# Patient Record
Sex: Female | Born: 1975
Health system: Southern US, Community
[De-identification: ages and names within clinical notes are randomized; demographics above are authoritative.]

## PROBLEM LIST (undated history)

## (undated) ENCOUNTER — Ambulatory Visit (HOSPITAL_COMMUNITY): Admission: EM | Payer: 59 | Source: Home / Self Care

## (undated) DIAGNOSIS — Z973 Presence of spectacles and contact lenses: Secondary | ICD-10-CM

## (undated) DIAGNOSIS — R0600 Dyspnea, unspecified: Secondary | ICD-10-CM

## (undated) DIAGNOSIS — Z87440 Personal history of urinary (tract) infections: Secondary | ICD-10-CM

## (undated) DIAGNOSIS — E221 Hyperprolactinemia: Secondary | ICD-10-CM

## (undated) DIAGNOSIS — Z8669 Personal history of other diseases of the nervous system and sense organs: Secondary | ICD-10-CM

## (undated) DIAGNOSIS — Z87898 Personal history of other specified conditions: Secondary | ICD-10-CM

## (undated) DIAGNOSIS — G43909 Migraine, unspecified, not intractable, without status migrainosus: Secondary | ICD-10-CM

## (undated) DIAGNOSIS — R0609 Other forms of dyspnea: Secondary | ICD-10-CM

## (undated) DIAGNOSIS — D352 Benign neoplasm of pituitary gland: Secondary | ICD-10-CM

## (undated) DIAGNOSIS — E7800 Pure hypercholesterolemia, unspecified: Secondary | ICD-10-CM

## (undated) DIAGNOSIS — I10 Essential (primary) hypertension: Secondary | ICD-10-CM

## (undated) DIAGNOSIS — Z87442 Personal history of urinary calculi: Secondary | ICD-10-CM

---

## 2013-06-13 ENCOUNTER — Emergency Department (HOSPITAL_COMMUNITY)
Admission: EM | Admit: 2013-06-13 | Discharge: 2013-06-13 | Disposition: A | Discharge status: Home / Self Care | Payer: Self-pay | Attending: Emergency Medicine | Admitting: Emergency Medicine

## 2013-06-13 ENCOUNTER — Encounter (HOSPITAL_COMMUNITY): Payer: Self-pay | Admitting: Emergency Medicine

## 2013-06-13 DIAGNOSIS — Z8669 Personal history of other diseases of the nervous system and sense organs: Secondary | ICD-10-CM | POA: Insufficient documentation

## 2013-06-13 DIAGNOSIS — Z8639 Personal history of other endocrine, nutritional and metabolic disease: Secondary | ICD-10-CM | POA: Insufficient documentation

## 2013-06-13 DIAGNOSIS — G43909 Migraine, unspecified, not intractable, without status migrainosus: Secondary | ICD-10-CM | POA: Insufficient documentation

## 2013-06-13 DIAGNOSIS — Z862 Personal history of diseases of the blood and blood-forming organs and certain disorders involving the immune mechanism: Secondary | ICD-10-CM | POA: Insufficient documentation

## 2013-06-13 DIAGNOSIS — R112 Nausea with vomiting, unspecified: Secondary | ICD-10-CM

## 2013-06-13 DIAGNOSIS — F17200 Nicotine dependence, unspecified, uncomplicated: Secondary | ICD-10-CM | POA: Insufficient documentation

## 2013-06-13 HISTORY — DX: Pure hypercholesterolemia, unspecified: E78.00

## 2013-06-13 HISTORY — DX: Migraine, unspecified, not intractable, without status migrainosus: G43.909

## 2013-06-13 MED ORDER — TRAMADOL HCL 50 MG PO TABS: 50.0000 mg | ORAL_TABLET | Freq: Four times a day (QID) | ORAL | Status: DC | PRN

## 2013-06-13 MED ORDER — PROCHLORPERAZINE EDISYLATE 5 MG/ML IJ SOLN
10.0000 mg | Freq: Once | INTRAMUSCULAR | Status: AC
  Administered 2013-06-13: 10 mg via INTRAVENOUS
  Filled 2013-06-13: qty 2

## 2013-06-13 MED ORDER — DIPHENHYDRAMINE HCL 50 MG/ML IJ SOLN
25.0000 mg | Freq: Once | INTRAMUSCULAR | Status: AC
  Administered 2013-06-13: 25 mg via INTRAVENOUS
  Filled 2013-06-13: qty 1

## 2013-06-13 MED ORDER — PROMETHAZINE HCL 25 MG PO TABS
25.0000 mg | ORAL_TABLET | Freq: Four times a day (QID) | ORAL | Status: DC | PRN
Start: 2013-06-13 — End: 2014-02-13

## 2013-06-13 MED ORDER — SODIUM CHLORIDE 0.9 % IV BOLUS (SEPSIS)
1000.0000 mL | INTRAVENOUS | Status: AC
  Administered 2013-06-13: 1000 mL via INTRAVENOUS

## 2013-06-13 MED ORDER — ACETAMINOPHEN 500 MG PO TABS
1000.0000 mg | ORAL_TABLET | Freq: Once | ORAL | Status: AC
  Administered 2013-06-13: 1000 mg via ORAL
  Filled 2013-06-13: qty 2

## 2013-06-13 NOTE — ED Notes (Signed)
Pt has been suffering from migraines for the last couple of months and today has been vomiting.  Pt has been diagnosed with migraines and then they stopped 8-9 months ago and then they restarted around November after moving from [place].

## 2013-06-13 NOTE — ED Notes (Signed)
Blurry vision since Saturday nite

## 2013-06-13 NOTE — ED Provider Notes (Signed)
Medical screening examination/treatment/procedure(s) were performed by non-physician practitioner and as supervising physician I was immediately available for consultation/collaboration.  EKG Interpretation   None         Wandra Arthurs, MD 06/13/13 [phone]

## 2013-06-13 NOTE — ED Provider Notes (Signed)
CSN: [id number]     Arrival date & time 06/13/13  1445 History   First MD Initiated Contact with Patient 06/13/13 1723     Chief Complaint  Patient presents with  . Migraine   (Consider location/radiation/quality/duration/timing/severity/associated sxs/prior Treatment) HPI Pt is a 38yo female with hx of migraines c/o 2 day hx of gradually worsening headache associated with nausea and vomiting that started today.  States HA feels like a typical migraine, aching throbbing, right frontal area, 10/10 worse with light but states she has tried 1000mg  ibuprofen as well as BC powder w/o relief.  States she use to live in [place] where her migraines were managed by her PCP, moved to [place] and did not have any of her "HA" medication but migaines were not a problem for 8-25mo, now she recently moved to [place] area and headaches have started back up again.  Reports increased frequency in migraines since November 2014.  States she also saw another specialist in [place] who drew blood, which showed increased prolactin levels so pt was placed on cholesterol medication as well as medication to help restart her menstrual cycles but states she has not had that medication since she moved either.  Denies head trauma, recent illness, fever, sick contacts or recent travel. No other significant PMH.  Past Medical History  Diagnosis Date  . Migraines   . Hypercholesterolemia   . Seizures     r/t biopsy   Past Surgical History  Procedure Laterality Date  . Cesarean section    . Tubal ligation     No family history on file. History  Substance Use Topics  . Smoking status: Current Every Day Smoker  . Smokeless tobacco: Not on file  . Alcohol Use: No   OB History   Grav Para Term Preterm Abortions TAB SAB Ect Mult Living                 Review of Systems  Constitutional: Negative for fever and chills.  Eyes: Positive for photophobia. Negative for pain and visual disturbance.  Respiratory: Negative for cough and  shortness of breath.   Cardiovascular: Negative for chest pain.  Gastrointestinal: Positive for nausea and vomiting. Negative for abdominal pain, diarrhea and constipation.  Neurological: Positive for headaches. Negative for dizziness, syncope, weakness, light-headedness and numbness.  All other systems reviewed and are negative.    Allergies  Percocet  Home Medications   Current Outpatient Rx  Name  Route  Sig  Dispense  Refill  . Aspirin-Salicylamide-Caffeine (BC HEADACHE POWDER PO)   Oral   Take 1 packet by mouth daily as needed (headache).         . IBUPROFEN PO   Oral   Take 200-800 mg by mouth every 4 (four) hours as needed (headaches).         . promethazine (PHENERGAN) 25 MG tablet   Oral   Take 1 tablet (25 mg total) by mouth every 6 (six) hours as needed for nausea or vomiting.   12 tablet   0   . traMADol (ULTRAM) 50 MG tablet   Oral   Take 1 tablet (50 mg total) by mouth every 6 (six) hours as needed.   15 tablet   0    BP 123/81  Pulse 82  Temp(Src) 97.9 F (36.6 C) (Oral)  Resp 18  SpO2 99% Physical Exam  Nursing note and vitals reviewed. Constitutional: She is oriented to person, place, and time. She appears well-developed and well-nourished. No distress.  Pt lying comfortably in exam bed, NAD.   HENT:  Head: Normocephalic and atraumatic.  Eyes: Conjunctivae and EOM are normal. Pupils are equal, round, and reactive to light. Right eye exhibits no discharge. Left eye exhibits no discharge. No scleral icterus.  Neck: Normal range of motion. Neck supple.  Cardiovascular: Normal rate, regular rhythm and normal heart sounds.   Pulmonary/Chest: Effort normal and breath sounds normal. No respiratory distress. She has no wheezes. She has no rales. She exhibits no tenderness.  Abdominal: Soft. Bowel sounds are normal. She exhibits no distension and no mass. There is no tenderness. There is no rebound and no guarding.  Musculoskeletal: Normal range of  motion.  Neurological: She is alert and oriented to person, place, and time. She has normal strength. No cranial nerve deficit or sensory deficit. Coordination normal. GCS eye subscore is 4. GCS verbal subscore is 5. GCS motor subscore is 6.  CN II-XII in tact, no focal deficit, nl finger to nose coordination. Nl sensation, 5/5 strength in all major muscle groups. Neg romberg and nl gait.  Skin: Skin is warm and dry. She is not diaphoretic.    ED Course  Procedures (including critical care time) Labs Review Labs Reviewed - No data to display Imaging Review No results found.  EKG Interpretation   None       MDM   1. Migraine   2. Nausea & vomiting    Pt is a 38yo female with hx of migraines c/o HA that has gradually worsened over last 2 days. States it feels like a typical migraine but no relief with OTC medications.  Neuro exam: unremarkable. Do not believe imaging or labs needed at this time. Will tx with migraine cocktail: acetaminophen, fluids, compazine, and benadryl then reassess.   6:39 PM Pt sleeping but easily awakened. States she feels comfortable being discharged home. Will provided community resource guide as pt is new to the area.  Rx: phenergan and tramadol.  Migraine info packet provided. Return precautions provided. Pt verbalized understanding and agreement with tx plan.    Noland Fordyce, PA-C 06/13/13 [phone]

## 2013-06-13 NOTE — Discharge Instructions (Signed)
°Emergency Department Resource Guide °1) Find a Doctor and Pay Out of Pocket °Although you won't have to find out who is covered by your insurance plan, it is a good idea to ask around and get recommendations. You will then need to call the office and see if the doctor you have chosen will accept you as a new patient and what types of options they offer for patients who are self-pay. Some doctors offer discounts or will set up payment plans for their patients who do not have insurance, but you will need to ask so you aren't surprised when you get to your appointment. ° °2) Contact Your Local Health Department °Not all health departments have doctors that can see patients for sick visits, but many do, so it is worth a call to see if yours does. If you don't know where your local health department is, you can check in your phone book. The CDC also has a tool to help you locate your state's health department, and many state websites also have listings of all of their local health departments. ° °3) Find a Walk-in Clinic °If your illness is not likely to be very severe or complicated, you may want to try a walk in clinic. These are popping up all over the country in pharmacies, drugstores, and shopping centers. They're usually staffed by nurse practitioners or physician assistants that have been trained to treat common illnesses and complaints. They're usually fairly quick and inexpensive. However, if you have serious medical issues or chronic medical problems, these are probably not your best option. ° °No Primary Care Doctor: °- Call [facility] at  832-8000 - they can help you locate a primary care doctor that  accepts your insurance, provides certain services, etc. °- Physician Referral Service- 1-800-533-3463 ° °Chronic Pain Problems: °Organization         Address  Phone   Notes  °[facility]  (336) 297-2271 Patients need to be referred by their primary care doctor.  ° °Medication  Assistance: °Organization         Address  Phone   Notes  °[place] [facility] [address] °[place], [place] [address] (336) 641-8030 --Must be a resident of [place] °-- Must have NO insurance coverage whatsoever (no Medicaid/ Medicare, etc.) °-- The pt. MUST have a primary care doctor that directs their care regularly and follows them in the community °  °[facility]  (866) 331-1348   °[employer]  (888) 892-1162   ° °Agencies that provide inexpensive medical care: °Organization         Address  Phone   Notes  °[facility]  (336) 832-8035   °[facility]    (336) 832-7272   °[facility] [address] °[place], [place] [address] (336) 832-4777   °[facility] [address], °[place] (336) 271-4999   °[facility]    (336) 373-0678   °[facility]    (336) 272-1050   °[facility] ° [address], [place] Phone:  (336) 832-4444, Fax:  (336) 832-4440 Hours of Operation:  9 am - 6 pm, M-F.  Also accepts Medicaid/Medicare and self-pay.  °[facility] ° [address],  Phone: (336) 832-3150, Fax: (336) 832-3151. Hours of Operation:  8:30 am - 5:30 pm, M-F.  Also accepts Medicaid and self-pay.  °[facility] [place] [address], [place] Phone: (336) 878-6027   °[facility] [address], [place], [place] (336)723-1848, Ext. 123 Mondays & Thursdays: 7-9 AM.  First 15 patients are seen on a first come, first serve basis. °  ° °Medicaid-accepting [place] Providers: ° °Organization         Address  Phone   Notes  °[facility] [address], [place] (336) 641-2100 Also accepts self-pay patients.  °[facility] [address], [place] ° (336) 856-9996   °[facility] [address], [place]  (336) 288-8857   °[facility] [address] [place] [address], [place] (336) 299-7000   °Veita Bland [address], [place]  ° (336) 373-1557 Only accepts [place] Access Medicaid patients after they have their name applied to their card.  ° °Self-Pay (no insurance) in [place]: ° °Organization         Address  Phone   Notes  °Sickle Cell Patients, [facility] [address], [place] (336) 832-1970   °[facility] [address], [place] (336) 832-4400   °[facility] [place] ° [address], [place] (336) 992-4800   °[facility]/Dr. Osei-Bonsu ° [address] [place] [address], [place] or [address], [place] (336) 841-8500 Phone number for both [place] and [place] locations is the same.  °[facility] [address], [place] (336) 299-0000   °[facility] [address] [place] [address], [place] or [address] (336) 852-7530 °(336) 878-2260   °[facility] [address], [place] (336) 350-1642, phone; (336) 294-5005, fax Sees patients 1st and 3rd Saturday of every month.  Must not qualify for public or private insurance (i.e. Medicaid, Medicare, [place] Health Choice, Veterans' Benefits) • Household income should be no more than 200% of the poverty level •The clinic cannot treat you if you are pregnant or think you are pregnant • Sexually transmitted diseases are not treated at the clinic.  ° ° °Dental Care: °Organization         Address  Phone  Notes  °[place] [facility] [address], [place] (336) 641-6152 Accepts children up to age 21 who are enrolled in Medicaid or [place] Health Choice; pregnant women with a Medicaid card; and children who have applied for Medicaid or [place] Health Choice, but were declined, whose parents can pay a reduced fee at time of service.  °[place]  [facility] [place]  [address], [place] (336) 641-7733 Accepts children up to age 21 who are enrolled in Medicaid or [place] Health Choice; pregnant women with a Medicaid card; and children who have applied for Medicaid or  Health Choice, but were declined, whose parents can pay a reduced fee at time of service.  °[facility] ° [address], [place] (336) 641-4533 Patients are seen by appointment only. Walk-ins are not accepted. [facility] will see patients 18 years of age and older. °Monday - Tuesday (8am-5pm) °Most Wednesdays (8:30-5pm) °$30 per visit, cash only  °[facility] ° [address], [place] (336) 641-4533 Patients are seen by appointment only. Walk-ins are not accepted. [facility] will see patients 18 years of age and older. °One   Wednesday Evening (Monthly: Volunteer Based).  $30 per visit, cash only  °[facility]  (919) 537-3737 for adults; Children under age 4, call Graduate Pediatric Dentistry at (919) [phone]. Children aged 4-14, please call (919) [phone] to request a pediatric application. ° Dental services are provided in all areas of dental care including fillings, crowns and bridges, complete and partial dentures, implants, gum treatment, root canals, and extractions. Preventive care is also provided. Treatment is provided to both adults and children. °Patients are selected via a lottery and there is often a waiting list. °  °[facility] [address], °[place] ° (336) 763-8833 [email or web address] °  °[facility] [address], [place], [place] (336)723-1848, Ext. 123 Second and Fourth Thursday of each month, opens at 6:30 AM; Clinic ends at 9 AM.  Patients are seen on a first-come first-served basis, and a limited number are seen during each clinic.  ° °[facility] ° [address], [place], [place] (336) 723-7904    Eligibility Requirements °You must have lived in [place], [place], or [place] counties for at least the last three months. °  You cannot be eligible for state or federal sponsored healthcare insurance, including Veterans Administration, Medicaid, or Medicare. °  You generally cannot be eligible for healthcare insurance through your employer.  °  How to apply: °Eligibility screenings are held every Tuesday and Wednesday afternoon from 1:00 pm until 4:00 pm. You do not need an appointment for the interview!  °[facility] [address], [place], [place] 336-631-2330   °[facility]  336-342-8273   °[facility]  336-703-3100   °[facility]  336-570-6415   ° °Behavioral Health Resources in the Community: °Intensive Outpatient Programs °Organization         Address  Phone  Notes  °[facility] [address], [place], [place] 336-878-6098   °[facility] [address], [place], [place] 336-832-9800   °[facility] [address], [place], [place] ° 336-882-2125   °[facility] [address],  °[place] 1-800-853-5163 or 336-641-4981   °Substance Abuse Resources °Organization         Address  Phone  Notes  °[facility]  336-882-2125   °[facility]  336-784-9470   °[facility]  336-285-9073   °[facility]  336-845-3988   °Residential & Outpatient Substance Abuse Program  1-800-659-3381   °Psychological Services °Organization         Address  Phone  Notes  °[facility]  336- 832-9600   °[facility]  336- 378-7881   °[facility] [address], [place] 1-800-853-5163 or 336-641-4981   ° °Mobile Crisis Teams °Organization         Address  Phone  Notes  °[facility], Mobile Crisis Care Unit  1-877-626-1772   °Assertive °[facility] ° [address]  [place], [place] 336-834-9664   °Sharon DeEsch [address] °[place] 336-554-5454   ° °Self-Help/Support Groups °Organization         Address  Phone             Notes  °[facility] - variety of support groups  336- 373-1402 Call for more information  °Narcotics Anonymous (NA), [facility] [address], °[place]  2 meetings at this location  ° °  Residential Treatment Programs °Organization         Address  Phone  Notes  °[facility] [address],    °[place]  1-866-801-8205   °[facility] ° [address], [place], [place] 704-293-8524   °[facility] [address], [place] 336-845-3988 Admissions: 8am-3pm M-F  °[facility] [address]    °[place], [place] 336-841-1104   °[facility] [address], [place], [place] 336-379-7146   °[facility] [address]  °[place], [place] 336-285-9073   °[facility] - Intensive Outpatient [address], [place], [place] 336-852-3033   °[facility] [address]  °[place], [place] 1-877-615-2722 or 336-784-9470   °[facility] [address] [place], [place] 336-227-7417 Accepts Medicaid  °[facility] [address]  °[place] 1-800-659-3381 Substance Abuse/Addiction Treatment  ° °[place] Behavioral Health Resources °Organization         Address  Phone  Notes  °[facility]  (888) 581-9988   °Julie Brannon, PhD [address] [place], [place]   (336) 349-5553 or (336) 951-0000   °[facility]   [address] °[place], [place] (336) 349-4454   °[facility] [address], [place], [place] (336) 342-8316 Insurance/Medicaid/sponsorship through [facility]  °[facility] [address]                                    [place], [place] (336) 342-8316 Therapy/tele-psych/case    °[facility] [address]  ° [place],  (336) 349-2233    °Dr. Arfeen  (336) 349-4544   °[facility] [place]  [employer] [place] [facility] 1) [address], [place] °2) [address], [place] °3)  [address], [place] (336) 349-3220 °(336) 342-7768 ° °(336) 342-8140   °[place] [facility] (336) 342-1394 or (336) 342-3537 (After Hours)    ° ° °

## 2013-06-13 NOTE — ED Notes (Signed)
C/o migraine since Saturday with n/v, photophobia. Has history of same. States this is typical migraine. Used to take meds for it but has since then ran out of insurance

## 2013-06-13 NOTE — ED Notes (Signed)
Patient made aware of wait time. Vitals reassessed. No acute distress noted.

## 2014-02-13 ENCOUNTER — Encounter (HOSPITAL_COMMUNITY): Payer: Self-pay | Admitting: Emergency Medicine

## 2014-02-13 ENCOUNTER — Emergency Department (HOSPITAL_COMMUNITY)
Admission: EM | Admit: 2014-02-13 | Discharge: 2014-02-13 | Disposition: A | Discharge status: Home / Self Care | Payer: BC Managed Care – PPO | Attending: Emergency Medicine | Admitting: Emergency Medicine

## 2014-02-13 DIAGNOSIS — R319 Hematuria, unspecified: Secondary | ICD-10-CM

## 2014-02-13 DIAGNOSIS — Z8679 Personal history of other diseases of the circulatory system: Secondary | ICD-10-CM | POA: Diagnosis not present

## 2014-02-13 DIAGNOSIS — Z9851 Tubal ligation status: Secondary | ICD-10-CM | POA: Insufficient documentation

## 2014-02-13 DIAGNOSIS — Z3202 Encounter for pregnancy test, result negative: Secondary | ICD-10-CM | POA: Diagnosis not present

## 2014-02-13 DIAGNOSIS — Z720 Tobacco use: Secondary | ICD-10-CM | POA: Diagnosis not present

## 2014-02-13 DIAGNOSIS — R1013 Epigastric pain: Secondary | ICD-10-CM | POA: Diagnosis present

## 2014-02-13 DIAGNOSIS — Z9889 Other specified postprocedural states: Secondary | ICD-10-CM | POA: Insufficient documentation

## 2014-02-13 DIAGNOSIS — R197 Diarrhea, unspecified: Secondary | ICD-10-CM | POA: Insufficient documentation

## 2014-02-13 DIAGNOSIS — Z791 Long term (current) use of non-steroidal anti-inflammatories (NSAID): Secondary | ICD-10-CM | POA: Insufficient documentation

## 2014-02-13 DIAGNOSIS — Z8639 Personal history of other endocrine, nutritional and metabolic disease: Secondary | ICD-10-CM | POA: Diagnosis not present

## 2014-02-13 LAB — COMPREHENSIVE METABOLIC PANEL
ALT: 9 U/L (ref 0–35)
ANION GAP: 10 (ref 5–15)
AST: 16 U/L (ref 0–37)
Albumin: 3.7 g/dL (ref 3.5–5.2)
Alkaline Phosphatase: 88 U/L (ref 39–117)
BILIRUBIN TOTAL: 0.3 mg/dL (ref 0.3–1.2)
BUN: 8 mg/dL (ref 6–23)
CALCIUM: 9.1 mg/dL (ref 8.4–10.5)
CHLORIDE: 106 meq/L (ref 96–112)
CO2: 26 meq/L (ref 19–32)
Creatinine, Ser: 0.84 mg/dL (ref 0.50–1.10)
GFR, EST NON AFRICAN AMERICAN: 86 mL/min — AB (ref >90)
GLUCOSE: 102 mg/dL — AB (ref 70–99)
Potassium: 4 mEq/L (ref 3.7–5.3)
Sodium: 142 mEq/L (ref 137–147)
Total Protein: 7.4 g/dL (ref 6.0–8.3)

## 2014-02-13 LAB — URINALYSIS, ROUTINE W REFLEX MICROSCOPIC
BILIRUBIN URINE: NEGATIVE
Glucose, UA: NEGATIVE mg/dL
KETONES UR: NEGATIVE mg/dL
Leukocytes, UA: NEGATIVE
NITRITE: NEGATIVE
PH: 6.5 (ref 5.0–8.0)
PROTEIN: NEGATIVE mg/dL
Specific Gravity, Urine: 1.009 (ref 1.005–1.030)
Urobilinogen, UA: 0.2 mg/dL (ref 0.0–1.0)

## 2014-02-13 LAB — URINE MICROSCOPIC-ADD ON

## 2014-02-13 LAB — CBC WITH DIFFERENTIAL/PLATELET
Basophils Absolute: 0 10*3/uL (ref 0.0–0.1)
Basophils Relative: 0 % (ref 0–1)
Eosinophils Absolute: 0 10*3/uL (ref 0.0–0.7)
Eosinophils Relative: 0 % (ref 0–5)
HCT: 36.1 % (ref 36.0–46.0)
HEMOGLOBIN: 12.2 g/dL (ref 12.0–15.0)
LYMPHS ABS: 1.6 10*3/uL (ref 0.7–4.0)
LYMPHS PCT: 23 % (ref 12–46)
MCH: 31.6 pg (ref 26.0–34.0)
MCHC: 33.8 g/dL (ref 30.0–36.0)
MCV: 93.5 fL (ref 78.0–100.0)
MONOS PCT: 7 % (ref 3–12)
Monocytes Absolute: 0.5 10*3/uL (ref 0.1–1.0)
NEUTROS ABS: 4.9 10*3/uL (ref 1.7–7.7)
Neutrophils Relative %: 70 % (ref 43–77)
Platelets: 331 10*3/uL (ref 150–400)
RBC: 3.86 MIL/uL — ABNORMAL LOW (ref 3.87–5.11)
RDW: 12.6 % (ref 11.5–15.5)
WBC: 7 10*3/uL (ref 4.0–10.5)

## 2014-02-13 LAB — POC URINE PREG, ED: Preg Test, Ur: NEGATIVE

## 2014-02-13 LAB — LIPASE, BLOOD: LIPASE: 18 U/L (ref 11–59)

## 2014-02-13 MED ORDER — FAMOTIDINE 20 MG PO TABS: 20.0000 mg | ORAL_TABLET | Freq: Two times a day (BID) | ORAL | Status: DC

## 2014-02-13 MED ORDER — DICYCLOMINE HCL 20 MG PO TABS: 20.0000 mg | ORAL_TABLET | Freq: Two times a day (BID) | ORAL | Status: DC

## 2014-02-13 MED ORDER — SODIUM CHLORIDE 0.9 % IV BOLUS (SEPSIS)
1000.0000 mL | Freq: Once | INTRAVENOUS | Status: AC
  Administered 2014-02-13: 1000 mL via INTRAVENOUS

## 2014-02-13 MED ORDER — KETOROLAC TROMETHAMINE 30 MG/ML IJ SOLN
30.0000 mg | Freq: Once | INTRAMUSCULAR | Status: AC
  Administered 2014-02-13: 30 mg via INTRAVENOUS
  Filled 2014-02-13: qty 1

## 2014-02-13 NOTE — ED Provider Notes (Signed)
Medical screening examination/treatment/procedure(s) were performed by non-physician practitioner and as supervising physician I was immediately available for consultation/collaboration.     Mirna Mires, MD 02/13/14 [phone]

## 2014-02-13 NOTE — Discharge Instructions (Signed)
Read the information below.  Use the prescribed medication as directed.  Please discuss all new medications with your pharmacist.  You may return to the Emergency Department at any time for worsening condition or any new symptoms that concern you.  If you develop high fevers, worsening abdominal pain, uncontrolled vomiting, or are unable to tolerate fluids by mouth, return to the ER for a recheck.    Abdominal Pain Many things can cause abdominal pain. Usually, abdominal pain is not caused by a disease and will improve without treatment. It can often be observed and treated at home. Your health care provider will do a physical exam and possibly order blood tests and X-rays to help determine the seriousness of your pain. However, in many cases, more time must pass before a clear cause of the pain can be found. Before that point, your health care provider may not know if you need more testing or further treatment. HOME CARE INSTRUCTIONS  Monitor your abdominal pain for any changes. The following actions may help to alleviate any discomfort you are experiencing:  Only take over-the-counter or prescription medicines as directed by your health care provider.  Do not take laxatives unless directed to do so by your health care provider.  Try a clear liquid diet (broth, tea, or water) as directed by your health care provider. Slowly move to a bland diet as tolerated. SEEK MEDICAL CARE IF:  You have unexplained abdominal pain.  You have abdominal pain associated with nausea or diarrhea.  You have pain when you urinate or have a bowel movement.  You experience abdominal pain that wakes you in the night.  You have abdominal pain that is worsened or improved by eating food.  You have abdominal pain that is worsened with eating fatty foods.  You have a fever. SEEK IMMEDIATE MEDICAL CARE IF:   Your pain does not go away within 2 hours.  You keep throwing up (vomiting).  Your pain is felt only in  portions of the abdomen, such as the right side or the left lower portion of the abdomen.  You pass bloody or black tarry stools. MAKE SURE YOU:  Understand these instructions.   Will watch your condition.   Will get help right away if you are not doing well or get worse.  Document Released: 02/05/2005 Document Revised: 05/03/2013 Document Reviewed: 01/05/2013 [facility] Patient Information 2015 [place], [place]. This information is not intended to replace advice given to you by your health care provider. Make sure you discuss any questions you have with your health care provider.    Hematuria Hematuria is blood in your urine. It can be caused by a bladder infection, kidney infection, prostate infection, kidney stone, or cancer of your urinary tract. Infections can usually be treated with medicine, and a kidney stone usually will pass through your urine. If neither of these is the cause of your hematuria, further workup to find out the reason may be needed. It is very important that you tell your health care provider about any blood you see in your urine, even if the blood stops without treatment or happens without causing pain. Blood in your urine that happens and then stops and then happens again can be a symptom of a very serious condition. Also, pain is not a symptom in the initial stages of many urinary cancers. HOME CARE INSTRUCTIONS   Drink lots of fluid, 3-4 quarts a day. If you have been diagnosed with an infection, cranberry juice is especially recommended,  in addition to large amounts of water.  Avoid caffeine, tea, and carbonated beverages because they tend to irritate the bladder.  Avoid alcohol because it may irritate the prostate.  Take all medicines as directed by your health care provider.  If you were prescribed an antibiotic medicine, finish it all even if you start to feel better.  If you have been diagnosed with a kidney stone, follow your health care provider's  instructions regarding straining your urine to catch the stone.  Empty your bladder often. Avoid holding urine for long periods of time.  After a bowel movement, women should cleanse front to back. Use each tissue only once.  Empty your bladder before and after sexual intercourse if you are a female. SEEK MEDICAL CARE IF:  You develop back pain.  You have a fever.  You have a feeling of sickness in your stomach (nausea) or vomiting.  Your symptoms are not better in 3 days. Return sooner if you are getting worse. SEEK IMMEDIATE MEDICAL CARE IF:   You develop severe vomiting and are unable to keep the medicine down.  You develop severe back or abdominal pain despite taking your medicines.  You begin passing a large amount of blood or clots in your urine.  You feel extremely weak or faint, or you pass out. MAKE SURE YOU:   Understand these instructions.  Will watch your condition.  Will get help right away if you are not doing well or get worse. Document Released: 04/28/2005 Document Revised: 09/12/2013 Document Reviewed: 12/27/2012 [facility] Patient Information 2015 [place], [place]. This information is not intended to replace advice given to you by your health care provider. Make sure you discuss any questions you have with your health care provider.    Emergency Department Resource Guide 1) Find a Doctor and Pay Out of Pocket Although you won't have to find out who is covered by your insurance plan, it is a good idea to ask around and get recommendations. You will then need to call the office and see if the doctor you have chosen will accept you as a new patient and what types of options they offer for patients who are self-pay. Some doctors offer discounts or will set up payment plans for their patients who do not have insurance, but you will need to ask so you aren't surprised when you get to your appointment.  2) Contact Your Local Health Department Not all health  departments have doctors that can see patients for sick visits, but many do, so it is worth a call to see if yours does. If you don't know where your local health department is, you can check in your phone book. The CDC also has a tool to help you locate your state's health department, and many state websites also have listings of all of their local health departments.  3) Find a [facility] If your illness is not likely to be very severe or complicated, you may want to try a walk in clinic. These are popping up all over the country in pharmacies, drugstores, and shopping centers. They're usually staffed by nurse practitioners or physician assistants that have been trained to treat common illnesses and complaints. They're usually fairly quick and inexpensive. However, if you have serious medical issues or chronic medical problems, these are probably not your best option.  No Primary Care Doctor: - Call Health Connect at  [phone] - they can help you locate a primary care doctor that  accepts your insurance, provides  certain services, etc. - Physician Referral Service- [phone]  Chronic Pain Problems: Organization         Address  Phone   Notes  [facility]  [phone] Patients need to be referred by their primary care doctor.   Medication Assistance: Organization         Address  Phone   Notes  [facility] Medication [facility] [place] [place], Karlina Suares [place] [address] [phone] --Must be a resident of [facility] -- Must have NO insurance coverage whatsoever (no Medicaid/ Medicare, etc.) -- The pt. MUST have a primary care doctor that directs their care regularly and follows them in the community   MedAssist  [phone]   [employer]  [phone]    Agencies that provide inexpensive medical care: Organization         Address  Phone   Notes  [place]  [phone]   [facility]     [phone]   [facility] [place], [place] [address] [phone]   [place] [address] [place] [phone]   Planned Parenthood    [phone]   [facility]    [phone]   [place] and [place], [place] Phone:  [phone], Fax:  [phone] Hours of Operation:  9 am - 6 pm, M-F.  Also accepts Medicaid/Medicare and self-pay.  [facility] [place] [address], [address], [place] Phone: [phone], Fax: [phone]. Hours of Operation:  8:30 am - 5:30 pm, M-F.  Also accepts Medicaid and self-pay.  [facility] [address], [place] Phone: [phone]   [place], [place], [place] [phone], Ext. [phone] Mondays & Thursdays: 7-9 AM.  First 15 patients are seen on a first come, first serve basis.    [place] Providers:  Organization         Address  Phone   Notes  [facility] [address], [place] [phone] Also accepts self-pay patients.  [facility] [address] [facility] [address], [place]  [phone]   [place], [address], [place] [phone]   [facility] [address] [place] [phone]   Lucianne Lei [address], [place]   [phone] Only accepts [place] Access [place] patients after they have their name applied to their card.   Self-Pay (no insurance) in [facility]:  Organization         Address  Phone   Notes  Sickle Cell Patients, [facility] [place] [phone]   [facility] [place] [phone]   [facility] [place]  [place], [place], [place] [phone]     [facility]/Dr. Osei-Bonsu  [address] [place] or [address], [place] [phone] Phone number for both [place] and [place] locations is the same.  Urgent Medical and [facility] [address] [facility] [phone]   [facility] [address] [place] or [address] [phone]   [facility]  [address] [place] [phone], phone; [phone], fax Sees patients 1st and 3rd Saturday of every month.  Must not qualify for public or private insurance (i.e. Medicaid, Medicare, [place] Health Choice, Veterans' Benefits)  Household income should be no more than 200% of the poverty level The clinic cannot treat you if you are pregnant or think you are pregnant  Sexually transmitted diseases are not treated at the clinic.    Dental Care: Organization         Address  Phone  Notes  [facility] [place] [phone] Accepts children up to age 31 who are enrolled in [place] or [place]; pregnant women with a Medicaid card; and children who have applied for Medicaid or [place] Health Choice, but were declined, whose parents can pay a reduced fee at time of service.  [facility]  [address], [place] [phone] Accepts children up to age 42 who are enrolled in [place] or [place]; pregnant women with a Medicaid card; and children who have applied for Medicaid or [place] Health Choice, but were declined, whose parents can pay a reduced fee at time of service.  [facility]  [place] [phone] Patients are seen by appointment only. Walk-ins are not accepted. [place] will see patients 28 years of age and older. Monday - Tuesday (8am-5pm) Most Wednesdays (8:30-5pm) $30 per visit,  cash only  [facility] Adult [facility] Access PROGRAM  [address], [facility] [phone] Patients are seen by appointment only. Walk-ins are not accepted. [place] will see patients 82 years of age and older. One Wednesday Evening (Monthly: Volunteer Based).  $30 per visit, cash only  [place]  [phone] for adults; Children under age 4, call Graduate Pediatric Dentistry at [phone]. Children aged 43-14, please call [phone] to request a pediatric application.  Dental services are provided in all areas of dental care including fillings, crowns and bridges, complete and partial dentures, implants, gum treatment, root canals, and extractions. Preventive care is also provided. Treatment is provided to both adults and children. Patients are selected via a lottery and there is often a waiting list.   [facility] [address], [place]  [phone] [email or web address]   [facility] [address] [place], [place] [phone], Ext. [phone] Second and Fourth Thursday of each month, opens at 6:30 AM; Clinic ends at 9 AM.  Patients are seen on a first-come first-served basis, and a limited number are seen during each clinic.   [facility]  [address] [place] Danker [place], [place] [phone]   Eligibility Requirements You must have lived in [place], [place], or [place] counties for at least the last three months.   You cannot be eligible for state or federal sponsored [employer], including [employer], [place], or [employer].   You generally cannot be eligible for healthcare insurance through your employer.    How to apply: Eligibility screenings are held every Tuesday and Wednesday afternoon from 1:00 pm until 4:00 pm. You do not need an appointment for the interview!  [facility] [address], [place], [place]   [place]   [place]  [place]  [phone]  Behavioral Health Resources in the Community: Intensive Outpatient Programs Organization         Address  Phone  Notes  [place]. [address], [place], [place] [phone]   [facility] [address] [place], [place]   ADS: Alcohol & Drug Svcs [address], [place], [place]   [place] [address]  [place], [place] or [phone]   Substance Abuse Resources Organization         Address  Phone  Notes  Alcohol and Drug Services  [phone]   [place]  [phone]   [facility] [place]   Chinita Pester  [phone]   Residential & Outpatient Substance Abuse Program  [phone]   Psychological Services Organization         Address  Phone  Notes  [facility] [place]  [place]  [phone]   [place] [address] [place] or [phone]    Mobile Crisis Teams Organization         Address  Phone  Notes  [facility], Mobile Crisis Care Unit  [phone]   [facility]ive Psychotherapeutic Services  [address] [place], [place]   [place] [address], [place] [phone]    Self-Help/Support Groups Organization         Address  Phone             Notes  [place]. of [place] - variety of support groups  [place] Call for more information  Narcotics Anonymous (NA), [facility] [address], [employer] [place]  2 meetings at this location   Special educational needs teacher         Address  Phone  Notes  [facility] [place],    [place]  [phone]   [facility]  [address] [place] [address], [place], [place]    [place], [place] [phone] Admissions: 8am-3pm M-F  Incentives Substance [place] [address]    [place], [place] [phone]   [facility] [address] [place], [place], [place]   [facility] [address]  [place], [place]   [facility] Programs - Intensive Outpatient [address], [place], [place]   [facility] ([place] [address].,  [place], [place] [phone] or [phone]   [facility] Services (RTS) [address] [place], [place] Accepts Medicaid  [facility] [place] [address],  [place] [phone] Substance Abuse/Addiction Treatment   [facility] Organization         Address  Phone  Notes  [facility]  [phone]   Domenic Schwab, PhD [address] [place]lis [place], [place]   [phone] or [phone]   [place], [place] [phone]   [place] [address] [place], [place] [phone] Insurance/Medicaid/sponsorship through [employer] and Families [address], [place]                                    [place], [place] [phone] [place],  [place] [phone]    Dr. Adele Schilder  [phone]   [facility] Dept. 1) [address], [place] 2) [place] 3)  [address], [place] [phone]  [phone]   [place] [phone] or [phone] (After Hours)

## 2014-02-13 NOTE — ED Provider Notes (Signed)
CSN: [id number]     Arrival date & time 02/13/14  1150 History   First MD Initiated Contact with Patient 02/13/14 1201     Chief Complaint  Patient presents with  . Abdominal Pain     (Consider location/radiation/quality/duration/timing/severity/associated sxs/prior Treatment) The history is provided by the patient.    Pt p/w abdominal pain that began yesterday morning, intermittent, sharp, epigastric/periumbilical, occurs every 30 minutes or so, lasts 5-10 minutes at a time.  Currently pain 7/10.  Associated nausea and diarrhea.  8-9 loose stools yesterday, 2 today.  Also c/o dark urine.  Denies fever, vomiting, CP, SOB, dysuria, urinary frequency or urgency, abnormal vaginal discharge.  LMP June, periods are always irregular.  Has taken ibuprofen without improvement.  Has had abnormal EGD in the past, defined as "belly inflamed"  Past abdominal surgeries: c-section, tubal ligation.   Past Medical History  Diagnosis Date  . Migraines   . Hypercholesterolemia   . Seizures     r/t biopsy   Past Surgical History  Procedure Laterality Date  . Cesarean section    . Tubal ligation     No family history on file. History  Substance Use Topics  . Smoking status: Current Every Day Smoker  . Smokeless tobacco: Not on file  . Alcohol Use: No   OB History   Grav Para Term Preterm Abortions TAB SAB Ect Mult Living                 Review of Systems  All other systems reviewed and are negative.     Allergies  Percocet  Home Medications   Prior to Admission medications   Medication Sig Start Date End Date Taking? Authorizing Provider  ibuprofen (ADVIL,MOTRIN) 200 MG tablet Take 1,600 mg by mouth every 6 (six) hours as needed for headache or moderate pain.   Yes Historical Provider, MD   BP 122/86  Pulse 79  Temp(Src) 99.1 F (37.3 C) (Oral)  Resp 17  SpO2 100% Physical Exam  Nursing note and vitals reviewed. Constitutional: She appears well-developed and well-nourished.  No distress.  HENT:  Head: Normocephalic and atraumatic.  Neck: Neck supple.  Cardiovascular: Normal rate and regular rhythm.   Pulmonary/Chest: Effort normal and breath sounds normal. No respiratory distress. She has no wheezes. She has no rales.  Abdominal: Soft. She exhibits no distension. There is tenderness in the epigastric area. There is no rebound, no guarding and no CVA tenderness.  Neurological: She is alert.  Skin: She is not diaphoretic.    ED Course  Procedures (including critical care time) Labs Review Labs Reviewed  CBC WITH DIFFERENTIAL - Abnormal; Notable for the following:    RBC 3.86 (*)    All other components within normal limits  COMPREHENSIVE METABOLIC PANEL - Abnormal; Notable for the following:    Glucose, Bld 102 (*)    GFR calc non Af Amer 86 (*)    All other components within normal limits  URINALYSIS, ROUTINE W REFLEX MICROSCOPIC - Abnormal; Notable for the following:    Hgb urine dipstick MODERATE (*)    All other components within normal limits  URINE MICROSCOPIC-ADD ON - Abnormal; Notable for the following:    Squamous Epithelial / LPF MANY (*)    Bacteria, UA FEW (*)    All other components within normal limits  LIPASE, BLOOD  POC URINE PREG, ED    Imaging Review No results found.   EKG Interpretation None      2:44 PM  Repeat abdominal exam benign.  Nontender.    MDM   Final diagnoses:  Epigastric pain  Diarrhea  Hematuria    Afebrile nontoxic pain with abdominal pain, nausea, diarrhea x 2 days.  Abdominal exam benign.  Labs, UA, urine preg remarkable only for hematuria.  No urinary symptoms.  Symptoms not consistent with kidney stone.  Pt new to area, has had abnormal EGD gastritis vs gerd in the past, not currently on medications.  Pt drinks alcohol, smokes cigarettes, eats late at night, and has large amount of stress currently.  D/C home with PCP resources, pepcid, bentyl.  Discussed result, findings, treatment, and follow up   with patient.  Pt given return precautions.  Pt verbalizes understanding and agrees with plan.        Clayton Bibles, PA-C 02/13/14 1528

## 2014-02-13 NOTE — ED Notes (Signed)
le4ft upper quad pain since yesterday some nausea and sob al;so having lower abd pain denies injury vag d/c or dysuria

## 2014-02-13 NOTE — ED Notes (Signed)
Pt alert and oriented at discharge.  Pt verbalized understanding of discharge instructions.

## 2015-09-03 ENCOUNTER — Encounter (HOSPITAL_COMMUNITY): Payer: Self-pay | Admitting: *Deleted

## 2015-09-03 ENCOUNTER — Emergency Department (HOSPITAL_COMMUNITY)
Admission: EM | Admit: 2015-09-03 | Discharge: 2015-09-03 | Disposition: A | Discharge status: Home / Self Care | Payer: Self-pay | Attending: Emergency Medicine | Admitting: Emergency Medicine

## 2015-09-03 DIAGNOSIS — F17200 Nicotine dependence, unspecified, uncomplicated: Secondary | ICD-10-CM | POA: Insufficient documentation

## 2015-09-03 DIAGNOSIS — R109 Unspecified abdominal pain: Secondary | ICD-10-CM | POA: Insufficient documentation

## 2015-09-03 LAB — URINALYSIS, ROUTINE W REFLEX MICROSCOPIC
Bilirubin Urine: NEGATIVE
Glucose, UA: NEGATIVE mg/dL
Ketones, ur: NEGATIVE mg/dL
LEUKOCYTES UA: NEGATIVE
Nitrite: NEGATIVE
Protein, ur: NEGATIVE mg/dL
SPECIFIC GRAVITY, URINE: 1.024 (ref 1.005–1.030)
pH: 5.5 (ref 5.0–8.0)

## 2015-09-03 LAB — URINE MICROSCOPIC-ADD ON

## 2015-09-03 LAB — POC URINE PREG, ED: PREG TEST UR: NEGATIVE

## 2015-09-03 NOTE — ED Notes (Signed)
The pt has had symptoms of being preg for 2 weeks  She had her tubes tied but she has a movement sensation in her abd that feels the same of her other 4 neg preg tests she did at home  lmp feb she wants to know if she is preg

## 2015-09-03 NOTE — ED Notes (Signed)
Pt stated that she was going to [place]

## 2015-09-18 ENCOUNTER — Emergency Department (HOSPITAL_COMMUNITY)
Admission: EM | Admit: 2015-09-18 | Discharge: 2015-09-18 | Disposition: A | Discharge status: Home / Self Care | Payer: 59 | Attending: Emergency Medicine | Admitting: Emergency Medicine

## 2015-09-18 ENCOUNTER — Encounter (HOSPITAL_COMMUNITY): Payer: Self-pay | Admitting: Emergency Medicine

## 2015-09-18 DIAGNOSIS — F17200 Nicotine dependence, unspecified, uncomplicated: Secondary | ICD-10-CM | POA: Diagnosis not present

## 2015-09-18 DIAGNOSIS — Z79899 Other long term (current) drug therapy: Secondary | ICD-10-CM | POA: Diagnosis not present

## 2015-09-18 DIAGNOSIS — Z791 Long term (current) use of non-steroidal anti-inflammatories (NSAID): Secondary | ICD-10-CM | POA: Insufficient documentation

## 2015-09-18 DIAGNOSIS — R109 Unspecified abdominal pain: Secondary | ICD-10-CM | POA: Diagnosis present

## 2015-09-18 DIAGNOSIS — E7800 Pure hypercholesterolemia, unspecified: Secondary | ICD-10-CM | POA: Insufficient documentation

## 2015-09-18 DIAGNOSIS — N644 Mastodynia: Secondary | ICD-10-CM | POA: Insufficient documentation

## 2015-09-18 LAB — COMPREHENSIVE METABOLIC PANEL
ALK PHOS: 83 U/L (ref 38–126)
ALT: 17 U/L (ref 14–54)
ANION GAP: 8 (ref 5–15)
AST: 18 U/L (ref 15–41)
Albumin: 4.2 g/dL (ref 3.5–5.0)
BILIRUBIN TOTAL: 0.6 mg/dL (ref 0.3–1.2)
BUN: 19 mg/dL (ref 6–20)
CALCIUM: 9.4 mg/dL (ref 8.9–10.3)
CO2: 27 mmol/L (ref 22–32)
Chloride: 104 mmol/L (ref 101–111)
Creatinine, Ser: 1.09 mg/dL — ABNORMAL HIGH (ref 0.44–1.00)
GFR calc Af Amer: >60 mL/min (ref >60)
Glucose, Bld: 96 mg/dL (ref 65–99)
Potassium: 3.7 mmol/L (ref 3.5–5.1)
Sodium: 139 mmol/L (ref 135–145)
Total Protein: 7.6 g/dL (ref 6.5–8.1)

## 2015-09-18 LAB — CBC
HCT: 36.4 % (ref 36.0–46.0)
Hemoglobin: 12.4 g/dL (ref 12.0–15.0)
MCH: 32 pg (ref 26.0–34.0)
MCHC: 34.1 g/dL (ref 30.0–36.0)
MCV: 94.1 fL (ref 78.0–100.0)
PLATELETS: 345 10*3/uL (ref 150–400)
RBC: 3.87 MIL/uL (ref 3.87–5.11)
RDW: 13.1 % (ref 11.5–15.5)
WBC: 8.8 10*3/uL (ref 4.0–10.5)

## 2015-09-18 LAB — URINALYSIS, ROUTINE W REFLEX MICROSCOPIC
BILIRUBIN URINE: NEGATIVE
Glucose, UA: NEGATIVE mg/dL
Ketones, ur: NEGATIVE mg/dL
Leukocytes, UA: NEGATIVE
NITRITE: NEGATIVE
Protein, ur: NEGATIVE mg/dL
SPECIFIC GRAVITY, URINE: 1.023 (ref 1.005–1.030)
pH: 6 (ref 5.0–8.0)

## 2015-09-18 LAB — URINE MICROSCOPIC-ADD ON

## 2015-09-18 LAB — LIPASE, BLOOD: Lipase: 25 U/L (ref 11–51)

## 2015-09-18 LAB — I-STAT BETA HCG BLOOD, ED (MC, WL, AP ONLY)

## 2015-09-18 NOTE — ED Notes (Signed)
Pt c/o low and periumbilical pain and pressure, nausea worse in mornings, breast tenderness, galactorrhea x 1 month. No menstrual period x 2 months.

## 2015-09-18 NOTE — ED Provider Notes (Signed)
CSN: [id number]     Arrival date & time 09/18/15  1505 History   First MD Initiated Contact with Patient 09/18/15 1642     Chief Complaint  Patient presents with  . Abdominal Pain     (Consider location/radiation/quality/duration/timing/severity/associated sxs/prior Treatment) HPI  40 year old female presents with multiple weeks (at least 2) of intermittent nausea with some gagging, bilateral breast soreness and no period in 2 months (since march). Feels like prior pregnancies. 4 negative home pregnancy tests. Went to [facility] 2 weeks ago but left prior to being seen. However urine preg was negative. Had clear discharge (mild) from right nipple yesterday. No swelling of breasts. No abd pain but has "tightness" and pressure like something's growing. No vaginal discharge or dysuria. Chronic headaches from migraines but no headaches over past 3 weeks. No vision changes. States she used to have elevated prolactin and was on medicine but stopped due to health insurance issues. Now has health insurance but no PCP.  Past Medical History  Diagnosis Date  . Migraines   . Hypercholesterolemia   . Seizures ([place])     r/t biopsy   Past Surgical History  Procedure Laterality Date  . Cesarean section    . Tubal ligation     History reviewed. No pertinent family history. Social History  Substance Use Topics  . Smoking status: Current Every Day Smoker  . Smokeless tobacco: None  . Alcohol Use: No   OB History    No data available     Review of Systems  Constitutional: Negative for fever.  Eyes: Negative for visual disturbance.  Respiratory: Negative for shortness of breath.   Cardiovascular: Negative for chest pain.  Gastrointestinal: Positive for nausea. Negative for vomiting and abdominal pain.       Pressure/tightness in abd  Genitourinary: Negative for dysuria, vaginal bleeding and vaginal discharge.       Bilateral breast pain, clear drainage  Neurological: Negative for headaches.  All  other systems reviewed and are negative.     Allergies  Percocet  Home Medications   Prior to Admission medications   Medication Sig Start Date End Date Taking? Authorizing Provider  ibuprofen (ADVIL,MOTRIN) 200 MG tablet Take 1,600 mg by mouth every 6 (six) hours as needed for headache or moderate pain.   Yes Historical Provider, MD  dicyclomine (BENTYL) 20 MG tablet Take 1 tablet (20 mg total) by mouth 2 (two) times daily. Patient not taking: Reported on 09/18/2015 02/13/14   Clayton Bibles, PA-C  famotidine (PEPCID) 20 MG tablet Take 1 tablet (20 mg total) by mouth 2 (two) times daily. Patient not taking: Reported on 09/18/2015 02/13/14   Clayton Bibles, PA-C   BP 158/102 mmHg  Pulse 72  Temp(Src) 98.2 F (36.8 C) (Oral)  Resp 16  SpO2 100%  LMP 08/03/2015 Physical Exam  Constitutional: She is oriented to person, place, and time. She appears well-developed and well-nourished.  HENT:  Head: Normocephalic and atraumatic.  Right Ear: External ear normal.  Left Ear: External ear normal.  Nose: Nose normal.  Eyes: EOM are normal. Pupils are equal, round, and reactive to light. Right eye exhibits no discharge. Left eye exhibits no discharge.  Neck: Neck supple.  Cardiovascular: Normal rate, regular rhythm and normal heart sounds.   Pulmonary/Chest: Effort normal and breath sounds normal. Right breast exhibits no inverted nipple, no mass, no nipple discharge, no skin change and no tenderness. Left breast exhibits no inverted nipple, no mass, no nipple discharge, no skin change  and no tenderness. Breasts are symmetrical.  Abdominal: Soft. She exhibits no distension. There is no tenderness.  Neurological: She is alert and oriented to person, place, and time.  CN 2-12 grossly intact. 5/5 strength in all 4 extremities. Grossly normal sensation  Skin: Skin is warm and dry.  Nursing note and vitals reviewed.   ED Course  Procedures (including critical care time) Labs Review Labs Reviewed   COMPREHENSIVE METABOLIC PANEL - Abnormal; Notable for the following:    Creatinine, Ser 1.09 (*)    All other components within normal limits  URINALYSIS, ROUTINE W REFLEX MICROSCOPIC (NOT AT [facility]) - Abnormal; Notable for the following:    Hgb urine dipstick MODERATE (*)    All other components within normal limits  URINE MICROSCOPIC-ADD ON - Abnormal; Notable for the following:    Squamous Epithelial / LPF 0-5 (*)    Bacteria, UA MANY (*)    All other components within normal limits  LIPASE, BLOOD  CBC  I-STAT BETA HCG BLOOD, ED ([facility], [facility], [facility] ONLY)    Imaging Review No results found. I have personally reviewed and evaluated these images and lab results as part of my medical decision-making.   EKG Interpretation None      MDM   Final diagnoses:  Breast pain in female    Patient has no abdominal tenderness or current pain. No head/neuro symptoms. She is not pregnant. History of high prolactin in past, but this is not something I can acutely assess in ED. She is stable. D/w GYN, Dr. Harolyn Rutherford, who recommends f/u at [facility] and subsequent referral to endocrine. She is in agreement with this plan. Discussed return precautions.    Sherwood Gambler, MD 09/18/15 [phone]

## 2015-09-18 NOTE — Progress Notes (Signed)
Patient listed as having [employer] insurance without a pcp.  EDCM provided patient with list of pcps whoa ccept [employer] insurance within a 20 mile radius of patient's zip code.  Patient thankful for resources.  No further EDCM needs at this time.

## 2016-02-24 ENCOUNTER — Encounter (HOSPITAL_COMMUNITY): Payer: Self-pay | Admitting: Emergency Medicine

## 2016-02-24 ENCOUNTER — Emergency Department (HOSPITAL_COMMUNITY)
Admission: EM | Admit: 2016-02-24 | Discharge: 2016-02-24 | Disposition: A | Discharge status: Home / Self Care | Payer: 59 | Attending: Emergency Medicine | Admitting: Emergency Medicine

## 2016-02-24 DIAGNOSIS — J069 Acute upper respiratory infection, unspecified: Secondary | ICD-10-CM | POA: Insufficient documentation

## 2016-02-24 DIAGNOSIS — F17200 Nicotine dependence, unspecified, uncomplicated: Secondary | ICD-10-CM | POA: Diagnosis not present

## 2016-02-24 DIAGNOSIS — J029 Acute pharyngitis, unspecified: Secondary | ICD-10-CM | POA: Diagnosis present

## 2016-02-24 MED ORDER — OXYMETAZOLINE HCL 0.05 % NA SOLN
1.0000 | Freq: Two times a day (BID) | NASAL | 0 refills | Status: DC
Start: 2016-02-24 — End: 2016-07-10

## 2016-02-24 MED ORDER — BENZONATATE 100 MG PO CAPS: 200.0000 mg | ORAL_CAPSULE | Freq: Two times a day (BID) | ORAL | 0 refills | Status: DC | PRN

## 2016-02-24 NOTE — ED Triage Notes (Signed)
Pt states recently had flu and that was over 1 week ago and now for the last 3 days she started having bilateral ear pain with sore throat.

## 2016-02-24 NOTE — ED Notes (Signed)
Declined W/C at D/C and was escorted to lobby by RN. 

## 2016-02-24 NOTE — Discharge Instructions (Signed)
Take your medications as prescribed. I also recommend taking Tylenol and ibuprofen, alternating between doses every 4-6 hours as prescribed over-the-counter. Continue drinking fluids at home to remain hydrated. Please follow up with a primary care provider from the Resource Guide provided below in 4-5 days if her symptoms have not improved or have worsened. I also recommend following up within the next 1-2 weeks at the primary care clinic to have your blood pressure rechecked. Please return to the Emergency Department if symptoms worsen or new onset of fever, headache, facial swelling, neck stiffness, loss of hearing, difficulty breathing, coughing up blood, and able to swallow resulting in drooling, vomiting, and able to keep fluids down.

## 2016-02-24 NOTE — ED Provider Notes (Signed)
[facility] DEPT Provider Note   CSN: [id number] Arrival date & time: 02/24/16  1032   By signing my name below, I, Tracy Johns, attest that this documentation has been prepared under the direction and in the presence of Tracy Johns. Electronically Signed: Eunice Johns, Scribe. 02/24/16. 12:53 PM.   History   Chief Complaint Chief Complaint  Patient presents with  . Otalgia    HPI HPI Comments: Tracy Johns is a 40 y.o. female who presents to the Emergency Department complaining of intermittent sore throat x 5 days. She states she had flu like symptoms 7 days ago, but those symptoms have cleared up. She reports associated left side ear pain traveling to the right side today, mild intermittent productive cough. She has been taking cough drops with mild relief. She further reports consuming honey and lemon water, mucinex, and ibuprofen, but she has not taken mucinex in a few days. 1 sick contact at home with similar sxs over the past week. She reports that she is not on any medication for HTN but notes she has had intermittent elevated BP over the past year and that she does not have a PCP. The pt denies fever, headache, neck stiffness, drooling, difficulty swallowing or opening her jaw, face or neck swelling, drainage, wheezing, hemoptysis, SOB, CP, vomiting.   Past Medical History:  Diagnosis Date  . Hypercholesterolemia   . Migraines   . Seizures ([place])    r/t biopsy    There are no active problems to display for this patient.   Past Surgical History:  Procedure Laterality Date  . CESAREAN SECTION    . TUBAL LIGATION      OB History    No data available       Home Medications    Prior to Admission medications   Medication Sig Start Date End Date Taking? Authorizing Provider  benzonatate (TESSALON) 100 MG capsule Take 2 capsules (200 mg total) by mouth 2 (two) times daily as needed for cough. 02/24/16   Nona Dell, PA-C  dicyclomine  (BENTYL) 20 MG tablet Take 1 tablet (20 mg total) by mouth 2 (two) times daily. Patient not taking: Reported on 09/18/2015 02/13/14   Clayton Bibles, PA-C  famotidine (PEPCID) 20 MG tablet Take 1 tablet (20 mg total) by mouth 2 (two) times daily. Patient not taking: Reported on 09/18/2015 02/13/14   Clayton Bibles, PA-C  ibuprofen (ADVIL,MOTRIN) 200 MG tablet Take 1,600 mg by mouth every 6 (six) hours as needed for headache or moderate pain.    Historical Provider, MD  oxymetazoline (AFRIN NASAL SPRAY) 0.05 % nasal spray Place 1 spray into both nostrils 2 (two) times daily. Place 1 spray into both nostrils 2 times daily for the next 3 days as needed. Do not use for more than 3 days to prevent rebound rhinorrhea. 02/24/16   Nona Dell, PA-C    Family History No family history on file.  Social History Social History  Substance Use Topics  . Smoking status: Current Every Day Smoker  . Smokeless tobacco: Not on file  . Alcohol use No     Allergies   Percocet [oxycodone-acetaminophen]   Review of Systems Review of Systems  HENT: Positive for ear pain and sore throat. Negative for drooling, ear discharge, facial swelling and trouble swallowing.   Respiratory: Positive for cough (productive). Negative for wheezing.   All other systems reviewed and are negative.    Physical Exam Updated Vital Signs BP (!) 170/114 (BP Location: Right Arm)  Pulse 86   Temp 98 F (36.7 C) (Oral)   Resp 18   SpO2 100%   Physical Exam  Constitutional: She is oriented to person, place, and time. She appears well-developed and well-nourished. No distress.  HENT:  Head: Normocephalic and atraumatic.  Right Ear: No drainage, swelling or tenderness. No mastoid tenderness. Tympanic membrane is not erythematous. A middle ear effusion is present. No decreased hearing is noted.  Left Ear: No drainage, swelling or tenderness. No mastoid tenderness. Tympanic membrane is not erythematous. A middle ear effusion  is present. No decreased hearing is noted.  Nose: Rhinorrhea present. Right sinus exhibits no maxillary sinus tenderness and no frontal sinus tenderness. Left sinus exhibits no maxillary sinus tenderness and no frontal sinus tenderness.  Mouth/Throat: Uvula is midline and mucous membranes are normal. Posterior oropharyngeal erythema present. No oropharyngeal exudate, posterior oropharyngeal edema or tonsillar abscesses. No tonsillar exudate.  No facial or neck swelling  Eyes: Conjunctivae and EOM are normal. Right eye exhibits no discharge. Left eye exhibits no discharge. No scleral icterus.  Neck: Normal range of motion. Neck supple.  Cardiovascular: Normal rate, regular rhythm, normal heart sounds and intact distal pulses.   Pulmonary/Chest: Effort normal and breath sounds normal. No stridor. No respiratory distress. She has no wheezes. She has no rales. She exhibits no tenderness.  Abdominal: Soft. Bowel sounds are normal. She exhibits no distension. There is no tenderness.  Musculoskeletal: Normal range of motion. She exhibits no edema.  Lymphadenopathy:    She has no cervical adenopathy.  Neurological: She is alert and oriented to person, place, and time.  Skin: Skin is warm and dry. She is not diaphoretic.  Nursing note and vitals reviewed.    ED Treatments / Results  DIAGNOSTIC STUDIES: Oxygen Saturation is 100% on RA, normal by my interpretation.    COORDINATION OF CARE: 12:53 PM Will order pain medication and decongestant. Discussed treatment plan with pt at bedside and pt agreed to plan.    Labs (all labs ordered are listed, but only abnormal results are displayed) Labs Reviewed - No data to display  EKG  EKG Interpretation None       Radiology No results found.  Procedures Procedures (including critical care time)  Medications Ordered in ED Medications - No data to display   Initial Impression / Assessment and Plan / ED Course  I have reviewed the triage  vital signs and the nursing notes.  Pertinent labs & imaging results that were available during my care of the patient were reviewed by me and considered in my medical decision making (see chart for details).  Clinical Course    Patients symptoms are consistent with URI, likely viral etiology. Mouth exam revealed mild erythema, do not suspect strep throat. Centor score 1. Lungs CTAB on exam. Discussed that antibiotics are not indicated for viral infections. Pt will be discharged with symptomatic treatment.  Verbalizes understanding and is agreeable with plan. Pt is hemodynamically stable & in NAD prior to dc.   Final Clinical Impressions(s) / ED Diagnoses   Final diagnoses:  Viral upper respiratory tract infection    New Prescriptions Discharge Medication List as of 02/24/2016 12:52 PM    START taking these medications   Details  benzonatate (TESSALON) 100 MG capsule Take 2 capsules (200 mg total) by mouth 2 (two) times daily as needed for cough., Starting Sun 02/24/2016, Print    oxymetazoline (AFRIN NASAL SPRAY) 0.05 % nasal spray Place 1 spray into both nostrils 2 (two)  times daily. Place 1 spray into both nostrils 2 times daily for the next 3 days as needed. Do not use for more than 3 days to prevent rebound rhinorrhea., Starting Sun 02/24/2016, Print        I personally performed the services described in this documentation, which was scribed in my presence. The recorded information has been reviewed and is accurate.    Chesley Noon Dunlap, [place] 02/24/16 1303    Julianne Rice, MD 02/24/16 [phone]

## 2016-03-01 ENCOUNTER — Encounter (HOSPITAL_COMMUNITY): Payer: Self-pay | Admitting: *Deleted

## 2016-03-01 ENCOUNTER — Emergency Department (HOSPITAL_COMMUNITY)
Admission: EM | Admit: 2016-03-01 | Discharge: 2016-03-01 | Disposition: A | Discharge status: Home / Self Care | Payer: 59 | Attending: Emergency Medicine | Admitting: Emergency Medicine

## 2016-03-01 DIAGNOSIS — F17200 Nicotine dependence, unspecified, uncomplicated: Secondary | ICD-10-CM | POA: Diagnosis not present

## 2016-03-01 DIAGNOSIS — J0110 Acute frontal sinusitis, unspecified: Secondary | ICD-10-CM | POA: Insufficient documentation

## 2016-03-01 DIAGNOSIS — H9190 Unspecified hearing loss, unspecified ear: Secondary | ICD-10-CM | POA: Diagnosis present

## 2016-03-01 MED ORDER — AMOXICILLIN-POT CLAVULANATE 875-125 MG PO TABS: 1.0000 | ORAL_TABLET | Freq: Two times a day (BID) | ORAL | 0 refills | Status: DC

## 2016-03-01 NOTE — ED Provider Notes (Signed)
[facility] DEPT Provider Note   CSN: [id number] Arrival date & time: 03/01/16  1729   By signing my name below, I, Dolores Hoose, attest that this documentation has been prepared under the direction and in the presence of non-physician practitioner, Montine Circle, PA-C. Electronically Signed: Dolores Hoose, Scribe. 03/01/2016. 5:44 PM.  History   Chief Complaint Chief Complaint  Patient presents with  . Hearing Loss   The history is provided by the patient. No language interpreter was used.    HPI Comments:  Tracy Johns is a 40 y.o. female with no pertinent PMhx who presents to the Emergency Department complaining of recurring constant unchanged hearing loss secondary to resolved flu symptoms occurring over the past few weeks. Pt states that she was seen previously in the ED for her symptoms where she was given Tessalon Perles and some nasal spray, which she says temporarily relieved her flu symptoms but not her sinus and TM symptoms. She notes that she has noticed some of her symptoms returning over the past few days. Pt reports associated sneezing, coughing, and congestion.   Past Medical History:  Diagnosis Date  . Hypercholesterolemia   . Migraines   . Seizures ([place])    r/t biopsy    There are no active problems to display for this patient.   Past Surgical History:  Procedure Laterality Date  . CESAREAN SECTION    . TUBAL LIGATION      OB History    No data available      Home Medications    Prior to Admission medications   Medication Sig Start Date End Date Taking? Authorizing Provider  benzonatate (TESSALON) 100 MG capsule Take 2 capsules (200 mg total) by mouth 2 (two) times daily as needed for cough. 02/24/16   Nona Dell, PA-C  dicyclomine (BENTYL) 20 MG tablet Take 1 tablet (20 mg total) by mouth 2 (two) times daily. Patient not taking: Reported on 09/18/2015 02/13/14   Clayton Bibles, PA-C  famotidine (PEPCID) 20 MG tablet Take 1 tablet  (20 mg total) by mouth 2 (two) times daily. Patient not taking: Reported on 09/18/2015 02/13/14   Clayton Bibles, PA-C  ibuprofen (ADVIL,MOTRIN) 200 MG tablet Take 1,600 mg by mouth every 6 (six) hours as needed for headache or moderate pain.    Historical Provider, MD  oxymetazoline (AFRIN NASAL SPRAY) 0.05 % nasal spray Place 1 spray into both nostrils 2 (two) times daily. Place 1 spray into both nostrils 2 times daily for the next 3 days as needed. Do not use for more than 3 days to prevent rebound rhinorrhea. 02/24/16   Nona Dell, PA-C    Family History History reviewed. No pertinent family history.  Social History Social History  Substance Use Topics  . Smoking status: Current Every Day Smoker  . Smokeless tobacco: Not on file  . Alcohol use No     Allergies   Percocet [oxycodone-acetaminophen]   Review of Systems Review of Systems  HENT: Positive for congestion, hearing loss and sneezing.   Respiratory: Positive for choking.      Physical Exam Updated Vital Signs BP (!) 154/106 (BP Location: Left Arm)   Pulse 88   Temp 99 F (37.2 C) (Oral)   Resp 20   SpO2 100%   Physical Exam  Constitutional: She is oriented to person, place, and time. She appears well-developed and well-nourished.  HENT:  Head: Normocephalic and atraumatic.  Right Ear: External ear normal.  Left Ear: External ear normal.  Mouth/Throat: Oropharynx is clear and moist. No oropharyngeal exudate.  Swollen, erythematous turbinates, maxillary sinuses tender to palpation Erythema and congestion of bilateral TMs  Eyes: Conjunctivae and EOM are normal. Pupils are equal, round, and reactive to light.  Neck: Normal range of motion. Neck supple.  Cardiovascular: Normal rate, regular rhythm and normal heart sounds.   Pulmonary/Chest: Effort normal and breath sounds normal. No respiratory distress. She has no wheezes. She has no rales. She exhibits no tenderness.  Abdominal: Soft. Bowel sounds are  normal.  Musculoskeletal: Normal range of motion.  Neurological: She is alert and oriented to person, place, and time.  Skin: Skin is warm and dry.  Psychiatric: She has a normal mood and affect. Her behavior is normal. Judgment and thought content normal.  Nursing note and vitals reviewed.     ED Treatments / Results  DIAGNOSTIC STUDIES:  Oxygen Saturation is 100% on RA, normal by my interpretation.    COORDINATION OF CARE:  6:06 PM Discussed treatment plan with pt at bedside which included antibiotics and pt agreed to plan.  Labs (all labs ordered are listed, but only abnormal results are displayed) Labs Reviewed - No data to display  EKG  EKG Interpretation None       Radiology No results found.  Procedures Procedures (including critical care time)  Medications Ordered in ED Medications - No data to display   Initial Impression / Assessment and Plan / ED Course  I have reviewed the triage vital signs and the nursing notes.  Pertinent labs & imaging results that were available during my care of the patient were reviewed by me and considered in my medical decision making (see chart for details).  Clinical Course    Patient with URI symptoms, improved and then worsened.  Now having significant sinus pressure and ear pressure.  Will give augmentin due to double sickening and probable sinus infection.  Final Clinical Impressions(s) / ED Diagnoses   Final diagnoses:  Acute frontal sinusitis, recurrence not specified    New Prescriptions New Prescriptions   No medications on file   I personally performed the services described in this documentation, which was scribed in my presence. The recorded information has been reviewed and is accurate.       Montine Circle, PA-C 03/01/16 1827    Julianne Rice, MD 03/07/16 [phone]

## 2016-03-01 NOTE — ED Triage Notes (Signed)
Pt recently seen on 10/15 for same, had the flu and then bilateral ear pain and hearing loss. Pt was given cough medicine and nose spray with no relief. Pt still feels pressure in both ears and now has cough and sneezing. No acute distress noted at triage.

## 2016-03-01 NOTE — ED Notes (Signed)
Declined W/C at D/C and was escorted to lobby by RN. 

## 2016-03-05 ENCOUNTER — Emergency Department (HOSPITAL_COMMUNITY)
Admission: EM | Admit: 2016-03-05 | Discharge: 2016-03-05 | Disposition: A | Discharge status: Home / Self Care | Payer: 59 | Attending: Emergency Medicine | Admitting: Emergency Medicine

## 2016-03-05 ENCOUNTER — Encounter (HOSPITAL_COMMUNITY): Payer: Self-pay

## 2016-03-05 DIAGNOSIS — F17210 Nicotine dependence, cigarettes, uncomplicated: Secondary | ICD-10-CM | POA: Diagnosis not present

## 2016-03-05 DIAGNOSIS — J40 Bronchitis, not specified as acute or chronic: Secondary | ICD-10-CM | POA: Insufficient documentation

## 2016-03-05 DIAGNOSIS — H9201 Otalgia, right ear: Secondary | ICD-10-CM | POA: Diagnosis present

## 2016-03-05 DIAGNOSIS — H6593 Unspecified nonsuppurative otitis media, bilateral: Secondary | ICD-10-CM | POA: Diagnosis not present

## 2016-03-05 MED ORDER — PREDNISONE 10 MG (21) PO TBPK: 10.0000 mg | ORAL_TABLET | Freq: Every day | ORAL | 0 refills | Status: DC

## 2016-03-05 MED ORDER — FLUTICASONE PROPIONATE 50 MCG/ACT NA SUSP: 2.0000 | Freq: Every day | NASAL | 0 refills | Status: DC

## 2016-03-05 MED ORDER — DIPHENHYDRAMINE HCL 25 MG PO TABS: 25.0000 mg | ORAL_TABLET | Freq: Four times a day (QID) | ORAL | 0 refills | Status: DC

## 2016-03-05 MED ORDER — ALBUTEROL SULFATE HFA 108 (90 BASE) MCG/ACT IN AERS
2.0000 | INHALATION_SPRAY | Freq: Once | RESPIRATORY_TRACT | Status: AC
  Administered 2016-03-05: 2 via RESPIRATORY_TRACT
  Filled 2016-03-05: qty 6.7

## 2016-03-05 MED ORDER — PSEUDOEPHEDRINE HCL 60 MG PO TABS: 60.0000 mg | ORAL_TABLET | Freq: Four times a day (QID) | ORAL | 0 refills | Status: DC | PRN

## 2016-03-05 MED ORDER — AMOXICILLIN-POT CLAVULANATE 875-125 MG PO TABS: 1.0000 | ORAL_TABLET | Freq: Two times a day (BID) | ORAL | 0 refills | Status: DC

## 2016-03-05 NOTE — ED Triage Notes (Addendum)
PT reports her ears are stopped up bilaterally. She reports she has trouble hearing out of her ears. She was prescribed abx and has been taking them. Pt's voice is also hoarse which she reports started yesterday. No redness or swelling noted.

## 2016-03-05 NOTE — ED Provider Notes (Signed)
[facility] DEPT Provider Note   CSN: [id number] Arrival date & time: 03/05/16  1839     History   Chief Complaint Chief Complaint  Patient presents with  . Ear Fullness    HPI Tracy Johns is a 40 y.o. female.  HPI   Patient presents with upper respiratory illness that began 2.5-3 weeks ago.  She began with about 4 days of flu-like illness.  Was seen in ED and was given tessalon perles and motrin, sore throat improved.  She then developed ear pain and was seen again in the ED, prescribed Augmentin.  Has been taking the Augmentin and occasional motrin since then, feels the fullness in her ears has increased and her hearing has become more muffled.  The left ear slightly improved but the right ear has worsened.  She continues to have cough, productive of yellow and nonbloody sputum, does get SOB with walking around.   No further fevers, no sinus pressure, sore throat, chest pain.  Pt is a smoker.   Past Medical History:  Diagnosis Date  . Hypercholesterolemia   . Migraines   . Seizures ([place])    r/t biopsy    There are no active problems to display for this patient.   Past Surgical History:  Procedure Laterality Date  . CESAREAN SECTION    . TUBAL LIGATION      OB History    No data available       Home Medications    Prior to Admission medications   Medication Sig Start Date End Date Taking? Authorizing Provider  benzonatate (TESSALON) 100 MG capsule Take 2 capsules (200 mg total) by mouth 2 (two) times daily as needed for cough. 02/24/16  Yes Nona Dell, PA-C  famotidine (PEPCID) 20 MG tablet Take 1 tablet (20 mg total) by mouth 2 (two) times daily. 02/13/14  Yes Clayton Bibles, PA-C  ibuprofen (ADVIL,MOTRIN) 200 MG tablet Take 1,600 mg by mouth every 6 (six) hours as needed for headache or moderate pain.   Yes Historical Provider, MD  oxymetazoline (AFRIN NASAL SPRAY) 0.05 % nasal spray Place 1 spray into both nostrils 2 (two) times daily. Place 1  spray into both nostrils 2 times daily for the next 3 days as needed. Do not use for more than 3 days to prevent rebound rhinorrhea. 02/24/16  Yes Chesley Noon Nadeau, PA-C  amoxicillin-clavulanate (AUGMENTIN) 875-125 MG tablet Take 1 tablet by mouth every 12 (twelve) hours. To be added on to current treatment 03/05/16   Clayton Bibles, PA-C  dicyclomine (BENTYL) 20 MG tablet Take 1 tablet (20 mg total) by mouth 2 (two) times daily. Patient not taking: Reported on 03/05/2016 02/13/14   Clayton Bibles, PA-C  diphenhydrAMINE (BENADRYL) 25 MG tablet Take 1 tablet (25 mg total) by mouth every 6 (six) hours. 03/05/16   Clayton Bibles, PA-C  fluticasone (FLONASE) 50 MCG/ACT nasal spray Place 2 sprays into both nostrils daily. 03/05/16   Clayton Bibles, PA-C  predniSONE (STERAPRED UNI-PAK 21 TAB) 10 MG (21) TBPK tablet Take 1 tablet (10 mg total) by mouth daily. Day 1: take 6 tabs.  Day 2: 5 tabs  Day 3: 4 tabs  Day 4: 3 tabs  Day 5: 2 tabs  Day 6: 1 tab 03/05/16   Clayton Bibles, PA-C  pseudoephedrine (SUDAFED) 60 MG tablet Take 1 tablet (60 mg total) by mouth every 6 (six) hours as needed for congestion. 03/05/16   Clayton Bibles, PA-C    Family History History reviewed. No pertinent  family history.  Social History Social History  Substance Use Topics  . Smoking status: Current Every Day Smoker    Packs/day: 0.25    Types: Cigarettes  . Smokeless tobacco: Never Used  . Alcohol use Yes     Comment: occ     Allergies   Percocet [oxycodone-acetaminophen]   Review of Systems Review of Systems  Constitutional: Negative for chills and fever.  HENT: Positive for congestion and ear pain. Negative for dental problem, mouth sores, rhinorrhea, sore throat and trouble swallowing.   Eyes: Negative for discharge.  Respiratory: Positive for cough and shortness of breath. Negative for wheezing and stridor.   Cardiovascular: Negative for chest pain.  Musculoskeletal: Negative for neck pain and neck stiffness.      Physical Exam Updated Vital Signs BP 143/88 (BP Location: Right Arm)   Pulse 79   Temp 98.8 F (37.1 C) (Oral)   Resp 18   SpO2 100%   Physical Exam  Constitutional: She appears well-developed and well-nourished. No distress.  HENT:  Head: Normocephalic and atraumatic.  Right Ear: Ear canal normal. Tympanic membrane is bulging. A middle ear effusion is present.  Left Ear: Ear canal normal. Tympanic membrane is bulging. A middle ear effusion is present.  Nose: Mucosal edema present. Right sinus exhibits no maxillary sinus tenderness and no frontal sinus tenderness. Left sinus exhibits no maxillary sinus tenderness and no frontal sinus tenderness.  Mouth/Throat: Uvula is midline and oropharynx is clear and moist. Mucous membranes are not dry. No oropharyngeal exudate, posterior oropharyngeal edema, posterior oropharyngeal erythema or tonsillar abscesses.  Eyes: Conjunctivae are normal.  Neck: Neck supple.  Cardiovascular: Normal rate and regular rhythm.   Pulmonary/Chest: Effort normal and breath sounds normal. No respiratory distress. She has no wheezes. She has no rales.  Intermittent cough   Neurological: She is alert.  Skin: She is not diaphoretic.  Nursing note and vitals reviewed.    ED Treatments / Results  Labs (all labs ordered are listed, but only abnormal results are displayed) Labs Reviewed - No data to display  EKG  EKG Interpretation None       Radiology No results found.  Procedures Procedures (including critical care time)  Medications Ordered in ED Medications  albuterol (PROVENTIL HFA;VENTOLIN HFA) 108 (90 Base) MCG/ACT inhaler 2 puff (2 puffs Inhalation Given 03/05/16 2043)     Initial Impression / Assessment and Plan / ED Course  I have reviewed the triage vital signs and the nursing notes.  Pertinent labs & imaging results that were available during my care of the patient were reviewed by me and considered in my medical decision making  (see chart for details).  Clinical Course   Afebrile, nontoxic patient with almost 3 weeks of URI symptoms.  Has developed OM, treated for bacterial infection.  Continues to have effusions bilaterally, left effusion is clear, right effusion with slightly milky mostly clear effusion.  Likely clearing infection.  Given additional treatment to help effusion drain.  Also continues to have harsh cough with some SOB.  Given albuterol hfa and prednisone for home.  Doubt pneumonia, though patient will continue taking Augmentin (extended to 10 days) to cover OM.  No mastoid tenderness.  D/C home.   Discussed result, findings, treatment, and follow up  with patient.  Pt given return precautions.  Pt verbalizes understanding and agrees with plan.       Final Clinical Impressions(s) / ED Diagnoses   Final diagnoses:  Bilateral otitis media with effusion  Bronchitis    New Prescriptions Discharge Medication List as of 03/05/2016  8:54 PM    START taking these medications   Details  diphenhydrAMINE (BENADRYL) 25 MG tablet Take 1 tablet (25 mg total) by mouth every 6 (six) hours., Starting Wed 03/05/2016, Print    fluticasone (FLONASE) 50 MCG/ACT nasal spray Place 2 sprays into both nostrils daily., Starting Wed 03/05/2016, Print    predniSONE (STERAPRED UNI-PAK 21 TAB) 10 MG (21) TBPK tablet Take 1 tablet (10 mg total) by mouth daily. Day 1: take 6 tabs.  Day 2: 5 tabs  Day 3: 4 tabs  Day 4: 3 tabs  Day 5: 2 tabs  Day 6: 1 tab, Starting Wed 03/05/2016, Print    pseudoephedrine (SUDAFED) 60 MG tablet Take 1 tablet (60 mg total) by mouth every 6 (six) hours as needed for congestion., Starting Wed 03/05/2016, Print         [place], PA-C 03/05/16 2144    Sherwood Gambler, MD 03/08/16 [phone]

## 2016-03-05 NOTE — Discharge Instructions (Signed)
Read the information below.  Use the prescribed medication as directed.  Please discuss all new medications with your pharmacist.  You may return to the Emergency Department at any time for worsening condition or any new symptoms that concern you.  If you develop high fevers that do not resolve with tylenol or ibuprofen, you have difficulty swallowing or breathing, or you are unable to tolerate fluids by mouth, return to the ER for a recheck.    °

## 2016-07-10 ENCOUNTER — Encounter (INDEPENDENT_AMBULATORY_CARE_PROVIDER_SITE_OTHER): Payer: Self-pay | Admitting: Physician Assistant

## 2016-07-10 ENCOUNTER — Ambulatory Visit (INDEPENDENT_AMBULATORY_CARE_PROVIDER_SITE_OTHER): Payer: Managed Care, Other (non HMO) | Admitting: Physician Assistant

## 2016-07-10 VITALS — BP 151/97 | HR 84 | Temp 97.8°F | Ht 62.0 in | Wt 204.4 lb

## 2016-07-10 DIAGNOSIS — I10 Essential (primary) hypertension: Secondary | ICD-10-CM

## 2016-07-10 DIAGNOSIS — G43909 Migraine, unspecified, not intractable, without status migrainosus: Secondary | ICD-10-CM

## 2016-07-10 DIAGNOSIS — E221 Hyperprolactinemia: Secondary | ICD-10-CM | POA: Diagnosis not present

## 2016-07-10 DIAGNOSIS — M545 Low back pain, unspecified: Secondary | ICD-10-CM

## 2016-07-10 DIAGNOSIS — G8929 Other chronic pain: Secondary | ICD-10-CM | POA: Diagnosis not present

## 2016-07-10 MED ORDER — SUMATRIPTAN SUCCINATE 25 MG PO TABS: 25.0000 mg | ORAL_TABLET | Freq: Two times a day (BID) | ORAL | 1 refills | Status: DC

## 2016-07-10 MED ORDER — HYDROCHLOROTHIAZIDE 12.5 MG PO TABS: 12.5000 mg | ORAL_TABLET | Freq: Every day | ORAL | 2 refills | Status: DC

## 2016-07-10 MED ORDER — LISINOPRIL 10 MG PO TABS: 10.0000 mg | ORAL_TABLET | Freq: Every day | ORAL | 2 refills | Status: DC

## 2016-07-10 MED ORDER — CYCLOBENZAPRINE HCL 10 MG PO TABS: 10.0000 mg | ORAL_TABLET | Freq: Every day | ORAL | 0 refills | Status: DC

## 2016-07-10 NOTE — Patient Instructions (Signed)
Please return in one to two weeks for hypertension follow up. Take medications as directed. Read this educational material. Call us or the ambulance should your encounter persistent chest pain, SOB, sweating, left arm pain, jaw pain, mid back pain, weakness, visual blurring, or any other particularly worrisome symptoms.   Hypertension Hypertension, commonly called high blood pressure, is when the force of blood pumping through the arteries is too strong. The arteries are the blood vessels that carry blood from the heart throughout the body. Hypertension forces the heart to work harder to pump blood and may cause arteries to become narrow or stiff. Having untreated or uncontrolled hypertension can cause heart attacks, strokes, kidney disease, and other problems. A blood pressure reading consists of a higher number over a lower number. Ideally, your blood pressure should be below 120/80. The first ("top") number is called the systolic pressure. It is a measure of the pressure in your arteries as your heart beats. The second ("bottom") number is called the diastolic pressure. It is a measure of the pressure in your arteries as the heart relaxes. What are the causes? The cause of this condition is not known. What increases the risk? Some risk factors for high blood pressure are under your control. Others are not. Factors you can change   Smoking.  Having type 2 diabetes mellitus, high cholesterol, or both.  Not getting enough exercise or physical activity.  Being overweight.  Having too much fat, sugar, calories, or salt (sodium) in your diet.  Drinking too much alcohol. Factors that are difficult or impossible to change   Having chronic kidney disease.  Having a family history of high blood pressure.  Age. Risk increases with age.  Race. You may be at higher risk if you are African-American.  Gender. Men are at higher risk than women before age 91. After age 43, women are at higher risk  than men.  Having obstructive sleep apnea.  Stress. What are the signs or symptoms? Extremely high blood pressure (hypertensive crisis) may cause:  Headache.  Anxiety.  Shortness of breath.  Nosebleed.  Nausea and vomiting.  Severe chest pain.  Jerky movements you cannot control (seizures). How is this diagnosed? This condition is diagnosed by measuring your blood pressure while you are seated, with your arm resting on a surface. The cuff of the blood pressure monitor will be placed directly against the skin of your upper arm at the level of your heart. It should be measured at least twice using the same arm. Certain conditions can cause a difference in blood pressure between your right and left arms. Certain factors can cause blood pressure readings to be lower or higher than normal (elevated) for a short period of time:  When your blood pressure is higher when you are in a health care provider's office than when you are at home, this is called white coat hypertension. Most people with this condition do not need medicines.  When your blood pressure is higher at home than when you are in a health care provider's office, this is called masked hypertension. Most people with this condition may need medicines to control blood pressure. If you have a high blood pressure reading during one visit or you have normal blood pressure with other risk factors:  You may be asked to return on a different day to have your blood pressure checked again.  You may be asked to monitor your blood pressure at home for 1 week or longer. If you are  diagnosed with hypertension, you may have other blood or imaging tests to help your health care provider understand your overall risk for other conditions. How is this treated? This condition is treated by making healthy lifestyle changes, such as eating healthy foods, exercising more, and reducing your alcohol intake. Your health care provider may prescribe  medicine if lifestyle changes are not enough to get your blood pressure under control, and if:  Your systolic blood pressure is above 130.  Your diastolic blood pressure is above 80. Your personal target blood pressure may vary depending on your medical conditions, your age, and other factors. Follow these instructions at home: Eating and drinking   Eat a diet that is high in fiber and potassium, and low in sodium, added sugar, and fat. An example eating plan is called the DASH (Dietary Approaches to Stop Hypertension) diet. To eat this way:  Eat plenty of fresh fruits and vegetables. Try to fill half of your plate at each meal with fruits and vegetables.  Eat whole grains, such as whole wheat pasta, brown rice, or whole grain bread. Fill about one quarter of your plate with whole grains.  Eat or drink low-fat dairy products, such as skim milk or low-fat yogurt.  Avoid fatty cuts of meat, processed or cured meats, and poultry with skin. Fill about one quarter of your plate with lean proteins, such as fish, chicken without skin, beans, eggs, and tofu.  Avoid premade and processed foods. These tend to be higher in sodium, added sugar, and fat.  Reduce your daily sodium intake. Most people with hypertension should eat less than 1,500 mg of sodium a day.  Limit alcohol intake to no more than 1 drink a day for nonpregnant women and 2 drinks a day for men. One drink equals 12 oz of beer, 5 oz of wine, or 1 oz of hard liquor. Lifestyle   Work with your health care provider to maintain a healthy body weight or to lose weight. Ask what an ideal weight is for you.  Get at least 30 minutes of exercise that causes your heart to beat faster (aerobic exercise) most days of the week. Activities may include walking, swimming, or biking.  Include exercise to strengthen your muscles (resistance exercise), such as pilates or lifting weights, as part of your weekly exercise routine. Try to do these types  of exercises for 30 minutes at least 3 days a week.  Do not use any products that contain nicotine or tobacco, such as cigarettes and e-cigarettes. If you need help quitting, ask your health care provider.  Monitor your blood pressure at home as told by your health care provider.  Keep all follow-up visits as told by your health care provider. This is important. Medicines   Take over-the-counter and prescription medicines only as told by your health care provider. Follow directions carefully. Blood pressure medicines must be taken as prescribed.  Do not skip doses of blood pressure medicine. Doing this puts you at risk for problems and can make the medicine less effective.  Ask your health care provider about side effects or reactions to medicines that you should watch for. Contact a health care provider if:  You think you are having a reaction to a medicine you are taking.  You have headaches that keep coming back (recurring).  You feel dizzy.  You have swelling in your ankles.  You have trouble with your vision. Get help right away if:  You develop a severe headache or  confusion.  You have unusual weakness or numbness.  You feel faint.  You have severe pain in your chest or abdomen.  You vomit repeatedly.  You have trouble breathing. Summary  Hypertension is when the force of blood pumping through your arteries is too strong. If this condition is not controlled, it may put you at risk for serious complications.  Your personal target blood pressure may vary depending on your medical conditions, your age, and other factors. For most people, a normal blood pressure is less than 120/80.  Hypertension is treated with lifestyle changes, medicines, or a combination of both. Lifestyle changes include weight loss, eating a healthy, low-sodium diet, exercising more, and limiting alcohol. This information is not intended to replace advice given to you by your health care provider.  Make sure you discuss any questions you have with your health care provider. Document Released: 04/28/2005 Document Revised: 03/26/2016 Document Reviewed: 03/26/2016 Elsevier Interactive Patient Education  2017 [employer].

## 2016-07-10 NOTE — Progress Notes (Signed)
Subjective:  Patient ID: Tracy Johns, female    DOB: [birth year]  Age: 41 y.o. MRN: [id number]  CC:  Establish care  HPI Adaja Marusak is a 41 y.o. female with a PMH of migraines, LBP, and hyperprolactinemia since today with concern for hypertension. States that over the last 3 months she has acquired hypertension. Has a strong family history of hypertension and death of father and father at relatively young ages due to complications from hypertension. Was first noted to have elevated blood pressure when she went to the emergency department for viral respiratory tract infections and sinusitis. She is noted to have high blood pressure today here in clinic. Does not endorse chest pain, shortness of breath, headache, diaphoresis, left arm pain, mid back pain, jaw pain, lightheadedness, dyspnea, lower extremity edema, orthopnea, presyncope, or syncope.      Patient would also like to address chronic lower back pain. Back pain attributed to an epidural she received many years ago. Not been to physical therapy for her back pain since 1991, nor has she been to the chiropractor since 1991. He endorses right upper extremity and left upper extremity tingling described as sporadic. She was a hairdresser for 14 years and does not know if her symptoms are perhaps attributed to carpal tunnel syndrome.       In regards to hyperprolactinemia, patient received treatment many years ago that helped regulate her menstrual cycle. Believes her prolactin level was between 200 and 300. She has not taken treatment recently and would like to know if she can have her prolactin level drawn.   Outpatient Medications Prior to Visit  Medication Sig Dispense Refill  . ibuprofen (ADVIL,MOTRIN) 200 MG tablet Take 1,600 mg by mouth every 6 (six) hours as needed for headache or moderate pain.    . predniSONE (STERAPRED UNI-PAK 21 TAB) 10 MG (21) TBPK tablet Take 1 tablet (10 mg total) by mouth daily. Day 1: take 6 tabs.  Day  2: 5 tabs  Day 3: 4 tabs  Day 4: 3 tabs  Day 5: 2 tabs  Day 6: 1 tab 21 tablet 0  . amoxicillin-clavulanate (AUGMENTIN) 875-125 MG tablet Take 1 tablet by mouth every 12 (twelve) hours. To be added on to current treatment 6 tablet 0  . benzonatate (TESSALON) 100 MG capsule Take 2 capsules (200 mg total) by mouth 2 (two) times daily as needed for cough. 20 capsule 0  . dicyclomine (BENTYL) 20 MG tablet Take 1 tablet (20 mg total) by mouth 2 (two) times daily. (Patient not taking: Reported on 03/05/2016) 12 tablet 0  . diphenhydrAMINE (BENADRYL) 25 MG tablet Take 1 tablet (25 mg total) by mouth every 6 (six) hours. 20 tablet 0  . famotidine (PEPCID) 20 MG tablet Take 1 tablet (20 mg total) by mouth 2 (two) times daily. 30 tablet 0  . fluticasone (FLONASE) 50 MCG/ACT nasal spray Place 2 sprays into both nostrils daily. 16 g 0  . oxymetazoline (AFRIN NASAL SPRAY) 0.05 % nasal spray Place 1 spray into both nostrils 2 (two) times daily. Place 1 spray into both nostrils 2 times daily for the next 3 days as needed. Do not use for more than 3 days to prevent rebound rhinorrhea. 30 mL 0  . pseudoephedrine (SUDAFED) 60 MG tablet Take 1 tablet (60 mg total) by mouth every 6 (six) hours as needed for congestion. 30 tablet 0   No facility-administered medications prior to visit.      ROS Review of Systems  Constitutional: Negative for chills, fever and malaise/fatigue.  Eyes: Negative for blurred vision.  Respiratory: Negative for shortness of breath.   Cardiovascular: Negative for chest pain and palpitations.  Gastrointestinal: Negative for abdominal pain and nausea.  Genitourinary: Negative for dysuria and hematuria.  Musculoskeletal: Positive for back pain. Negative for joint pain and myalgias.  Skin: Negative for rash.  Neurological: Negative for tingling, focal weakness and headaches.  Psychiatric/Behavioral: Negative for depression. The patient is not nervous/anxious.     Objective:  BP (!)  151/97   Pulse 84   Temp 97.8 F (36.6 C) (Oral)   Ht 5\' 2"  (1.575 m)   Wt 204 lb 6.4 oz (92.7 kg)   LMP 06/12/2016 (Exact Date)   SpO2 99%   BMI 37.39 kg/m   BP/Weight 07/10/2016 03/05/2016 [id number]  Systolic BP 123XX123 [id number]  Diastolic BP 97 88 [id number]  Wt. (Lbs) 204.4 - -  BMI 37.39 - -      Physical Exam  Constitutional: She is oriented to person, place, and time.  Obese, NAD, polite  HENT:  Head: Normocephalic and atraumatic.  Eyes: No scleral icterus.  Neck: Normal range of motion. Neck supple. No thyromegaly present.  Cardiovascular: Normal rate, regular rhythm and normal heart sounds.   Pulmonary/Chest: Effort normal and breath sounds normal.  Abdominal: Soft. Bowel sounds are normal. There is no tenderness.  Musculoskeletal: She exhibits no edema.  Lumbar spine with full active range of motion, however, pain elicited with flexion and extension. Lateral flexion and rotation normal bilaterally.  Neurological: She is alert and oriented to person, place, and time.  Skin: Skin is warm and dry. No rash noted. No erythema. No pallor.  Psychiatric: She has a normal mood and affect. Her behavior is normal. Thought content normal.  Vitals reviewed.    Assessment & Plan:   1. Hypertension, unspecified type - CBC with Differential/Platelet - Comprehensive metabolic panel - TSH - Lipid panel  2. Chronic bilateral low back pain without sciatica - Ambulatory referral to Physical Therapy  3. Migraine without status migrainosus, not intractable, unspecified migraine type - Sumatriptan 25 mg, take as directed  4. Hyperprolactinemia (HCC) - Prolactin   Meds ordered this encounter  Medications  . hydrochlorothiazide (HYDRODIURIL) 12.5 MG tablet    Sig: Take 1 tablet (12.5 mg total) by mouth daily.    Dispense:  30 tablet    Refill:  2    Order Specific Question:   Supervising Provider    Answer:   Tresa Garter [id number]  . lisinopril (PRINIVIL,ZESTRIL) 10 MG  tablet    Sig: Take 1 tablet (10 mg total) by mouth daily.    Dispense:  30 tablet    Refill:  2    Order Specific Question:   Supervising Provider    Answer:   Tresa Garter [id number]  . SUMAtriptan (IMITREX) 25 MG tablet    Sig: Take 1 tablet (25 mg total) by mouth 2 (two) times daily. May repeat in 2 hours if headache persists or recurs. No more than two tablets per day.    Dispense:  14 tablet    Refill:  1    Order Specific Question:   Supervising Provider    Answer:   Tresa Garter [id number]  . cyclobenzaprine (FLEXERIL) 10 MG tablet    Sig: Take 1 tablet (10 mg total) by mouth at bedtime.    Dispense:  10 tablet    Refill:  0    Order Specific  Question:   Supervising Provider    Answer:   Tresa Garter [id number]    Follow-up: Return in about 1 week (around 07/17/2016) for HTN follow up.   Clent Demark PA

## 2016-07-11 ENCOUNTER — Telehealth (INDEPENDENT_AMBULATORY_CARE_PROVIDER_SITE_OTHER): Payer: Self-pay | Admitting: Physician Assistant

## 2016-07-11 ENCOUNTER — Other Ambulatory Visit (INDEPENDENT_AMBULATORY_CARE_PROVIDER_SITE_OTHER): Payer: Self-pay | Admitting: Physician Assistant

## 2016-07-11 DIAGNOSIS — M545 Low back pain: Principal | ICD-10-CM

## 2016-07-11 DIAGNOSIS — G8929 Other chronic pain: Secondary | ICD-10-CM

## 2016-07-14 ENCOUNTER — Ambulatory Visit (HOSPITAL_COMMUNITY)
Admission: RE | Admit: 2016-07-14 | Discharge: 2016-07-14 | Disposition: A | Discharge status: Home / Self Care | Payer: Managed Care, Other (non HMO) | Source: Ambulatory Visit | Attending: Physician Assistant | Admitting: Physician Assistant

## 2016-07-14 DIAGNOSIS — G8929 Other chronic pain: Secondary | ICD-10-CM | POA: Insufficient documentation

## 2016-07-14 DIAGNOSIS — R935 Abnormal findings on diagnostic imaging of other abdominal regions, including retroperitoneum: Secondary | ICD-10-CM | POA: Diagnosis not present

## 2016-07-14 DIAGNOSIS — M545 Low back pain, unspecified: Secondary | ICD-10-CM

## 2016-07-14 NOTE — Telephone Encounter (Signed)
I asked patient to have L spine XR done. She will go today or tomorrow. I will then be able to complete her paperwork for accomodation of special equipment at work.

## 2016-07-15 ENCOUNTER — Telehealth (INDEPENDENT_AMBULATORY_CARE_PROVIDER_SITE_OTHER): Payer: Self-pay

## 2016-07-15 NOTE — Telephone Encounter (Signed)
Patient called back to discuss Xray results. She stated that she does have kidney stones, diagnosed in 2008. Patient already scheduled for physical therapy. Patient is not happy about not being able to get the chair and lift due to still having chronic/severe pain. She does not have funding to continue to have appointments that are not aiding her pain Nat Christen, CMA

## 2016-07-15 NOTE — Telephone Encounter (Signed)
Noted  

## 2016-07-16 ENCOUNTER — Ambulatory Visit: Payer: Managed Care, Other (non HMO) | Admitting: Physical Therapy

## 2016-07-24 ENCOUNTER — Ambulatory Visit (INDEPENDENT_AMBULATORY_CARE_PROVIDER_SITE_OTHER): Payer: 59 | Admitting: Physician Assistant

## 2016-07-28 ENCOUNTER — Emergency Department (HOSPITAL_COMMUNITY)
Admission: EM | Admit: 2016-07-28 | Discharge: 2016-07-29 | Disposition: A | Discharge status: Home / Self Care | Payer: Managed Care, Other (non HMO) | Attending: Emergency Medicine | Admitting: Emergency Medicine

## 2016-07-28 ENCOUNTER — Encounter (HOSPITAL_COMMUNITY): Payer: Self-pay | Admitting: Emergency Medicine

## 2016-07-28 DIAGNOSIS — Z5321 Procedure and treatment not carried out due to patient leaving prior to being seen by health care provider: Secondary | ICD-10-CM | POA: Insufficient documentation

## 2016-07-28 DIAGNOSIS — F17210 Nicotine dependence, cigarettes, uncomplicated: Secondary | ICD-10-CM | POA: Diagnosis not present

## 2016-07-28 DIAGNOSIS — I10 Essential (primary) hypertension: Secondary | ICD-10-CM | POA: Insufficient documentation

## 2016-07-28 DIAGNOSIS — Z79899 Other long term (current) drug therapy: Secondary | ICD-10-CM | POA: Diagnosis not present

## 2016-07-28 DIAGNOSIS — R109 Unspecified abdominal pain: Secondary | ICD-10-CM | POA: Insufficient documentation

## 2016-07-28 HISTORY — DX: Essential (primary) hypertension: I10

## 2016-07-28 NOTE — ED Triage Notes (Signed)
Patient c/o left flank pain radiating to LLQ. Hx kidney stones. Denies N/V/D, chest pain and SOB. Ambulatory to triage. Denies urinary symptoms.

## 2016-07-29 NOTE — ED Notes (Signed)
Pt informed NT first that she was leaving facility

## 2016-07-30 ENCOUNTER — Ambulatory Visit (INDEPENDENT_AMBULATORY_CARE_PROVIDER_SITE_OTHER): Payer: Managed Care, Other (non HMO) | Admitting: Physician Assistant

## 2016-08-07 ENCOUNTER — Other Ambulatory Visit (INDEPENDENT_AMBULATORY_CARE_PROVIDER_SITE_OTHER): Payer: Self-pay | Admitting: Physician Assistant

## 2016-08-07 ENCOUNTER — Ambulatory Visit (INDEPENDENT_AMBULATORY_CARE_PROVIDER_SITE_OTHER): Payer: Managed Care, Other (non HMO) | Admitting: Physician Assistant

## 2016-08-07 ENCOUNTER — Encounter (INDEPENDENT_AMBULATORY_CARE_PROVIDER_SITE_OTHER): Payer: Self-pay | Admitting: Physician Assistant

## 2016-08-07 VITALS — BP 128/85 | HR 70 | Temp 98.5°F | Ht 62.0 in | Wt 202.4 lb

## 2016-08-07 DIAGNOSIS — M5442 Lumbago with sciatica, left side: Secondary | ICD-10-CM

## 2016-08-07 DIAGNOSIS — N926 Irregular menstruation, unspecified: Secondary | ICD-10-CM

## 2016-08-07 DIAGNOSIS — G43909 Migraine, unspecified, not intractable, without status migrainosus: Secondary | ICD-10-CM

## 2016-08-07 DIAGNOSIS — K219 Gastro-esophageal reflux disease without esophagitis: Secondary | ICD-10-CM

## 2016-08-07 DIAGNOSIS — R1013 Epigastric pain: Secondary | ICD-10-CM | POA: Diagnosis not present

## 2016-08-07 DIAGNOSIS — G8929 Other chronic pain: Secondary | ICD-10-CM | POA: Diagnosis not present

## 2016-08-07 MED ORDER — OMEPRAZOLE 40 MG PO CPDR: 40.0000 mg | DELAYED_RELEASE_CAPSULE | Freq: Every day | ORAL | 3 refills | Status: DC

## 2016-08-07 NOTE — Patient Instructions (Signed)
Heartburn Heartburn is a type of pain or discomfort that can happen in the throat or chest. It is often described as a burning pain. It may also cause a bad taste in the mouth. Heartburn may feel worse when you lie down or bend over, and it is often worse at night. Heartburn may be caused by stomach contents that move back up into the esophagus (reflux). Follow these instructions at home: Take these actions to decrease your discomfort and to help avoid complications. Diet  Follow a diet as recommended by your health care provider. This may involve avoiding foods and drinks such as: ? Coffee and tea (with or without caffeine). ? Drinks that contain alcohol. ? Energy drinks and sports drinks. ? Carbonated drinks or sodas. ? Chocolate and cocoa. ? Peppermint and mint flavorings. ? Garlic and onions. ? Horseradish. ? Spicy and acidic foods, including peppers, chili powder, curry powder, vinegar, hot sauces, and barbecue sauce. ? Citrus fruit juices and citrus fruits, such as oranges, lemons, and limes. ? Tomato-based foods, such as red sauce, chili, salsa, and pizza with red sauce. ? Fried and fatty foods, such as donuts, french fries, potato chips, and high-fat dressings. ? High-fat meats, such as hot dogs and fatty cuts of red and white meats, such as rib eye steak, sausage, ham, and bacon. ? High-fat dairy items, such as whole milk, butter, and cream cheese.  Eat small, frequent meals instead of large meals.  Avoid drinking large amounts of liquid with your meals.  Avoid eating meals during the 2-3 hours before bedtime.  Avoid lying down right after you eat.  Do not exercise right after you eat. General instructions  Pay attention to any changes in your symptoms.  Take over-the-counter and prescription medicines only as told by your health care provider. Do not take aspirin, ibuprofen, or other NSAIDs unless your health care provider told you to do so.  Do not use any tobacco  products, including cigarettes, chewing tobacco, and e-cigarettes. If you need help quitting, ask your health care provider.  Wear loose-fitting clothing. Do not wear anything tight around your waist that causes pressure on your abdomen.  Raise (elevate) the head of your bed about 6 inches (15 cm).  Try to reduce your stress, such as with yoga or meditation. If you need help reducing stress, ask your health care provider.  If you are overweight, reduce your weight to an amount that is healthy for you. Ask your health care provider for guidance about a safe weight loss goal.  Keep all follow-up visits as told by your health care provider. This is important. Contact a health care provider if:  You have new symptoms.  You have unexplained weight loss.  You have difficulty swallowing, or it hurts to swallow.  You have wheezing or a persistent cough.  Your symptoms do not improve with treatment.  You have frequent heartburn for more than two weeks. Get help right away if:  You have pain in your arms, neck, jaw, teeth, or back.  You feel sweaty, dizzy, or light-headed.  You have chest pain or shortness of breath.  You vomit and your vomit looks like blood or coffee grounds.  Your stool is bloody or black. This information is not intended to replace advice given to you by your health care provider. Make sure you discuss any questions you have with your health care provider. Document Released: 09/14/2008 Document Revised: 10/04/2015 Document Reviewed: 08/23/2014 Elsevier Interactive Patient Education  2017 Elsevier   Inc.  

## 2016-08-07 NOTE — Progress Notes (Signed)
Subjective:  Patient ID: Tracy Johns, female    DOB: Aug 11, 1975  Age: 41 y.o. MRN: [id number]  CC:  Epigastric pain  HPI Tracy Johns is a 41 y.o. female with a PMH of HTN, Migraines, and LBP presents with one day hx of epigastric pain. Had abdominal cramping last night. Upper EGD many years ago showed "irritation" but there was no established cause of irritation. There is occasional problems with acid reflux and heartburn. Tomato sauce and food with spice and acidity aggravates the epigastrium. She went to the ED on 07/28/16 for flank pain but left because of the long wait. Received no treatment or evaluation. Flank pain is now reportedly gone.     In regards to back pain, the pain continues in the lumbar region with occasional radiation of "shooting and numbing" pain through the posterior aspect of left leg to the left heel. Cancelled appointment to physical therapy. Has not had any sessions yet. However, says she will call to make another appointment.     In regards to HTN, she is taking her medications as directed. Failed to show for previous HTN f/u appointment due to fatigue from cyclobenzaprine. However, her blood pressure has subsided to normotensive levels. Also reports less headaches as the blood pressure decreased. Has taken Sumatriptan sparingly due to less frequent headaches. Sumatriptan is reported as very effective for her headaches.      Review of Systems  Constitutional: Negative for chills, fever and malaise/fatigue.  Eyes: Negative for blurred vision.  Respiratory: Negative for shortness of breath.   Cardiovascular: Negative for chest pain and palpitations.  Gastrointestinal: Positive for abdominal pain and heartburn. Negative for blood in stool, constipation, diarrhea, melena, nausea and vomiting.  Genitourinary: Negative for dysuria and hematuria.       Irregular menses  Musculoskeletal: Positive for back pain. Negative for joint pain and myalgias.  Skin: Negative  for rash.  Neurological: Positive for headaches. Negative for tingling.  Psychiatric/Behavioral: Negative for depression. The patient is not nervous/anxious.     Objective:  BP 128/85 (BP Location: Right Arm, Patient Position: Sitting, Cuff Size: Large)   Pulse 70   Temp 98.5 F (36.9 C) (Oral)   Ht 5\' 2"  (1.575 m)   Wt 202 lb 6.4 oz (91.8 kg)   SpO2 98%   BMI 37.02 kg/m   BP/Weight 08/07/2016 6/96/7893 12/10/173  Systolic BP 102 585 277  Diastolic BP 85 824 97  Wt. (Lbs) 202.4 204 204.4  BMI 37.02 37.31 37.39      Physical Exam  Constitutional: She is oriented to person, place, and time.  Well developed, obese, NAD, polite  HENT:  Head: Normocephalic and atraumatic.  Eyes: No scleral icterus.  Neck: Normal range of motion. Neck supple. No thyromegaly present.  Cardiovascular: Normal rate, regular rhythm and normal heart sounds.   Pulmonary/Chest: Effort normal and breath sounds normal.  Abdominal: Soft. Bowel sounds are normal. There is tenderness (mild epigastric and RUQ TTP).  Musculoskeletal: She exhibits no edema.  Normal ambulation and movement of  torso.  Neurological: She is alert and oriented to person, place, and time.  Skin: Skin is warm and dry. No rash noted. No erythema. No pallor.  Psychiatric: She has a normal mood and affect. Her behavior is normal. Thought content normal.  Vitals reviewed.    Assessment & Plan:   1. Gastroesophageal reflux disease, esophagitis presence not specified - H. pylori antibody, IgG - omeprazole (PRILOSEC) 40 MG capsule; Take 1 capsule (40 mg total) by  mouth daily.  Dispense: 30 capsule; Refill: 3  2. Abdominal pain, chronic, epigastric - omeprazole (PRILOSEC) 40 MG capsule; Take 1 capsule (40 mg total) by mouth daily.  Dispense: 30 capsule; Refill: 3  3. Migraine without status migrainosus, not intractable, unspecified migraine type - Reported as much better since reduction of BP. Sumatriptan effective.  4. Chronic  left-sided low back pain with left-sided sciatica - Will need to go to PT. I have asked her to call PT and schedule her appointment.  5. Irregular menses - AMENORRHEA PROFILE   Meds ordered this encounter  Medications  . omeprazole (PRILOSEC) 40 MG capsule    Sig: Take 1 capsule (40 mg total) by mouth daily.    Dispense:  30 capsule    Refill:  3    Order Specific Question:   Supervising Provider    Answer:   Tresa Garter [[id number]]    Follow-up: Return in about 4 weeks (around 09/04/2016) for f/u multiple complaints.   Clent Demark PA

## 2016-08-08 LAB — H. PYLORI ANTIBODY, IGG: H. pylori, IgG AbS: <0.8 Index Value (ref 0.00–0.79)

## 2016-08-10 LAB — AMENORRHEA PROFILE
FSH: 9.2 m[IU]/mL
LH: 6.9 m[IU]/mL
PROLACTIN: 101.7 ng/mL — AB (ref 4.8–23.3)

## 2016-08-10 LAB — SPECIMEN STATUS REPORT

## 2016-08-11 ENCOUNTER — Telehealth (INDEPENDENT_AMBULATORY_CARE_PROVIDER_SITE_OTHER): Payer: Self-pay | Admitting: *Deleted

## 2016-08-11 NOTE — Telephone Encounter (Signed)
Patient verified DOB Patient is aware of prolactin level being elevated and hypothyroidism needing to be ruled out. Patient is scheduled for a lab visit on 08/12/16 at 11:00 am to complete labs. Patient expressed her understanding and will return for other lab draws. No further questions at this time.

## 2016-08-11 NOTE — Telephone Encounter (Signed)
-----   Message from Clent Demark, PA-C sent at 08/11/2016  9:18 AM EDT ----- Prolactin elevated. We need to r/o hypothyroidism as a cause of this. I have already ordered a TSH on her on 07/10/16 but she has not done it yet. Please have her get the TSH done along with other testing she has not completed yet from the same date. Thank you.

## 2016-08-12 ENCOUNTER — Other Ambulatory Visit (INDEPENDENT_AMBULATORY_CARE_PROVIDER_SITE_OTHER): Payer: Managed Care, Other (non HMO)

## 2016-08-12 DIAGNOSIS — I10 Essential (primary) hypertension: Secondary | ICD-10-CM

## 2016-08-12 DIAGNOSIS — E221 Hyperprolactinemia: Secondary | ICD-10-CM

## 2016-08-12 NOTE — Progress Notes (Signed)
Pt here to have labs drawn.

## 2016-08-13 LAB — CBC WITH DIFFERENTIAL/PLATELET
BASOS: 0 %
Basophils Absolute: 0 10*3/uL (ref 0.0–0.2)
EOS (ABSOLUTE): 0.1 10*3/uL (ref 0.0–0.4)
Eos: 1 %
HEMATOCRIT: 36.4 % (ref 34.0–46.6)
HEMOGLOBIN: 12 g/dL (ref 11.1–15.9)
Immature Grans (Abs): 0 10*3/uL (ref 0.0–0.1)
Immature Granulocytes: 0 %
LYMPHS ABS: 2.1 10*3/uL (ref 0.7–3.1)
Lymphs: 29 %
MCH: 30.6 pg (ref 26.6–33.0)
MCHC: 33 g/dL (ref 31.5–35.7)
MCV: 93 fL (ref 79–97)
MONOCYTES: 6 %
Monocytes Absolute: 0.4 10*3/uL (ref 0.1–0.9)
NEUTROS ABS: 4.8 10*3/uL (ref 1.4–7.0)
Neutrophils: 64 %
Platelets: 369 10*3/uL (ref 150–379)
RBC: 3.92 x10E6/uL (ref 3.77–5.28)
RDW: 13.4 % (ref 12.3–15.4)
WBC: 7.4 10*3/uL (ref 3.4–10.8)

## 2016-08-13 LAB — COMPREHENSIVE METABOLIC PANEL
ALK PHOS: 98 IU/L (ref 39–117)
ALT: 18 IU/L (ref 0–32)
AST: 15 IU/L (ref 0–40)
Albumin/Globulin Ratio: 1.5 (ref 1.2–2.2)
Albumin: 4.3 g/dL (ref 3.5–5.5)
BILIRUBIN TOTAL: 0.2 mg/dL (ref 0.0–1.2)
BUN/Creatinine Ratio: 22 (ref 9–23)
BUN: 17 mg/dL (ref 6–24)
CHLORIDE: 101 mmol/L (ref 96–106)
CO2: 28 mmol/L (ref 18–29)
CREATININE: 0.77 mg/dL (ref 0.57–1.00)
Calcium: 9.6 mg/dL (ref 8.7–10.2)
GFR calc Af Amer: 111 mL/min/{1.73_m2} (ref >59)
GFR calc non Af Amer: 96 mL/min/{1.73_m2} (ref >59)
Globulin, Total: 2.9 g/dL (ref 1.5–4.5)
Glucose: 102 mg/dL — ABNORMAL HIGH (ref 65–99)
Potassium: 4.2 mmol/L (ref 3.5–5.2)
SODIUM: 143 mmol/L (ref 134–144)
Total Protein: 7.2 g/dL (ref 6.0–8.5)

## 2016-08-13 LAB — TSH: TSH: 1.62 u[IU]/mL (ref 0.450–4.500)

## 2016-08-13 LAB — PROLACTIN: PROLACTIN: 108 ng/mL — AB (ref 4.8–23.3)

## 2016-08-13 LAB — LIPID PANEL
CHOL/HDL RATIO: 4.9 ratio — AB (ref 0.0–4.4)
Cholesterol, Total: 227 mg/dL — ABNORMAL HIGH (ref 100–199)
HDL: 46 mg/dL (ref >39)
LDL CALC: 163 mg/dL — AB (ref 0–99)
Triglycerides: 92 mg/dL (ref 0–149)
VLDL CHOLESTEROL CAL: 18 mg/dL (ref 5–40)

## 2016-08-14 ENCOUNTER — Other Ambulatory Visit (INDEPENDENT_AMBULATORY_CARE_PROVIDER_SITE_OTHER): Payer: Self-pay | Admitting: Physician Assistant

## 2016-08-14 DIAGNOSIS — M545 Low back pain: Principal | ICD-10-CM

## 2016-08-14 DIAGNOSIS — G8929 Other chronic pain: Secondary | ICD-10-CM

## 2016-08-15 ENCOUNTER — Other Ambulatory Visit (INDEPENDENT_AMBULATORY_CARE_PROVIDER_SITE_OTHER): Payer: Self-pay | Admitting: Physician Assistant

## 2016-08-15 DIAGNOSIS — E221 Hyperprolactinemia: Secondary | ICD-10-CM

## 2016-08-15 NOTE — Progress Notes (Signed)
Hyperprolactinemia.

## 2016-09-30 ENCOUNTER — Ambulatory Visit: Payer: Managed Care, Other (non HMO) | Admitting: Internal Medicine

## 2016-10-02 ENCOUNTER — Ambulatory Visit (INDEPENDENT_AMBULATORY_CARE_PROVIDER_SITE_OTHER): Payer: Managed Care, Other (non HMO) | Admitting: Internal Medicine

## 2016-10-02 ENCOUNTER — Encounter: Payer: Self-pay | Admitting: Internal Medicine

## 2016-10-02 DIAGNOSIS — E221 Hyperprolactinemia: Secondary | ICD-10-CM

## 2016-10-02 NOTE — Patient Instructions (Signed)
Please come back at 8 am, fasting, for labs.  Please come back for a follow-up appointment in 6 months.

## 2016-10-02 NOTE — Progress Notes (Addendum)
Patient ID: Tracy Johns, female   DOB: 06-20-1975, 41 y.o.   MRN: [id number]    HPI  Tracy Johns is a 41 y.o.-year-old female, referred by her PCP, Clent Demark, PA-C, for evaluation for hyperprolactinemia.  Pt. has been found to have a high prolactin level in 2012 [facility]) >> saw endocrinology >> Prolactin 300s >> A pituitary MRI was checked >> negative for a pituitary tumor >> started on Cabergoline once a week >> Prolactin decreased >> menses returned.   She then moved to [place] >> was homeless for a period of time, a lot of stress. She started again to have irregular menses. Now she and boyfriend would like to get pregnant (She had tubal ligation in 2002 >> will try to have it reversed) >> saw PCP >> In 07/2016, prolactin was 101.7. And LH was 6.9, FSH 9.2, both normal. She had repeat investigation in 08/2016:  Prolactin was still elevated, at 108. A TSH level, CMP, CBC were all normal.  Pt c/o: - + headaches - no galactorrhea, but tension in breasts - + weight gain - + depression/+ anxiety - fatigue - constipation - hair loss - + irregular menses - started at 41 y/o, along with hot flushes  I reviewed pt's thyroid tests: Lab Results  Component Value Date   TSH 1.620 08/12/2016    Pt. also has a history of seizure and vaso-vagal syncope >> stopped breathing - during an endometrial Bx. The syncopal episodes happen with acute stress/pain.  ROS: Constitutional: + weight gain, +fatigue, no subjective hyperthermia/hypothermia, + poor sleep Eyes: + blurry vision, no xerophthalmia ENT: no sore throat, no nodules palpated in throat, no dysphagia/odynophagia, no hoarseness Cardiovascular: no CP/SOB/palpitations/leg swelling Respiratory: no cough/SOB Gastrointestinal: no N/V/D/C Musculoskeletal: no muscle/joint aches Skin: no rashes Neurological: no tremors/numbness/tingling/dizziness, + HAs Psychiatric: + both: depression/anxiety + irreg. menses  Past Medical History:   Diagnosis Date  . Hypercholesterolemia   . Hypertension   . Migraines   . Seizures ([place])    r/t biopsy   Past Surgical History:  Procedure Laterality Date  . CESAREAN SECTION    . TUBAL LIGATION     Social History   Social History  . Marital status: Single    Spouse name: N/A  . Number of children: 4   Occupational History  . CSR for [employer]   Social History Main Topics  . Smoking status: Current Every Day Smoker    Packs/day: 1.5     Types: Cigarettes  . Smokeless tobacco: Never Used  . Alcohol use Yes     Comment: wine, beer 3x a week  . Drug use: No   Current Outpatient Prescriptions on File Prior to Visit  Medication Sig Dispense Refill  . hydrochlorothiazide (HYDRODIURIL) 12.5 MG tablet Take 1 tablet (12.5 mg total) by mouth daily. 30 tablet 2  . lisinopril (PRINIVIL,ZESTRIL) 10 MG tablet Take 1 tablet (10 mg total) by mouth daily. 30 tablet 2  . cyclobenzaprine (FLEXERIL) 10 MG tablet TAKE 1 TABLET BY MOUTH AT BEDTIME (Patient not taking: Reported on 10/02/2016) 10 tablet 0  . omeprazole (PRILOSEC) 40 MG capsule Take 1 capsule (40 mg total) by mouth daily. (Patient not taking: Reported on 10/02/2016) 30 capsule 3   No current facility-administered medications on file prior to visit.    Allergies  Allergen Reactions  . Percocet [Oxycodone-Acetaminophen] Itching and Other (See Comments)    Body feeling like it's burning    She has FH of: DM, HTN, HL, heart ds.,  cancer  PE: BP 130/82 (BP Location: Left Arm, Patient Position: Sitting)   Pulse 66   Ht 5' 2.5" (1.588 m)   Wt 202 lb (91.6 kg)   BMI 36.36 kg/m  Wt Readings from Last 3 Encounters:  10/02/16 202 lb (91.6 kg)  08/07/16 202 lb 6.4 oz (91.8 kg)  07/28/16 204 lb (92.5 kg)   Constitutional: overweight, in NAD Eyes: PERRLA, EOMI, no exophthalmos ENT: moist mucous membranes, no thyromegaly, no cervical lymphadenopathy Cardiovascular: RRR, No MRG Respiratory: CTA B Gastrointestinal: abdomen soft,  NT, ND, BS+ Musculoskeletal: no deformities, strength intact in all 4 Skin: moist, warm, no rashes Neurological: no tremor with outstretched hands, DTR normal in all 4  ASSESSMENT: 1. Hyperprolactinemia  PLAN:  Patient with persistent high prolactin levels, without the evidence of a pituitary tumor per MRI in 2012 (not available for review) - I discussed with the patient about possible etiologies of high prolactin levels:  Pregnancy (had tubal ligation)  Pituitary tumor (prolactinoma) - not evident on MRI in 2012 although PRL then 300s (!)  Hypothyroidism (TSH recently normal)  Stress   Exercise   Lack of sleep   Medications (she is not taking psychotropic medications or Reglan)  Drugs (denies)  Chest wall lesions (denies)  Seizures (not recently)  Liver ds (no)  Kidney disease (no)  A teratoma containing pituitary cells that can develop into prolactinoma (very rarely)  Macroprolactin (multimeric prolactin, especially since patient does not have galactorrhea)  idiopathic - high prolactin most frequently impacts menstrual cycle, but would like to check a macroprolactin as she does not have galactorrhea and this is a little unusual. - will also check pituitary labs >> she will return in am - she will then most likely need a new pituitary MRI - we discussed the possibility of tx of her high PRL levels (Cabergoline, low dose - she tolerated this well in the past) if the high PRL level is not caused by macroprolactin. She agrees with the plan. - RTC in 6 mo, but we will be in touch Re: results and further plan  Component     Latest Ref Rng & Units 10/03/2016  IGF-I, LC/MS     52 - 328 ng/mL 154  Z-Score (Female)     -2.0 - 2.0 SD 0.2  Prolactin, Total     ng/mL 55.0 (H)  Prolactin, Monomeric     3.2 - 25.2 ng/mL 45.6 (H)  TSH     0.35 - 4.50 uIU/mL 4.15  T4,Free(Direct)     0.60 - 1.60 ng/dL 0.75  Triiodothyronine,Free,Serum     2.3 - 4.2 pg/mL 4.6 (H)   Cortisol, Plasma     ug/dL 19.6  C206 ACTH     6 - 50 pg/mL 188 (H)   Both monomeric and total prolactin levels are high, although not as high as before. We can still start cabergoline at 0.25 mg once a week and recheck her prolactin level in 2 months. She does need a pituitary MRI, which I will order. Her ACTH is high and I am not quite sure why. Cortisol level is normal. I would like to check a dexamethasone suppression test. Will discussed with patient to come back to have this done. Her free T3 slightly high, with normal TSH and free T4. I plan to repeat this also when she comes back in 2 months.  Component     Latest Ref Rng & Units 10/31/2016  Cortisol - AM     mcg/dL 0.6 (  L)  Dexamethasone, Serum     ng/dL 538  Normal dexamethasone suppression test. No signs of Cushing disease.  CLINICAL DATA:  Hyperprolactinemia.  Chronic headaches.  Creatinine was obtained on site at [facility] at [address]  Results: Creatinine 0.9 mg/dL.  EXAM: MRI HEAD WITHOUT AND WITH CONTRAST  TECHNIQUE: Multiplanar, multiecho pulse sequences of the brain and surrounding structures were obtained without and with intravenous contrast.  CONTRAST:  19 mL MultiHance  COMPARISON:  None.  FINDINGS: Brain: There is no evidence of acute infarct, intracranial hemorrhage, mass, midline shift, or extra-axial fluid collection. The ventricles and sulci are normal. The brain is normal in signal. No abnormal enhancement is identified.  Dedicated pituitary protocol imaging was performed. There is a partially empty sella configuration with moderate sellar expansion and with a small amount of pituitary tissue along the floor of the sella. There is asymmetry of the left aspect of the pituitary gland with a 2 mm round focus of hypoenhancement (Series 21, image 7 and series 18, image 7) and with focal depression of the floor of the sella in this location. The gland otherwise  enhances homogeneously. Infundibulum is midline. The optic chiasm and cavernous sinuses are unremarkable.  Vascular: Major intracranial vascular flow voids are preserved.  Skull and upper cervical spine: Mildly diminished bone marrow signal intensity diffusely, nonspecific though can be seen with anemia, smoking, and obesity. No suspicious focal marrow lesion is identified.  Sinuses/Orbits: Unremarkable orbits. Minimal mucosal thickening in the paranasal sinuses. Clear mastoid air cells.  Other: None.  IMPRESSION: 1. Partially empty sella. 2 mm hypoenhancing focus in the left pituitary gland suspicious for a microadenoma. 2. Otherwise unremarkable appearance of the brain.  Electronically Signed   By: Logan Bores M.D.   On: 11/02/2016 15:06  Pituitary microadenoma, suspicious for prolactinoma. Partially empty sella, most likely incidental finding. We'll continue cabergoline and plan to repeat a prolactin level 2 months after she started the medication.   Philemon Kingdom, MD PhD [facility] Endocrinology

## 2016-10-03 ENCOUNTER — Other Ambulatory Visit (INDEPENDENT_AMBULATORY_CARE_PROVIDER_SITE_OTHER): Payer: Managed Care, Other (non HMO)

## 2016-10-03 DIAGNOSIS — E221 Hyperprolactinemia: Secondary | ICD-10-CM | POA: Diagnosis not present

## 2016-10-03 DIAGNOSIS — E270 Other adrenocortical overactivity: Secondary | ICD-10-CM

## 2016-10-03 LAB — T4, FREE: FREE T4: 0.75 ng/dL (ref 0.60–1.60)

## 2016-10-03 LAB — TSH: TSH: 4.15 u[IU]/mL (ref 0.35–4.50)

## 2016-10-03 LAB — CORTISOL: Cortisol, Plasma: 19.6 ug/dL

## 2016-10-03 LAB — T3, FREE: T3, Free: 4.6 pg/mL — ABNORMAL HIGH (ref 2.3–4.2)

## 2016-10-07 LAB — ACTH: C206 ACTH: 188 pg/mL — ABNORMAL HIGH (ref 6–50)

## 2016-10-08 LAB — PROLACTIN, TOTAL AND MONOMERIC
Prolactin, Monomeric: 45.6 ng/mL — ABNORMAL HIGH (ref 3.2–25.2)
Prolactin, Total: 55 ng/mL — ABNORMAL HIGH

## 2016-10-08 LAB — INSULIN-LIKE GROWTH FACTOR
IGF-I, LC/MS: 154 ng/mL (ref 52–328)
Z-SCORE (FEMALE): 0.2 {STDV} (ref ?–2.0)

## 2016-10-09 ENCOUNTER — Encounter: Payer: Self-pay | Admitting: Internal Medicine

## 2016-10-09 MED ORDER — CABERGOLINE 0.5 MG PO TABS: 0.2500 mg | ORAL_TABLET | ORAL | 1 refills | Status: DC

## 2016-10-09 MED ORDER — DEXAMETHASONE 1 MG PO TABS: ORAL_TABLET | ORAL | 0 refills | Status: DC

## 2016-10-31 ENCOUNTER — Other Ambulatory Visit: Payer: Managed Care, Other (non HMO)

## 2016-10-31 DIAGNOSIS — E270 Other adrenocortical overactivity: Secondary | ICD-10-CM

## 2016-11-01 LAB — CORTISOL-AM, BLOOD: CORTISOL - AM: 0.6 ug/dL — AB

## 2016-11-02 ENCOUNTER — Ambulatory Visit
Admission: RE | Admit: 2016-11-02 | Discharge: 2016-11-02 | Disposition: A | Discharge status: Home / Self Care | Payer: Managed Care, Other (non HMO) | Source: Ambulatory Visit | Attending: Internal Medicine | Admitting: Internal Medicine

## 2016-11-02 MED ORDER — GADOBENATE DIMEGLUMINE 529 MG/ML IV SOLN
19.0000 mL | Freq: Once | INTRAVENOUS | Status: AC | PRN
  Administered 2016-11-02: 19 mL via INTRAVENOUS

## 2016-11-04 LAB — DEXAMETHASONE, BLOOD: Dexamethasone, Serum: 538 ng/dL

## 2016-11-06 ENCOUNTER — Encounter: Payer: Self-pay | Admitting: Internal Medicine

## 2016-11-10 ENCOUNTER — Other Ambulatory Visit: Payer: Self-pay | Admitting: Endocrinology

## 2016-11-10 ENCOUNTER — Telehealth: Payer: Self-pay

## 2016-11-10 MED ORDER — CABERGOLINE 0.5 MG PO TABS: 0.2500 mg | ORAL_TABLET | ORAL | 1 refills | Status: DC

## 2016-11-10 NOTE — Telephone Encounter (Signed)
Called patient and gave lab results. Questions sent to Dr.Gherghe to answer when she returns. [name]

## 2016-11-17 NOTE — Addendum Note (Signed)
Addended by: Philemon Kingdom on: 11/17/2016 02:22 PM   Modules accepted: Orders

## 2017-02-04 ENCOUNTER — Encounter: Payer: Self-pay | Admitting: Internal Medicine

## 2017-04-06 ENCOUNTER — Ambulatory Visit (INDEPENDENT_AMBULATORY_CARE_PROVIDER_SITE_OTHER): Payer: Managed Care, Other (non HMO) | Admitting: Internal Medicine

## 2017-04-06 ENCOUNTER — Encounter: Payer: Self-pay | Admitting: Internal Medicine

## 2017-04-06 VITALS — BP 132/82 | HR 89 | Wt 209.0 lb

## 2017-04-06 DIAGNOSIS — R7989 Other specified abnormal findings of blood chemistry: Secondary | ICD-10-CM

## 2017-04-06 DIAGNOSIS — D352 Benign neoplasm of pituitary gland: Secondary | ICD-10-CM

## 2017-04-06 DIAGNOSIS — E221 Hyperprolactinemia: Secondary | ICD-10-CM | POA: Diagnosis not present

## 2017-04-06 DIAGNOSIS — E236 Other disorders of pituitary gland: Secondary | ICD-10-CM

## 2017-04-06 MED ORDER — CABERGOLINE 0.5 MG PO TABS: 0.2500 mg | ORAL_TABLET | ORAL | 2 refills | Status: DC

## 2017-04-06 NOTE — Patient Instructions (Signed)
Please continue Cabergoline 0.25 mg weekly.  Come back for labs at 8 am, fasting.  Please come back for a follow-up appointment in 6 months.

## 2017-04-06 NOTE — Progress Notes (Signed)
Patient ID: Tracy Johns, female   DOB: [birth year], 41 y.o.   MRN: [id number]    HPI  Tracy Johns is a 41 y.o.-year-old female, returning for follow-up for hyperprolactinemia, pituitary microadenoma, empty sella.  Last visit 6 months ago.  Reviewed and addended history: Pt. has been found to have a high prolactin level in 2012 [facility]) >> saw endocrinology >> Prolactin 300s >> A pituitary MRI was checked >> negative for a pituitary tumor >> started on Cabergoline once a week >> Prolactin decreased >> menses returned.   She then moved to [place] >> was homeless for a period of time, a lot of stress. She started again to have irregular menses. Now she and boyfriend would like to get pregnant (She had tubal ligation in 2002 >> will try to have it reversed) >> saw PCP >> In 07/2016, prolactin was 101.7. And LH was 6.9, FSH 9.2, both normal. She had repeat investigation in 08/2016:  Prolactin was still elevated, at 108. A TSH level, CMP, CBC were all normal.  After last visit, we checked a pituitary MRI (11/02/2016) and this showed: 1. Partially empty sella. 2 mm hypoenhancing focus in the left pituitary gland suspicious for a microadenoma. 2. Otherwise unremarkable appearance of the brain.  Pituitary microadenoma, suspicious for prolactinoma. Partially empty sella, most likely incidental finding.   We also checked her pituitary hormone levels: Component     Latest Ref Rng & Units 10/03/2016  IGF-I, LC/MS     52 - 328 ng/mL 154  Z-Score (Female)     -2.0 - 2.0 SD 0.2  Prolactin, Total     ng/mL 55.0 (H)  Prolactin, Monomeric     3.2 - 25.2 ng/mL 45.6 (H)  TSH     0.35 - 4.50 uIU/mL 4.15  T4,Free(Direct)     0.60 - 1.60 ng/dL 0.75  Triiodothyronine,Free,Serum     2.3 - 4.2 pg/mL 4.6 (H)  Cortisol, Plasma     ug/dL 19.6  C206 ACTH     6 - 50 pg/mL 188 (H)   Her free T3 slightly high, with normal TSH and free T4. Both monomeric and total prolactin levels are high, although not as  high as before.  Her ACTH was high with a normal cortisol level.  A dexamethasone suppression test was normal, with no sign of Cushing sd.:  Component     Latest Ref Rng & Units 10/31/2016  Cortisol - AM     mcg/dL 0.6 (L)  Dexamethasone, Serum     ng/dL 538   We started cabergoline 0.25 mg once a week.  She has monthly cycles.   Pt c/o: - + HAs/migraines >> better - more rare, last 1 mo ago - no galactorrhea, but tension in breasts - + weight gain - + fatigue - resolved irregular menses - they initiallystarted at 41 y/o, along with hot flushes  I reviewed pt's thyroid tests: Lab Results  Component Value Date   TSH 4.15 10/03/2016   TSH 1.620 08/12/2016   FREET4 0.75 10/03/2016    Lab Results  Component Value Date   T3FREE 4.6 (H) 10/03/2016   Pt. also has a history of seizure and vaso-vagal syncope >> stopped breathing - during an endometrial Bx. The syncopal episodes happen with acute stress/pain.  ROS: Constitutional: + see HPI Eyes: no blurry vision, no xerophthalmia ENT: no sore throat, no nodules palpated in throat, no dysphagia, no odynophagia, no hoarseness Cardiovascular: no CP/no SOB/no palpitations/no leg swelling Respiratory: no cough/no SOB/no wheezing  Gastrointestinal: no N/no V/no D/no C/no acid reflux Musculoskeletal: + muscle aches/no joint aches Skin: no rashes, no hair loss Neurological: no tremors/no numbness/no tingling/no dizziness, + HA  I reviewed pt's medications, allergies, PMH, social hx, family hx, and changes were documented in the history of present illness. Otherwise, unchanged from my initial visit note.  Past Medical History:  Diagnosis Date  . Hypercholesterolemia   . Hypertension   . Migraines   . Seizures ([place])    r/t biopsy   Past Surgical History:  Procedure Laterality Date  . CESAREAN SECTION    . TUBAL LIGATION     Social History   Social History  . Marital status: Single    Spouse name: N/A  . Number of children: 4    Occupational History  . CSR for [employer]   Social History Main Topics  . Smoking status: Current Every Day Smoker    Packs/day: 1.5     Types: Cigarettes  . Smokeless tobacco: Never Used  . Alcohol use Yes     Comment: wine, beer 3x a week  . Drug use: No   Current Outpatient Medications on File Prior to Visit  Medication Sig Dispense Refill  . cabergoline (DOSTINEX) 0.5 MG tablet Take 0.5 tablets (0.25 mg total) by mouth 2 (two) times a week. 10 tablet 1  . dexamethasone (DECADRON) 1 MG tablet Take 1 tablet by mouth once at 11 pm, before coming for labs at 8 am the next morning 1 tablet 0  . hydrochlorothiazide (HYDRODIURIL) 12.5 MG tablet Take 1 tablet (12.5 mg total) by mouth daily. 30 tablet 2  . lisinopril (PRINIVIL,ZESTRIL) 10 MG tablet Take 1 tablet (10 mg total) by mouth daily. 30 tablet 2  . SUMAtriptan (IMITREX) 25 MG tablet TK 1 T PO BID. MAY REPEAT IN 2 H IF HA PERSISTS OR RECURS. NO MORE THAN 2 TS PER DAY  1  . cyclobenzaprine (FLEXERIL) 10 MG tablet TAKE 1 TABLET BY MOUTH AT BEDTIME (Patient not taking: Reported on 10/02/2016) 10 tablet 0  . omeprazole (PRILOSEC) 40 MG capsule Take 1 capsule (40 mg total) by mouth daily. (Patient not taking: Reported on 10/02/2016) 30 capsule 3   No current facility-administered medications on file prior to visit.    Allergies  Allergen Reactions  . Percocet [Oxycodone-Acetaminophen] Itching and Other (See Comments)    Body feeling like it's burning    She has FH of: DM, HTN, HL, heart ds., cancer  PE: BP 132/82 (BP Location: Left Arm, Patient Position: Sitting)   Pulse 89   Wt 209 lb (94.8 kg)   LMP 03/27/2017   SpO2 96%   BMI 37.62 kg/m  Wt Readings from Last 3 Encounters:  04/06/17 209 lb (94.8 kg)  10/02/16 202 lb (91.6 kg)  08/07/16 202 lb 6.4 oz (91.8 kg)   Constitutional: overweight, in NAD Eyes: PERRLA, EOMI, no exophthalmos ENT: moist mucous membranes, no thyromegaly, no cervical lymphadenopathy Cardiovascular:  RRR, No MRG Respiratory: CTA B Gastrointestinal: abdomen soft, NT, ND, BS+ Musculoskeletal: no deformities, strength intact in all 4 Skin: moist, warm, no rashes Neurological: no tremor with outstretched hands, DTR normal in all 4  ASSESSMENT: 1. Hyperprolactinemia/2. prolactinoma  3. Empty sella  4.  Elevated ACTH  PLAN:   1/2. Patient with persistent high prolactin levels, without the evidence of a pituitary tumor per MRI in 2012 , but with a 2 mm pituitary adenoma on MRI from 10/2016. This is a new dx since I last saw  her. We discussed about the fact that the tumor is small and these are usually insidious andbenign.  - The rest of her pituitary hormones were normal except a high ACTH, however, with normal cortisol level  - We started cabergoline 0.25 mg weekly >> discussed that this can also shrink the size of the tumor - she started to have regular menses since starting Cabergoline - We will repeat her prolactin level at next lab draw  2.  Empty sella - new dx since I last saw her - Incidental finding on MRI to investigate for pituitary tumor - Hormonal workup has been negative except for above - No further investigation is needed for this  4.  Elevated ACTH - Patient had a high ACTH, however, with normal cortisol level  - We will check a dexamethasone suppression test and the cortisol returned suppressed, at 0.8, ruling out Cushing syndrome - I would like to repeat her cortisol and ACTH level.  We will also check a DHEAS. - She will return for these labs, fasting  Orders Placed This Encounter  Procedures  . Prolactin  . TSH  . T4, free  . T3, free  . Cortisol  . ACTH  . DHEA-Sulfate, Serum    Philemon Kingdom, MD PhD [facility] Endocrinology

## 2017-04-08 ENCOUNTER — Other Ambulatory Visit: Payer: Managed Care, Other (non HMO)

## 2017-05-03 ENCOUNTER — Encounter (HOSPITAL_COMMUNITY): Payer: Self-pay

## 2017-05-03 ENCOUNTER — Emergency Department (HOSPITAL_COMMUNITY): Payer: Managed Care, Other (non HMO)

## 2017-05-03 ENCOUNTER — Other Ambulatory Visit: Payer: Self-pay

## 2017-05-03 ENCOUNTER — Emergency Department (HOSPITAL_COMMUNITY)
Admission: EM | Admit: 2017-05-03 | Discharge: 2017-05-03 | Disposition: A | Discharge status: Home / Self Care | Payer: Managed Care, Other (non HMO) | Attending: Emergency Medicine | Admitting: Emergency Medicine

## 2017-05-03 DIAGNOSIS — N200 Calculus of kidney: Secondary | ICD-10-CM | POA: Diagnosis not present

## 2017-05-03 DIAGNOSIS — E7800 Pure hypercholesterolemia, unspecified: Secondary | ICD-10-CM | POA: Diagnosis not present

## 2017-05-03 DIAGNOSIS — F17210 Nicotine dependence, cigarettes, uncomplicated: Secondary | ICD-10-CM | POA: Insufficient documentation

## 2017-05-03 DIAGNOSIS — I10 Essential (primary) hypertension: Secondary | ICD-10-CM | POA: Insufficient documentation

## 2017-05-03 DIAGNOSIS — Z79899 Other long term (current) drug therapy: Secondary | ICD-10-CM | POA: Insufficient documentation

## 2017-05-03 DIAGNOSIS — R1031 Right lower quadrant pain: Secondary | ICD-10-CM | POA: Diagnosis present

## 2017-05-03 LAB — COMPREHENSIVE METABOLIC PANEL
ALBUMIN: 4.4 g/dL (ref 3.5–5.0)
ALT: 23 U/L (ref 14–54)
AST: 22 U/L (ref 15–41)
Alkaline Phosphatase: 85 U/L (ref 38–126)
Anion gap: 7 (ref 5–15)
BILIRUBIN TOTAL: 0.6 mg/dL (ref 0.3–1.2)
BUN: 13 mg/dL (ref 6–20)
CO2: 26 mmol/L (ref 22–32)
Calcium: 9.6 mg/dL (ref 8.9–10.3)
Chloride: 105 mmol/L (ref 101–111)
Creatinine, Ser: 0.96 mg/dL (ref 0.44–1.00)
GFR calc Af Amer: >60 mL/min (ref >60)
GFR calc non Af Amer: >60 mL/min (ref >60)
GLUCOSE: 126 mg/dL — AB (ref 65–99)
POTASSIUM: 4 mmol/L (ref 3.5–5.1)
SODIUM: 138 mmol/L (ref 135–145)
TOTAL PROTEIN: 8 g/dL (ref 6.5–8.1)

## 2017-05-03 LAB — URINALYSIS, ROUTINE W REFLEX MICROSCOPIC
Bilirubin Urine: NEGATIVE
Glucose, UA: NEGATIVE mg/dL
Ketones, ur: 5 mg/dL — AB
Leukocytes, UA: NEGATIVE
NITRITE: NEGATIVE
PROTEIN: 100 mg/dL — AB
SPECIFIC GRAVITY, URINE: 1.02 (ref 1.005–1.030)
pH: 5 (ref 5.0–8.0)

## 2017-05-03 LAB — I-STAT BETA HCG BLOOD, ED (MC, WL, AP ONLY)

## 2017-05-03 LAB — CBC
HEMATOCRIT: 39.5 % (ref 36.0–46.0)
Hemoglobin: 13.1 g/dL (ref 12.0–15.0)
MCH: 31.1 pg (ref 26.0–34.0)
MCHC: 33.2 g/dL (ref 30.0–36.0)
MCV: 93.8 fL (ref 78.0–100.0)
Platelets: 357 10*3/uL (ref 150–400)
RBC: 4.21 MIL/uL (ref 3.87–5.11)
RDW: 12.7 % (ref 11.5–15.5)
WBC: 14.7 10*3/uL — ABNORMAL HIGH (ref 4.0–10.5)

## 2017-05-03 LAB — LIPASE, BLOOD: Lipase: 24 U/L (ref 11–51)

## 2017-05-03 MED ORDER — SODIUM CHLORIDE 0.9 % IV BOLUS (SEPSIS)
1000.0000 mL | Freq: Once | INTRAVENOUS | Status: AC
  Administered 2017-05-03: 1000 mL via INTRAVENOUS

## 2017-05-03 MED ORDER — KETOROLAC TROMETHAMINE 30 MG/ML IJ SOLN
30.0000 mg | Freq: Once | INTRAMUSCULAR | Status: AC
  Administered 2017-05-03: 30 mg via INTRAVENOUS
  Filled 2017-05-03: qty 1

## 2017-05-03 MED ORDER — ONDANSETRON 4 MG PO TBDP: 4.0000 mg | ORAL_TABLET | Freq: Three times a day (TID) | ORAL | 0 refills | Status: DC | PRN

## 2017-05-03 MED ORDER — TAMSULOSIN HCL 0.4 MG PO CAPS: 0.4000 mg | ORAL_CAPSULE | Freq: Every day | ORAL | 0 refills | Status: DC

## 2017-05-03 MED ORDER — HYDROCODONE-ACETAMINOPHEN 5-325 MG PO TABS: 1.0000 | ORAL_TABLET | Freq: Four times a day (QID) | ORAL | 0 refills | Status: DC | PRN

## 2017-05-03 MED ORDER — ONDANSETRON 4 MG PO TBDP
4.0000 mg | ORAL_TABLET | Freq: Once | ORAL | Status: AC
  Administered 2017-05-03: 4 mg via ORAL
  Filled 2017-05-03: qty 1

## 2017-05-03 MED ORDER — HYDROCODONE-ACETAMINOPHEN 5-325 MG PO TABS
1.0000 | ORAL_TABLET | Freq: Once | ORAL | Status: AC
  Administered 2017-05-03: 1 via ORAL
  Filled 2017-05-03: qty 1

## 2017-05-03 NOTE — ED Notes (Signed)
Pt verbalized understanding of discharge paperwork and prescriptions.  °

## 2017-05-03 NOTE — ED Provider Notes (Signed)
[facility] EMERGENCY DEPARTMENT Provider Note   CSN: [id number] Arrival date & time: 05/03/17  0234     History   Chief Complaint Chief Complaint  Patient presents with  . Back Pain  . Abdominal Pain    HPI Tracy Johns is a 42 y.o. female.  HPI  This is a 41 year old female with a history of hypertension, seizures, kidney stones who presents with back pain and right lower quadrant pain.  Patient reports onset of symptoms after hitting her back on headboard.  She states that she drank "a little too much" last night.  She states that she was horsing around with her husband.  She now is having 6 out of 10 back pain that radiates into the right lower quadrant.  It comes and goes.  She does have a history of kidney stones and states that it is kind of similar.  She is currently on her menstrual period so is unsure whether she has blood in her urine.  She reports urgency without frequency.  Denies weakness, numbness, tingling of the lower extremities.  Does report 2 episodes of nonbilious, nonbloody emesis.  Past Medical History:  Diagnosis Date  . Hypercholesterolemia   . Hypertension   . Migraines   . Seizures ([place])    r/t biopsy    Patient Active Problem List   Diagnosis Date Noted  . Empty sella ([place]) 04/06/2017  . Prolactinoma ([place]) 04/06/2017  . High serum adrenocorticotropic hormone (ACTH) 04/06/2017  . Hyperprolactinemia ([place]) 10/02/2016    Past Surgical History:  Procedure Laterality Date  . CESAREAN SECTION    . TUBAL LIGATION      OB History    No data available       Home Medications    Prior to Admission medications   Medication Sig Start Date End Date Taking? Authorizing Provider  cabergoline (DOSTINEX) 0.5 MG tablet Take 0.5 tablets (0.25 mg total) by mouth once a week. 04/06/17   Philemon Kingdom, MD  cyclobenzaprine (FLEXERIL) 10 MG tablet TAKE 1 TABLET BY MOUTH AT BEDTIME Patient not taking: Reported on 10/02/2016 08/15/16    Clent Demark, PA-C  dexamethasone (DECADRON) 1 MG tablet Take 1 tablet by mouth once at 11 pm, before coming for labs at 8 am the next morning 10/09/16   Philemon Kingdom, MD  hydrochlorothiazide (HYDRODIURIL) 12.5 MG tablet Take 1 tablet (12.5 mg total) by mouth daily. 07/10/16   Clent Demark, PA-C  lisinopril (PRINIVIL,ZESTRIL) 10 MG tablet Take 1 tablet (10 mg total) by mouth daily. 07/10/16   Clent Demark, PA-C  omeprazole (PRILOSEC) 40 MG capsule Take 1 capsule (40 mg total) by mouth daily. Patient not taking: Reported on 10/02/2016 08/07/16   Clent Demark, PA-C  SUMAtriptan (IMITREX) 25 MG tablet TK 1 T PO BID. MAY REPEAT IN 2 H IF HA PERSISTS OR RECURS. NO MORE THAN 2 TS PER DAY 07/10/16   [provider]    Family History No family history on file.  Social History Social History   Tobacco Use  . Smoking status: Current Every Day Smoker    Packs/day: 0.25    Types: Cigarettes  . Smokeless tobacco: Never Used  Substance Use Topics  . Alcohol use: Yes    Comment: occ  . Drug use: No     Allergies   Percocet [oxycodone-acetaminophen]   Review of Systems Review of Systems  Constitutional: Negative for fever.  Gastrointestinal: Positive for abdominal pain, nausea and vomiting. Negative  for diarrhea.  Genitourinary: Positive for flank pain. Negative for dysuria.  Musculoskeletal: Positive for back pain.  All other systems reviewed and are negative.    Physical Exam Updated Vital Signs BP 140/87   Pulse 84   Temp 97.7 F (36.5 C) (Oral)   Resp 18   SpO2 99%   Physical Exam  Constitutional: She is oriented to person, place, and time. She appears well-developed and well-nourished.  Overweight, no acute distress  HENT:  Head: Normocephalic and atraumatic.  Abrasions noted over the face  Neck: Neck supple.  Cardiovascular: Normal rate, regular rhythm and normal heart sounds.  Pulmonary/Chest: Effort normal. No respiratory distress. She  has no wheezes.  Abdominal: Soft. Bowel sounds are normal. There is no tenderness.  No abdominal bruising noted  Genitourinary:  Genitourinary Comments: No CVA tenderness  Neurological: She is alert and oriented to person, place, and time.  Skin: Skin is warm and dry.  No contusions noted over the flank or abdomen  Psychiatric: She has a normal mood and affect.  Nursing note and vitals reviewed.    ED Treatments / Results  Labs (all labs ordered are listed, but only abnormal results are displayed) Labs Reviewed  COMPREHENSIVE METABOLIC PANEL - Abnormal; Notable for the following components:      Result Value   Glucose, Bld 126 (*)    All other components within normal limits  CBC - Abnormal; Notable for the following components:   WBC 14.7 (*)    All other components within normal limits  URINALYSIS, ROUTINE W REFLEX MICROSCOPIC - Abnormal; Notable for the following components:   Color, Urine AMBER (*)    APPearance CLOUDY (*)    Hgb urine dipstick LARGE (*)    Ketones, ur 5 (*)    Protein, ur 100 (*)    Bacteria, UA RARE (*)    Squamous Epithelial / LPF 0-5 (*)    All other components within normal limits  LIPASE, BLOOD  I-STAT BETA HCG BLOOD, ED ([facility], [facility], [facility] ONLY)    EKG  EKG Interpretation None       Radiology Ct Renal Stone Study  Result Date: 05/03/2017 CLINICAL DATA:  Acute onset of right flank pain. EXAM: CT ABDOMEN AND PELVIS WITHOUT CONTRAST TECHNIQUE: Multidetector CT imaging of the abdomen and pelvis was performed following the standard protocol without IV contrast. COMPARISON:  Lumbar spine radiographs performed 07/14/2016 FINDINGS: Lower chest: The visualized lung bases are grossly clear. The visualized portions of the mediastinum are unremarkable. Hepatobiliary: The liver is unremarkable in appearance. The gallbladder is unremarkable in appearance. The common bile duct remains normal in caliber. Pancreas: The pancreas is within normal limits. Spleen: The  spleen is unremarkable in appearance. Adrenals/Urinary Tract: The adrenal glands are unremarkable in appearance. Minimal right-sided hydronephrosis is noted, with an obstructing 7 x 7 mm stone at the proximal right ureter, just below the right renal pelvis. A nonobstructing 9 mm stone is noted at the interpole region of the right kidney. The left kidney is unremarkable in appearance. No significant perinephric stranding is appreciated. Stomach/Bowel: The stomach is unremarkable in appearance. The small bowel is within normal limits. The appendix is normal in caliber, without evidence of appendicitis. The colon is unremarkable in appearance. Vascular/Lymphatic: Scattered calcification is seen along the aortic bifurcation. The abdominal aorta is otherwise grossly unremarkable. The inferior vena cava is grossly unremarkable. No retroperitoneal lymphadenopathy is seen. No pelvic sidewall lymphadenopathy is identified. Reproductive: The bladder is mildly distended and grossly unremarkable.  The uterus is unremarkable in appearance. The ovaries are grossly symmetric. No suspicious adnexal masses are seen. Other: No additional soft tissue abnormalities are seen. Musculoskeletal: No acute osseous abnormalities are identified. The visualized musculature is unremarkable in appearance. IMPRESSION: 1. Minimal right-sided hydronephrosis, with an obstructing 7 x 7 mm stone at the proximal right ureter, just below the right renal pelvis. 2. Nonobstructing 9 mm stone at the interpole region of the right kidney. Electronically Signed   By: Garald Balding M.D.   On: 05/03/2017 06:24    Procedures Procedures (including critical care time)  Medications Ordered in ED Medications  HYDROcodone-acetaminophen (NORCO/VICODIN) 5-325 MG per tablet 1 tablet (1 tablet Oral Given 05/03/17 0549)  ondansetron (ZOFRAN-ODT) disintegrating tablet 4 mg (4 mg Oral Given 05/03/17 0549)  sodium chloride 0.9 % bolus 1,000 mL (1,000 mLs Intravenous  New Bag/Given 05/03/17 0648)  ketorolac (TORADOL) 30 MG/ML injection 30 mg (30 mg Intravenous Given 05/03/17 0651)     Initial Impression / Assessment and Plan / ED Course  I have reviewed the triage vital signs and the nursing notes.  Pertinent labs & imaging results that were available during my care of the patient were reviewed by me and considered in my medical decision making (see chart for details).     Patient presents with right-sided back and abdominal pain.  Onset after hitting her back on a footboard.  No obvious signs of trauma on exam.  Vital signs are reassuring.  Could be related to trauma; however, also highly suspicious for possible kidney stones.  She has a history.  However, given trauma, will obtain a CT stone study which will also provide a good evaluation for any possible trauma.  CT stone study does show a 7 x 7 mm stone.  On recheck, patient feels better.  Fluids were ordered.  Patient was dosed Toradol.  I discussed with patient that she may not pass the stone on her own.  She has no signs or symptoms of infection this time.  She wishes a trial of passage.  She is comfortable at this time and tolerating fluids.  Will discharge home with pain medication and Flomax.  Instructed to follow-up in the ED if she has new or worsening pain, fevers, or any other symptoms.  Follow-up with urology provided.  However, discussed with patient that given the upcoming holiday, she may have difficulty following up closely until after Christmas.  After history, exam, and medical workup I feel the patient has been appropriately medically screened and is safe for discharge home. Pertinent diagnoses were discussed with the patient. Patient was given return precautions.   Final Clinical Impressions(s) / ED Diagnoses   Final diagnoses:  Kidney stone    ED Discharge Orders    None       Merryl Hacker, MD 05/03/17 [phone]

## 2017-05-03 NOTE — ED Triage Notes (Signed)
Pt states that she has been drinking tonight fell and hit her lower back on edge of bed, since n/v X 2 and stomach pain.

## 2017-05-03 NOTE — ED Notes (Signed)
Unable to draw labs x1 

## 2017-05-03 NOTE — ED Notes (Signed)
Pt unable to void at this time. 

## 2017-05-03 NOTE — Discharge Instructions (Signed)
You were seen today for abdominal pain and back pain.  You have a 7 mm stone that is likely causing her pain.  This is a fairly large stone.  There is a chance she may pass it on her own; however, this may require a stent.  Given that your pain is well controlled, will do a trial of passage.  Take Flomax, pain medication, and nausea medication as needed.  If you have new or worsening symptoms over the next few days including fever, worsening pain or any new or worsening symptoms he will need to return to the ED for urgent urology evaluation.

## 2017-05-06 ENCOUNTER — Encounter (HOSPITAL_COMMUNITY): Payer: Self-pay

## 2017-05-06 ENCOUNTER — Emergency Department (HOSPITAL_COMMUNITY)
Admission: EM | Admit: 2017-05-06 | Discharge: 2017-05-06 | Disposition: A | Discharge status: Home / Self Care | Payer: Managed Care, Other (non HMO) | Attending: Emergency Medicine | Admitting: Emergency Medicine

## 2017-05-06 DIAGNOSIS — F17210 Nicotine dependence, cigarettes, uncomplicated: Secondary | ICD-10-CM | POA: Insufficient documentation

## 2017-05-06 DIAGNOSIS — I10 Essential (primary) hypertension: Secondary | ICD-10-CM | POA: Diagnosis not present

## 2017-05-06 DIAGNOSIS — Z79899 Other long term (current) drug therapy: Secondary | ICD-10-CM | POA: Insufficient documentation

## 2017-05-06 DIAGNOSIS — N200 Calculus of kidney: Secondary | ICD-10-CM | POA: Diagnosis not present

## 2017-05-06 DIAGNOSIS — R109 Unspecified abdominal pain: Secondary | ICD-10-CM | POA: Diagnosis present

## 2017-05-06 LAB — BASIC METABOLIC PANEL
ANION GAP: 9 (ref 5–15)
BUN: 10 mg/dL (ref 6–20)
CHLORIDE: 101 mmol/L (ref 101–111)
CO2: 25 mmol/L (ref 22–32)
CREATININE: 1.61 mg/dL — AB (ref 0.44–1.00)
Calcium: 9 mg/dL (ref 8.9–10.3)
GFR calc non Af Amer: 39 mL/min — ABNORMAL LOW (ref >60)
GFR, EST AFRICAN AMERICAN: 45 mL/min — AB (ref >60)
Glucose, Bld: 123 mg/dL — ABNORMAL HIGH (ref 65–99)
Potassium: 3.6 mmol/L (ref 3.5–5.1)
Sodium: 135 mmol/L (ref 135–145)

## 2017-05-06 LAB — I-STAT BETA HCG BLOOD, ED (MC, WL, AP ONLY): I-stat hCG, quantitative: <5 m[IU]/mL (ref <5)

## 2017-05-06 LAB — URINALYSIS, ROUTINE W REFLEX MICROSCOPIC
Bilirubin Urine: NEGATIVE
GLUCOSE, UA: NEGATIVE mg/dL
Ketones, ur: 80 mg/dL — AB
Leukocytes, UA: NEGATIVE
NITRITE: NEGATIVE
PROTEIN: NEGATIVE mg/dL
Specific Gravity, Urine: 1.016 (ref 1.005–1.030)
pH: 5 (ref 5.0–8.0)

## 2017-05-06 LAB — CBC
HEMATOCRIT: 35.3 % — AB (ref 36.0–46.0)
HEMOGLOBIN: 11.7 g/dL — AB (ref 12.0–15.0)
MCH: 31.2 pg (ref 26.0–34.0)
MCHC: 33.1 g/dL (ref 30.0–36.0)
MCV: 94.1 fL (ref 78.0–100.0)
Platelets: 348 10*3/uL (ref 150–400)
RBC: 3.75 MIL/uL — AB (ref 3.87–5.11)
RDW: 12.3 % (ref 11.5–15.5)
WBC: 9.4 10*3/uL (ref 4.0–10.5)

## 2017-05-06 MED ORDER — IBUPROFEN 800 MG PO TABS: 800.0000 mg | ORAL_TABLET | Freq: Three times a day (TID) | ORAL | 0 refills | Status: DC | PRN

## 2017-05-06 MED ORDER — PROMETHAZINE HCL 25 MG PO TABS: 25.0000 mg | ORAL_TABLET | Freq: Four times a day (QID) | ORAL | 0 refills | Status: DC | PRN

## 2017-05-06 MED ORDER — SODIUM CHLORIDE 0.9 % IV BOLUS (SEPSIS)
1000.0000 mL | Freq: Once | INTRAVENOUS | Status: AC
  Administered 2017-05-06: 1000 mL via INTRAVENOUS

## 2017-05-06 MED ORDER — MORPHINE SULFATE (PF) 4 MG/ML IV SOLN
6.0000 mg | Freq: Once | INTRAVENOUS | Status: AC
  Administered 2017-05-06: 6 mg via INTRAVENOUS
  Filled 2017-05-06: qty 2

## 2017-05-06 MED ORDER — HYDROCODONE-ACETAMINOPHEN 5-325 MG PO TABS: 1.0000 | ORAL_TABLET | Freq: Four times a day (QID) | ORAL | 0 refills | Status: DC | PRN

## 2017-05-06 MED ORDER — ONDANSETRON HCL 4 MG/2ML IJ SOLN
4.0000 mg | Freq: Once | INTRAMUSCULAR | Status: AC
  Administered 2017-05-06: 4 mg via INTRAVENOUS
  Filled 2017-05-06: qty 2

## 2017-05-06 NOTE — ED Provider Notes (Signed)
[facility] EMERGENCY DEPARTMENT Provider Note   CSN: [id number] Arrival date & time: 05/06/17  1610     History   Chief Complaint Chief Complaint  Patient presents with  . Flank Pain    HPI Tracy Johns is a 41 y.o. female.  HPI  41 year old female with history of kidney stones, hypertension, hypercholesterolemia, seizure presenting for evaluation of flank pain.  Patient developed right flank pain and back pain 4 days ago.  She report the initial onset of her symptom was after she hit her back on a footboard.  She was initially seen in the ER on 12/23 for her symptoms.  A CT renal stone study was obtained which demonstrated a 7 x 7 mm stone.  Patient was sent home with pain medication and Flomax.  She has been compliant with her medication.  However, she reported increasing worsening right flank pain.  Pain is sharp, stabbing with associated nausea and vomiting.  She can only tolerate small amount of fluid.  Nothing seems to make the pain better or worse.  She has been taking her pain medication as prescribed.  She denies burning urination, urinary frequency or urgency.  Past Medical History:  Diagnosis Date  . Hypercholesterolemia   . Hypertension   . Migraines   . Seizures ([place])    r/t biopsy    Patient Active Problem List   Diagnosis Date Noted  . Empty sella ([place]) 04/06/2017  . Prolactinoma ([place]) 04/06/2017  . High serum adrenocorticotropic hormone (ACTH) 04/06/2017  . Hyperprolactinemia ([place]) 10/02/2016    Past Surgical History:  Procedure Laterality Date  . CESAREAN SECTION    . TUBAL LIGATION      OB History    No data available       Home Medications    Prior to Admission medications   Medication Sig Start Date End Date Taking? Authorizing Provider  cabergoline (DOSTINEX) 0.5 MG tablet Take 0.5 tablets (0.25 mg total) by mouth once a week. 04/06/17   Philemon Kingdom, MD  cyclobenzaprine (FLEXERIL) 10 MG tablet TAKE 1 TABLET  BY MOUTH AT BEDTIME Patient not taking: Reported on 10/02/2016 08/15/16   Clent Demark, PA-C  dexamethasone (DECADRON) 1 MG tablet Take 1 tablet by mouth once at 11 pm, before coming for labs at 8 am the next morning 10/09/16   Philemon Kingdom, MD  hydrochlorothiazide (HYDRODIURIL) 12.5 MG tablet Take 1 tablet (12.5 mg total) by mouth daily. 07/10/16   Clent Demark, PA-C  HYDROcodone-acetaminophen (NORCO/VICODIN) 5-325 MG tablet Take 1-2 tablets by mouth every 6 (six) hours as needed. 05/03/17   Horton, Barbette Hair, MD  lisinopril (PRINIVIL,ZESTRIL) 10 MG tablet Take 1 tablet (10 mg total) by mouth daily. 07/10/16   Clent Demark, PA-C  omeprazole (PRILOSEC) 40 MG capsule Take 1 capsule (40 mg total) by mouth daily. Patient not taking: Reported on 10/02/2016 08/07/16   Clent Demark, PA-C  ondansetron [facility] ODT) 4 MG disintegrating tablet Take 1 tablet (4 mg total) by mouth every 8 (eight) hours as needed for nausea or vomiting. 05/03/17   Horton, Barbette Hair, MD  SUMAtriptan (IMITREX) 25 MG tablet TK 1 T PO BID. MAY REPEAT IN 2 H IF HA PERSISTS OR RECURS. NO MORE THAN 2 TS PER DAY 07/10/16   [provider]  tamsulosin (FLOMAX) 0.4 MG CAPS capsule Take 1 capsule (0.4 mg total) by mouth daily. 05/03/17   Merryl Hacker, MD    Family History No family history  on file.  Social History Social History   Tobacco Use  . Smoking status: Current Every Day Smoker    Packs/day: 0.25    Types: Cigarettes  . Smokeless tobacco: Never Used  Substance Use Topics  . Alcohol use: Yes    Comment: occ  . Drug use: No     Allergies   Percocet [oxycodone-acetaminophen]   Review of Systems Review of Systems  All other systems reviewed and are negative.    Physical Exam Updated Vital Signs BP (!) 141/78   Pulse 70   Temp 98.3 F (36.8 C) (Oral)   Resp 18   Ht 5\' 1"  (1.549 m)   Wt 92.5 kg (204 lb)   SpO2 100%   BMI 38.55 kg/m   Physical Exam  Constitutional:  She appears well-developed and well-nourished. No distress.  HENT:  Head: Atraumatic.  Eyes: Conjunctivae are normal.  Neck: Neck supple.  Cardiovascular: Normal rate and regular rhythm.  Pulmonary/Chest: Effort normal and breath sounds normal.  Abdominal: Soft. She exhibits no distension. There is no tenderness.  Genitourinary:  Genitourinary Comments: Mild right CVA tenderness  Neurological: She is alert.  Skin: No rash noted.  Psychiatric: She has a normal mood and affect.  Nursing note and vitals reviewed.    ED Treatments / Results  Labs (all labs ordered are listed, but only abnormal results are displayed) Labs Reviewed  URINALYSIS, ROUTINE W REFLEX MICROSCOPIC - Abnormal; Notable for the following components:      Result Value   APPearance HAZY (*)    Hgb urine dipstick LARGE (*)    Ketones, ur 80 (*)    Bacteria, UA RARE (*)    Squamous Epithelial / LPF 6-30 (*)    All other components within normal limits  BASIC METABOLIC PANEL - Abnormal; Notable for the following components:   Glucose, Bld 123 (*)    Creatinine, Ser 1.61 (*)    GFR calc non Af Amer 39 (*)    GFR calc Af Amer 45 (*)    All other components within normal limits  CBC - Abnormal; Notable for the following components:   RBC 3.75 (*)    Hemoglobin 11.7 (*)    HCT 35.3 (*)    All other components within normal limits  I-STAT BETA HCG BLOOD, ED ([facility], [facility], [facility] ONLY)    EKG  EKG Interpretation None       Radiology No results found.  Procedures Procedures (including critical care time)  Medications Ordered in ED Medications  morphine 4 MG/ML injection 6 mg (6 mg Intravenous Given 05/06/17 1946)  ondansetron (ZOFRAN) injection 4 mg (4 mg Intravenous Given 05/06/17 1946)  sodium chloride 0.9 % bolus 1,000 mL (1,000 mLs Intravenous New Bag/Given 05/06/17 1949)     Initial Impression / Assessment and Plan / ED Course  I have reviewed the triage vital signs and the nursing notes.  Pertinent  labs & imaging results that were available during my care of the patient were reviewed by me and considered in my medical decision making (see chart for details).     BP (!) 157/88   Pulse 71   Temp 98.3 F (36.8 C) (Oral)   Resp 16   Ht 5\' 1"  (1.549 m)   Wt 92.5 kg (204 lb)   SpO2 100%   BMI 38.55 kg/m    Final Clinical Impressions(s) / ED Diagnoses   Final diagnoses:  Kidney stone on right side    ED Discharge Orders  Ordered    HYDROcodone-acetaminophen (NORCO/VICODIN) 5-325 MG tablet  Every 6 hours PRN     05/06/17 2035    ibuprofen (ADVIL,MOTRIN) 800 MG tablet  Every 8 hours PRN     05/06/17 2035    promethazine (PHENERGAN) 25 MG tablet  Every 6 hours PRN     05/06/17 2035     7:36 PM Patient here with right flank pain.  Had a renal stone study performed 3 days ago that demonstrated a 7 x 7 mm obstructing stone in the proximal ureter.  Her pain is still in the same location, now having nausea and vomiting and states that pain is not well controlled.  Blood work is remarkable for an elevated creatinine of 1.61 from 0.96 three days ago.  Urine with large amount of hemoglobin and urine dipstick but no evidence of UTI.  Pregnancy test is negative.  Normal white count. Pt request management for her kidney stone.  She does not feel safe going home.    8:18 PM Appreciate consultation from on call urologist Dr. Gloriann Loan who recommend pain management.  If pain is not well control, pt can be transfer to [place] and he will admit pt.  8:29 PM I discussed options with patient.  She is willing to try to control her symptoms at home with additional pain management and antinausea medication.  She understand that she can f/u with urology outpt for further care.  Pt also understand she can return if her condition worsen.  Pt report improvement of her pain with medication at this time.    In order to decrease risk of narcotic abuse. Pt's record were checked using the [place] Controlled  Substance database.     Domenic Moras, PA-C 05/06/17 2112    Domenic Moras, PA-C 05/06/17 2113    Jola Schmidt, MD 05/06/17 2155

## 2017-05-06 NOTE — ED Triage Notes (Signed)
Per Pt, Pt was seen here on Sunday for kidney stones. Was noted to have one in her right kidney. Given medication and sent home. Reported that she is not getting better. Denies blood in her urine.

## 2017-05-06 NOTE — Discharge Instructions (Signed)
You have a 7x70mm kidney stone in the right side.  Please take pain medications as prescribed as needed for  your pain.  Take Phenergan as needed for nausea.  Use a urine strainer to capture the kidney stone when urinate. Follow up with urology for further care.  Return if you have any concerns.

## 2017-05-06 NOTE — ED Notes (Signed)
Pt verbalizes understanding of d/c instructions. Pt received prescriptions. Pt ambulatory at d/c with all belongings.  

## 2017-05-14 ENCOUNTER — Other Ambulatory Visit: Payer: Self-pay

## 2017-05-14 ENCOUNTER — Encounter (HOSPITAL_COMMUNITY): Payer: Self-pay

## 2017-05-14 DIAGNOSIS — I10 Essential (primary) hypertension: Secondary | ICD-10-CM | POA: Diagnosis not present

## 2017-05-14 DIAGNOSIS — N23 Unspecified renal colic: Secondary | ICD-10-CM | POA: Diagnosis not present

## 2017-05-14 DIAGNOSIS — F17210 Nicotine dependence, cigarettes, uncomplicated: Secondary | ICD-10-CM | POA: Insufficient documentation

## 2017-05-14 DIAGNOSIS — R109 Unspecified abdominal pain: Secondary | ICD-10-CM | POA: Diagnosis present

## 2017-05-14 DIAGNOSIS — Z79899 Other long term (current) drug therapy: Secondary | ICD-10-CM | POA: Diagnosis not present

## 2017-05-14 LAB — BASIC METABOLIC PANEL
Anion gap: 8 (ref 5–15)
BUN: 9 mg/dL (ref 6–20)
CO2: 28 mmol/L (ref 22–32)
CREATININE: 1.3 mg/dL — AB (ref 0.44–1.00)
Calcium: 9.8 mg/dL (ref 8.9–10.3)
Chloride: 100 mmol/L — ABNORMAL LOW (ref 101–111)
GFR calc Af Amer: 58 mL/min — ABNORMAL LOW (ref >60)
GFR, EST NON AFRICAN AMERICAN: 50 mL/min — AB (ref >60)
GLUCOSE: 102 mg/dL — AB (ref 65–99)
POTASSIUM: 3.7 mmol/L (ref 3.5–5.1)
SODIUM: 136 mmol/L (ref 135–145)

## 2017-05-14 LAB — URINALYSIS, ROUTINE W REFLEX MICROSCOPIC
BILIRUBIN URINE: NEGATIVE
Glucose, UA: NEGATIVE mg/dL
KETONES UR: NEGATIVE mg/dL
Nitrite: POSITIVE — AB
PROTEIN: 30 mg/dL — AB
Specific Gravity, Urine: 1.024 (ref 1.005–1.030)
pH: 5 (ref 5.0–8.0)

## 2017-05-14 LAB — I-STAT BETA HCG BLOOD, ED (MC, WL, AP ONLY): I-stat hCG, quantitative: <5 m[IU]/mL (ref <5)

## 2017-05-14 LAB — CBC
HEMATOCRIT: 36.5 % (ref 36.0–46.0)
Hemoglobin: 12 g/dL (ref 12.0–15.0)
MCH: 30.9 pg (ref 26.0–34.0)
MCHC: 32.9 g/dL (ref 30.0–36.0)
MCV: 94.1 fL (ref 78.0–100.0)
PLATELETS: 451 10*3/uL — AB (ref 150–400)
RBC: 3.88 MIL/uL (ref 3.87–5.11)
RDW: 12.6 % (ref 11.5–15.5)
WBC: 11.3 10*3/uL — AB (ref 4.0–10.5)

## 2017-05-14 NOTE — ED Triage Notes (Addendum)
Per Pt, Pt is coming from home with complaints of flank pain that has continued since she got diagnoses on 12/26. Pt has reported that she has not been able to keep things down. She has been nauseous and she has noted that she has not passed the Kidney stone. Reports increased bowel movement and headaches. Denies any blood in urine or fevers.

## 2017-05-15 ENCOUNTER — Emergency Department (HOSPITAL_COMMUNITY): Payer: Managed Care, Other (non HMO)

## 2017-05-15 ENCOUNTER — Emergency Department (HOSPITAL_COMMUNITY)
Admission: EM | Admit: 2017-05-15 | Discharge: 2017-05-15 | Disposition: A | Discharge status: Home / Self Care | Payer: Managed Care, Other (non HMO) | Attending: Emergency Medicine | Admitting: Emergency Medicine

## 2017-05-15 DIAGNOSIS — N390 Urinary tract infection, site not specified: Secondary | ICD-10-CM

## 2017-05-15 DIAGNOSIS — N23 Unspecified renal colic: Secondary | ICD-10-CM

## 2017-05-15 MED ORDER — ONDANSETRON HCL 4 MG/2ML IJ SOLN
4.0000 mg | Freq: Once | INTRAMUSCULAR | Status: AC
  Administered 2017-05-15: 4 mg via INTRAVENOUS
  Filled 2017-05-15: qty 2

## 2017-05-15 MED ORDER — KETOROLAC TROMETHAMINE 30 MG/ML IJ SOLN
15.0000 mg | Freq: Once | INTRAMUSCULAR | Status: AC
  Administered 2017-05-15: 15 mg via INTRAVENOUS
  Filled 2017-05-15: qty 1

## 2017-05-15 MED ORDER — ONDANSETRON HCL 4 MG PO TABS: 4.0000 mg | ORAL_TABLET | Freq: Four times a day (QID) | ORAL | 0 refills | Status: DC

## 2017-05-15 MED ORDER — CEPHALEXIN 500 MG PO CAPS: 500.0000 mg | ORAL_CAPSULE | Freq: Two times a day (BID) | ORAL | 0 refills | Status: DC

## 2017-05-15 MED ORDER — HYDROCODONE-ACETAMINOPHEN 5-325 MG PO TABS: 1.0000 | ORAL_TABLET | ORAL | 0 refills | Status: DC | PRN

## 2017-05-15 MED ORDER — DEXTROSE 5 % IV SOLN
1.0000 g | Freq: Once | INTRAVENOUS | Status: AC
  Administered 2017-05-15: 1 g via INTRAVENOUS
  Filled 2017-05-15: qty 10

## 2017-05-15 MED ORDER — HYDROMORPHONE HCL 1 MG/ML IJ SOLN
1.0000 mg | Freq: Once | INTRAMUSCULAR | Status: AC
  Administered 2017-05-15: 1 mg via INTRAVENOUS
  Filled 2017-05-15: qty 1

## 2017-05-15 NOTE — ED Notes (Signed)
Patient transported to X-ray 

## 2017-05-15 NOTE — ED Provider Notes (Addendum)
[facility] EMERGENCY DEPARTMENT Provider Note   CSN: [id number] Arrival date & time: 05/14/17  1732     History   Chief Complaint Chief Complaint  Patient presents with  . Flank Pain    HPI Tracy Johns is a 42 y.o. female.  Patient presents to the emergency department for evaluation of persistent right flank pain.  Patient reports that she was seen on December 23 with flank pain and diagnosed with kidney stone.  She has not passed the stone.  Over the last few days she has started to feel worse.  She has not been able to eat because of nausea and persistent pain.      Past Medical History:  Diagnosis Date  . Hypercholesterolemia   . Hypertension   . Migraines   . Seizures ([place])    r/t biopsy    Patient Active Problem List   Diagnosis Date Noted  . Empty sella ([place] [address]) 04/06/2017  . Prolactinoma ([place]) 04/06/2017  . High serum adrenocorticotropic hormone (ACTH) 04/06/2017  . Hyperprolactinemia ([place]) 10/02/2016    Past Surgical History:  Procedure Laterality Date  . CESAREAN SECTION    . TUBAL LIGATION      OB History    No data available       Home Medications    Prior to Admission medications   Medication Sig Start Date End Date Taking? Authorizing Provider  albuterol (VENTOLIN HFA) 108 (90 Base) MCG/ACT inhaler Inhale 2 puffs into the lungs every 6 (six) hours as needed for wheezing or shortness of breath.    [provider]  aspirin-acetaminophen-caffeine (EXCEDRIN MIGRAINE) [phone] MG tablet Take 1-2 tablets by mouth every 6 (six) hours as needed for headache.    [provider]  cabergoline (DOSTINEX) 0.5 MG tablet Take 0.5 tablets (0.25 mg total) by mouth once a week. Patient taking differently: Take 0.25 mg by mouth every Sunday.  04/06/17   Philemon Kingdom, MD  cephALEXin (KEFLEX) 500 MG capsule Take 1 capsule (500 mg total) by mouth 2 (two) times daily. 05/15/17   Orpah Greek, MD    hydrochlorothiazide (HYDRODIURIL) 12.5 MG tablet Take 1 tablet (12.5 mg total) by mouth daily. 07/10/16   Clent Demark, PA-C  HYDROcodone-acetaminophen (NORCO/VICODIN) 5-325 MG tablet Take 1-2 tablets by mouth every 4 (four) hours as needed for moderate pain. 05/15/17   Orpah Greek, MD  ibuprofen (ADVIL,MOTRIN) 800 MG tablet Take 1 tablet (800 mg total) by mouth every 8 (eight) hours as needed for moderate pain (for headaches). 05/06/17   Domenic Moras, PA-C  lisinopril (PRINIVIL,ZESTRIL) 10 MG tablet Take 1 tablet (10 mg total) by mouth daily. 07/10/16   Clent Demark, PA-C  ondansetron (ZOFRAN ODT) 4 MG disintegrating tablet Take 1 tablet (4 mg total) by mouth every 8 (eight) hours as needed for nausea or vomiting. 05/03/17   Horton, Barbette Hair, MD  ondansetron (ZOFRAN) 4 MG tablet Take 1 tablet (4 mg total) by mouth every 6 (six) hours. 05/15/17   Orpah Greek, MD  promethazine (PHENERGAN) 25 MG tablet Take 1 tablet (25 mg total) by mouth every 6 (six) hours as needed for nausea. 05/06/17   Domenic Moras, PA-C  tamsulosin (FLOMAX) 0.4 MG CAPS capsule Take 1 capsule (0.4 mg total) by mouth daily. 05/03/17   Horton, Barbette Hair, MD    Family History No family history on file.  Social History Social History   Tobacco Use  . Smoking status: Current Every Day Smoker  Packs/day: 0.25    Types: Cigarettes  . Smokeless tobacco: Never Used  Substance Use Topics  . Alcohol use: Yes    Comment: occ  . Drug use: No     Allergies   Imitrex [sumatriptan] and Percocet [oxycodone-acetaminophen]   Review of Systems Review of Systems  Gastrointestinal: Positive for nausea.  Genitourinary: Positive for flank pain.  All other systems reviewed and are negative.    Physical Exam Updated Vital Signs BP (!) 150/84   Pulse (!) 57   Temp 98.2 F (36.8 C)   Resp (!) 22   Ht 5\' 1"  (1.549 m)   Wt 92.5 kg (204 lb)   SpO2 93%   BMI 38.55 kg/m   Physical Exam   Constitutional: She is oriented to person, place, and time. She appears well-developed and well-nourished. She appears distressed.  HENT:  Head: Normocephalic and atraumatic.  Right Ear: Hearing normal.  Left Ear: Hearing normal.  Nose: Nose normal.  Mouth/Throat: Oropharynx is clear and moist and mucous membranes are normal.  Eyes: Conjunctivae and EOM are normal. Pupils are equal, round, and reactive to light.  Neck: Normal range of motion. Neck supple.  Cardiovascular: Regular rhythm, S1 normal and S2 normal. Exam reveals no gallop and no friction rub.  No murmur heard. Pulmonary/Chest: Effort normal and breath sounds normal. No respiratory distress. She exhibits no tenderness.  Abdominal: Soft. Normal appearance and bowel sounds are normal. There is no hepatosplenomegaly. There is no tenderness. There is no rebound, no guarding, no tenderness at McBurney's point and negative Murphy's sign. No hernia.  Musculoskeletal: Normal range of motion.  Neurological: She is alert and oriented to person, place, and time. She has normal strength. No cranial nerve deficit or sensory deficit. Coordination normal. GCS eye subscore is 4. GCS verbal subscore is 5. GCS motor subscore is 6.  Skin: Skin is warm, dry and intact. No rash noted. No cyanosis.  Psychiatric: She has a normal mood and affect. Her speech is normal and behavior is normal. Thought content normal.  Nursing note and vitals reviewed.    ED Treatments / Results  Labs (all labs ordered are listed, but only abnormal results are displayed) Labs Reviewed  URINALYSIS, ROUTINE W REFLEX MICROSCOPIC - Abnormal; Notable for the following components:      Result Value   APPearance HAZY (*)    Hgb urine dipstick LARGE (*)    Protein, ur 30 (*)    Nitrite POSITIVE (*)    Leukocytes, UA MODERATE (*)    Bacteria, UA MANY (*)    Squamous Epithelial / LPF 0-5 (*)    All other components within normal limits  BASIC METABOLIC PANEL - Abnormal;  Notable for the following components:   Chloride 100 (*)    Glucose, Bld 102 (*)    Creatinine, Ser 1.30 (*)    GFR calc non Af Amer 50 (*)    GFR calc Af Amer 58 (*)    All other components within normal limits  CBC - Abnormal; Notable for the following components:   WBC 11.3 (*)    Platelets 451 (*)    All other components within normal limits  URINE CULTURE  I-STAT BETA HCG BLOOD, ED ([facility], [facility], [facility] ONLY)    EKG  EKG Interpretation None       Radiology Dg Abdomen 1 View  Result Date: 05/15/2017 CLINICAL DATA:  Right flank pain.  History of kidney stone. EXAM: ABDOMEN - 1 VIEW COMPARISON:  CT 05/03/2017  FINDINGS: 9 mm stone projects in the region of the upper right kidney, similar to prior CT. Previous right ureteral stone is not definitively visualized. No definite stones over the course of the ureters. Calcifications in the pelvis are unchanged and consistent with phleboliths. Normal bowel gas pattern without bowel obstruction. No evidence of free air. No acute osseous abnormalities. IMPRESSION: Right renal stone. No stones over the expected course of the ureters. Electronically Signed   By: Jeb Levering M.D.   On: 05/15/2017 02:59    Procedures Procedures (including critical care time)  Medications Ordered in ED Medications  cefTRIAXone (ROCEPHIN) 1 g in dextrose 5 % 50 mL IVPB (1 g Intravenous New Bag/Given 05/15/17 0357)  ondansetron (ZOFRAN) injection 4 mg (4 mg Intravenous Given 05/15/17 0225)  HYDROmorphone (DILAUDID) injection 1 mg (1 mg Intravenous Given 05/15/17 0225)  ketorolac (TORADOL) 30 MG/ML injection 15 mg (15 mg Intravenous Given 05/15/17 0358)     Initial Impression / Assessment and Plan / ED Course  I have reviewed the triage vital signs and the nursing notes.  Pertinent labs & imaging results that were available during my care of the patient were reviewed by me and considered in my medical decision making (see chart for details).     Presented to the ER  for evaluation of persistent right flank pain.  Patient has a known right ureteral stone.  She was given analgesia with some improvement.  Urinalysis today, however, did appear to be different than her previous 2 urinalysis results.  She now has positive nitrites and leukocytes with bacteria in the urine.  This, to me, raise concern for possible developing infection with kidney stone.  Case was therefore discussed with Dr. Junious Silk.  Patient's recent history current presentation and labs were reviewed.  He agreed that she should be treated with antibiotics, but as she does not have any signs of sepsis, normal vital signs, no fever, he does not feel that she requires emergent stenting or intervention at this time.  He recommends that she be discharged with analgesia and call the office today for office follow-up.  Patient was told to come to the ER immediately if she has any fever or worsening symptoms including dizziness, passing out.  Final Clinical Impressions(s) / ED Diagnoses   Final diagnoses:  Ureteral colic  Lower urinary tract infectious disease    ED Discharge Orders        Ordered    cephALEXin (KEFLEX) 500 MG capsule  2 times daily     05/15/17 0417    HYDROcodone-acetaminophen (NORCO/VICODIN) 5-325 MG tablet  Every 4 hours PRN     05/15/17 0417    ondansetron (ZOFRAN) 4 MG tablet  Every 6 hours     05/15/17 0417       Orpah Greek, MD 05/15/17 5427    Orpah Greek, MD 06/06/17 0127

## 2017-05-15 NOTE — ED Notes (Signed)
ED Provider at bedside. 

## 2017-05-17 LAB — URINE CULTURE

## 2017-05-18 ENCOUNTER — Telehealth: Payer: Self-pay | Admitting: *Deleted

## 2017-05-18 NOTE — Telephone Encounter (Signed)
Post ED Visit - Positive Culture Follow-up: Unsuccessful Patient Follow-up  Culture assessed and recommendations reviewed by:  []  Elenor Quinones, Pharm.D. []  Heide Guile, Pharm.D., BCPS AQ-ID []  Parks Neptune, Pharm.D., BCPS []  Alycia Rossetti, Pharm.D., BCPS []  [place], [place].D., BCPS, AAHIVP []  Legrand Como, Pharm.D., BCPS, AAHIVP []  Salome Arnt, PharmD, BCPS []  Dimitri Ped, PharmD, BCPS []  Vincenza Hews, PharmD, BCPS Alyse Low, PA-C and Despina Hidden, Pharm D  Positive urine culture  []  Patient discharged without antimicrobial prescription and treatment is now indicated [x]  Organism is resistant to prescribed ED discharge antimicrobial []  Patient with positive blood cultures   Unable to contact patient after 3 attempts, letter will be sent to address on file  Ardeen Fillers 05/18/2017, 12:39 PM

## 2017-05-18 NOTE — Telephone Encounter (Signed)
Bactrim DS 1 tab PO BID called to [place], [place]

## 2017-05-18 NOTE — Progress Notes (Addendum)
ED Antimicrobial Stewardship Positive Culture Follow Up   Tracy Johns is an 42 y.o. female who presented to [facility] on 05/15/2017 with a chief complaint of  Chief Complaint  Patient presents with  . Flank Pain    Recent Results (from the past 720 hour(s))  Urine Culture     Status: Abnormal   Collection Time: 05/15/17  3:56 AM  Result Value Ref Range Status   Specimen Description URINE, RANDOM  Final   Special Requests NONE  Final   Culture >=100,000 COLONIES/mL ENTEROBACTER AEROGENES (A)  Final   Report Status 05/17/2017 FINAL  Final   Organism ID, Bacteria ENTEROBACTER AEROGENES (A)  Final      Susceptibility   Enterobacter aerogenes - MIC*    CEFAZOLIN RESISTANT Resistant     CEFTRIAXONE <=1 SENSITIVE Sensitive     CIPROFLOXACIN <=0.25 SENSITIVE Sensitive     GENTAMICIN <=1 SENSITIVE Sensitive     IMIPENEM <=0.25 SENSITIVE Sensitive     NITROFURANTOIN 64 INTERMEDIATE Intermediate     TRIMETH/SULFA <=20 SENSITIVE Sensitive     PIP/TAZO <=4 SENSITIVE Sensitive     * >=100,000 COLONIES/mL ENTEROBACTER AEROGENES    [x]  Treated with keflex, organism resistant to prescribed antimicrobial []  Patient discharged originally without antimicrobial agent and treatment is now indicated  New antibiotic prescription: bactrim 1 DS tab BID x 7 days, discontinue keflex   ED Provider:  Alyse Low, PA-C    Jalene Mullet, Pharm.D. PGY1 Pharmacy Resident 05/18/2017 11:40 AM Main Pharmacy: [phone] Phone# [phone]

## 2017-06-23 ENCOUNTER — Other Ambulatory Visit: Payer: Self-pay

## 2017-06-23 ENCOUNTER — Emergency Department (HOSPITAL_COMMUNITY)
Admission: EM | Admit: 2017-06-23 | Discharge: 2017-06-23 | Discharge status: Against Medical Advice | Payer: Managed Care, Other (non HMO) | Source: Home / Self Care

## 2017-06-23 ENCOUNTER — Encounter (HOSPITAL_COMMUNITY): Payer: Self-pay | Admitting: Emergency Medicine

## 2017-06-23 DIAGNOSIS — Z5321 Procedure and treatment not carried out due to patient leaving prior to being seen by health care provider: Secondary | ICD-10-CM

## 2017-06-23 DIAGNOSIS — R109 Unspecified abdominal pain: Secondary | ICD-10-CM | POA: Insufficient documentation

## 2017-06-23 LAB — COMPREHENSIVE METABOLIC PANEL
ALK PHOS: 86 U/L (ref 38–126)
ALT: 21 U/L (ref 14–54)
AST: 20 U/L (ref 15–41)
Albumin: 4.1 g/dL (ref 3.5–5.0)
Anion gap: 11 (ref 5–15)
BUN: 10 mg/dL (ref 6–20)
CHLORIDE: 103 mmol/L (ref 101–111)
CO2: 24 mmol/L (ref 22–32)
CREATININE: 0.93 mg/dL (ref 0.44–1.00)
Calcium: 9.3 mg/dL (ref 8.9–10.3)
GFR calc Af Amer: >60 mL/min (ref >60)
GFR calc non Af Amer: >60 mL/min (ref >60)
Glucose, Bld: 124 mg/dL — ABNORMAL HIGH (ref 65–99)
Potassium: 3.4 mmol/L — ABNORMAL LOW (ref 3.5–5.1)
SODIUM: 138 mmol/L (ref 135–145)
Total Bilirubin: 0.6 mg/dL (ref 0.3–1.2)
Total Protein: 7.3 g/dL (ref 6.5–8.1)

## 2017-06-23 LAB — URINALYSIS, ROUTINE W REFLEX MICROSCOPIC
BILIRUBIN URINE: NEGATIVE
Glucose, UA: NEGATIVE mg/dL
Ketones, ur: NEGATIVE mg/dL
Nitrite: NEGATIVE
Protein, ur: 100 mg/dL — AB
SPECIFIC GRAVITY, URINE: 1.013 (ref 1.005–1.030)
pH: 6 (ref 5.0–8.0)

## 2017-06-23 LAB — CBC
HCT: 36.3 % (ref 36.0–46.0)
Hemoglobin: 12 g/dL (ref 12.0–15.0)
MCH: 31.3 pg (ref 26.0–34.0)
MCHC: 33.1 g/dL (ref 30.0–36.0)
MCV: 94.5 fL (ref 78.0–100.0)
PLATELETS: 367 10*3/uL (ref 150–400)
RBC: 3.84 MIL/uL — ABNORMAL LOW (ref 3.87–5.11)
RDW: 12.9 % (ref 11.5–15.5)
WBC: 13 10*3/uL — ABNORMAL HIGH (ref 4.0–10.5)

## 2017-06-23 LAB — I-STAT BETA HCG BLOOD, ED (MC, WL, AP ONLY)

## 2017-06-23 LAB — LIPASE, BLOOD: LIPASE: 21 U/L (ref 11–51)

## 2017-06-23 NOTE — ED Notes (Signed)
Pt states she wants to leave and go home.

## 2017-06-23 NOTE — ED Triage Notes (Signed)
Pt to [facility] requesting update, informed of how long they have been here and longest wait

## 2017-06-23 NOTE — ED Triage Notes (Signed)
Pt states she is having 10/10 right side abd pain since 2000 not getting relief with nothing, pt states she has hx of kidney stones but she doesn't know if this can be kidney stone pain or food poison. Pt denies any urinary symptoms no fever or diarrhea.

## 2017-06-24 NOTE — ED Notes (Signed)
06/24/2017 16:38  Called for follow-up call, no answer

## 2017-06-25 ENCOUNTER — Encounter (HOSPITAL_COMMUNITY): Payer: Self-pay | Admitting: Emergency Medicine

## 2017-06-25 ENCOUNTER — Emergency Department (HOSPITAL_COMMUNITY): Payer: Managed Care, Other (non HMO)

## 2017-06-25 ENCOUNTER — Other Ambulatory Visit: Payer: Self-pay

## 2017-06-25 DIAGNOSIS — Z6835 Body mass index (BMI) 35.0-35.9, adult: Secondary | ICD-10-CM

## 2017-06-25 DIAGNOSIS — A419 Sepsis, unspecified organism: Principal | ICD-10-CM | POA: Diagnosis present

## 2017-06-25 DIAGNOSIS — E871 Hypo-osmolality and hyponatremia: Secondary | ICD-10-CM | POA: Diagnosis present

## 2017-06-25 DIAGNOSIS — D649 Anemia, unspecified: Secondary | ICD-10-CM | POA: Diagnosis present

## 2017-06-25 DIAGNOSIS — N136 Pyonephrosis: Secondary | ICD-10-CM | POA: Diagnosis present

## 2017-06-25 DIAGNOSIS — N201 Calculus of ureter: Secondary | ICD-10-CM | POA: Diagnosis not present

## 2017-06-25 DIAGNOSIS — R6520 Severe sepsis without septic shock: Secondary | ICD-10-CM | POA: Diagnosis present

## 2017-06-25 DIAGNOSIS — F17210 Nicotine dependence, cigarettes, uncomplicated: Secondary | ICD-10-CM | POA: Diagnosis present

## 2017-06-25 DIAGNOSIS — G43909 Migraine, unspecified, not intractable, without status migrainosus: Secondary | ICD-10-CM | POA: Diagnosis present

## 2017-06-25 DIAGNOSIS — N3289 Other specified disorders of bladder: Secondary | ICD-10-CM | POA: Diagnosis present

## 2017-06-25 DIAGNOSIS — I10 Essential (primary) hypertension: Secondary | ICD-10-CM | POA: Diagnosis present

## 2017-06-25 DIAGNOSIS — Z888 Allergy status to other drugs, medicaments and biological substances status: Secondary | ICD-10-CM

## 2017-06-25 DIAGNOSIS — Z885 Allergy status to narcotic agent status: Secondary | ICD-10-CM

## 2017-06-25 DIAGNOSIS — E669 Obesity, unspecified: Secondary | ICD-10-CM | POA: Diagnosis present

## 2017-06-25 DIAGNOSIS — Z79899 Other long term (current) drug therapy: Secondary | ICD-10-CM

## 2017-06-25 DIAGNOSIS — E876 Hypokalemia: Secondary | ICD-10-CM | POA: Diagnosis present

## 2017-06-25 DIAGNOSIS — Z8669 Personal history of other diseases of the nervous system and sense organs: Secondary | ICD-10-CM

## 2017-06-25 LAB — CBC WITH DIFFERENTIAL/PLATELET
BASOS ABS: 0 10*3/uL (ref 0.0–0.1)
BASOS PCT: 0 %
EOS ABS: 0 10*3/uL (ref 0.0–0.7)
EOS PCT: 0 %
HCT: 33.6 % — ABNORMAL LOW (ref 36.0–46.0)
HEMOGLOBIN: 11.3 g/dL — AB (ref 12.0–15.0)
Lymphocytes Relative: 12 %
Lymphs Abs: 2.2 10*3/uL (ref 0.7–4.0)
MCH: 31.3 pg (ref 26.0–34.0)
MCHC: 33.6 g/dL (ref 30.0–36.0)
MCV: 93.1 fL (ref 78.0–100.0)
Monocytes Absolute: 1.7 10*3/uL — ABNORMAL HIGH (ref 0.1–1.0)
Monocytes Relative: 10 %
NEUTROS PCT: 78 %
Neutro Abs: 14.1 10*3/uL — ABNORMAL HIGH (ref 1.7–7.7)
PLATELETS: 301 10*3/uL (ref 150–400)
RBC: 3.61 MIL/uL — ABNORMAL LOW (ref 3.87–5.11)
RDW: 13.2 % (ref 11.5–15.5)
WBC: 18.1 10*3/uL — AB (ref 4.0–10.5)

## 2017-06-25 LAB — COMPREHENSIVE METABOLIC PANEL
ALT: 43 U/L (ref 14–54)
AST: 33 U/L (ref 15–41)
Albumin: 4.1 g/dL (ref 3.5–5.0)
Alkaline Phosphatase: 84 U/L (ref 38–126)
Anion gap: 13 (ref 5–15)
BILIRUBIN TOTAL: 2.3 mg/dL — AB (ref 0.3–1.2)
BUN: 10 mg/dL (ref 6–20)
CALCIUM: 8.9 mg/dL (ref 8.9–10.3)
CHLORIDE: 100 mmol/L — AB (ref 101–111)
CO2: 20 mmol/L — ABNORMAL LOW (ref 22–32)
Creatinine, Ser: 1.04 mg/dL — ABNORMAL HIGH (ref 0.44–1.00)
Glucose, Bld: 94 mg/dL (ref 65–99)
Potassium: 3.3 mmol/L — ABNORMAL LOW (ref 3.5–5.1)
Sodium: 133 mmol/L — ABNORMAL LOW (ref 135–145)
TOTAL PROTEIN: 7.8 g/dL (ref 6.5–8.1)

## 2017-06-25 LAB — I-STAT CG4 LACTIC ACID, ED: LACTIC ACID, VENOUS: 0.56 mmol/L (ref 0.5–1.9)

## 2017-06-25 LAB — I-STAT BETA HCG BLOOD, ED (MC, WL, AP ONLY)

## 2017-06-25 MED ORDER — ACETAMINOPHEN 325 MG PO TABS
650.0000 mg | ORAL_TABLET | Freq: Once | ORAL | Status: AC | PRN
  Administered 2017-06-25: 650 mg via ORAL
  Filled 2017-06-25: qty 2

## 2017-06-25 NOTE — ED Triage Notes (Signed)
Pt states she has not felt well all week  States she went to [facility] on Monday but did not stay due to the wait time Yesterday started having chills, pain in her neck and behind her left eye, fells dehydrated, had vomiting earlier in the week but today has only had dry heaves, and feels weak all over

## 2017-06-26 ENCOUNTER — Inpatient Hospital Stay (HOSPITAL_COMMUNITY): Payer: Managed Care, Other (non HMO) | Admitting: Registered Nurse

## 2017-06-26 ENCOUNTER — Emergency Department (HOSPITAL_COMMUNITY): Payer: Managed Care, Other (non HMO)

## 2017-06-26 ENCOUNTER — Encounter (HOSPITAL_COMMUNITY)
Admission: EM | Disposition: A | Discharge status: Home / Self Care | Payer: Self-pay | Source: Home / Self Care | Attending: Internal Medicine

## 2017-06-26 ENCOUNTER — Encounter (HOSPITAL_COMMUNITY): Payer: Self-pay | Admitting: Registered Nurse

## 2017-06-26 ENCOUNTER — Inpatient Hospital Stay (HOSPITAL_COMMUNITY)
Admission: EM | Admit: 2017-06-26 | Discharge: 2017-06-29 | DRG: 854 | Disposition: A | Discharge status: Home / Self Care | Payer: Managed Care, Other (non HMO) | Attending: Internal Medicine | Admitting: Internal Medicine

## 2017-06-26 ENCOUNTER — Inpatient Hospital Stay (HOSPITAL_COMMUNITY): Payer: Managed Care, Other (non HMO)

## 2017-06-26 DIAGNOSIS — N12 Tubulo-interstitial nephritis, not specified as acute or chronic: Secondary | ICD-10-CM

## 2017-06-26 DIAGNOSIS — F17210 Nicotine dependence, cigarettes, uncomplicated: Secondary | ICD-10-CM | POA: Diagnosis present

## 2017-06-26 DIAGNOSIS — Z6835 Body mass index (BMI) 35.0-35.9, adult: Secondary | ICD-10-CM | POA: Diagnosis not present

## 2017-06-26 DIAGNOSIS — Z8669 Personal history of other diseases of the nervous system and sense organs: Secondary | ICD-10-CM | POA: Diagnosis not present

## 2017-06-26 DIAGNOSIS — E669 Obesity, unspecified: Secondary | ICD-10-CM | POA: Diagnosis present

## 2017-06-26 DIAGNOSIS — E871 Hypo-osmolality and hyponatremia: Secondary | ICD-10-CM | POA: Diagnosis not present

## 2017-06-26 DIAGNOSIS — I10 Essential (primary) hypertension: Secondary | ICD-10-CM | POA: Diagnosis not present

## 2017-06-26 DIAGNOSIS — R6520 Severe sepsis without septic shock: Secondary | ICD-10-CM | POA: Diagnosis not present

## 2017-06-26 DIAGNOSIS — Z885 Allergy status to narcotic agent status: Secondary | ICD-10-CM | POA: Diagnosis not present

## 2017-06-26 DIAGNOSIS — N201 Calculus of ureter: Secondary | ICD-10-CM | POA: Diagnosis present

## 2017-06-26 DIAGNOSIS — N3289 Other specified disorders of bladder: Secondary | ICD-10-CM | POA: Diagnosis present

## 2017-06-26 DIAGNOSIS — Z79899 Other long term (current) drug therapy: Secondary | ICD-10-CM | POA: Diagnosis not present

## 2017-06-26 DIAGNOSIS — N200 Calculus of kidney: Secondary | ICD-10-CM

## 2017-06-26 DIAGNOSIS — A419 Sepsis, unspecified organism: Secondary | ICD-10-CM

## 2017-06-26 DIAGNOSIS — Z888 Allergy status to other drugs, medicaments and biological substances status: Secondary | ICD-10-CM | POA: Diagnosis not present

## 2017-06-26 DIAGNOSIS — N136 Pyonephrosis: Secondary | ICD-10-CM | POA: Diagnosis present

## 2017-06-26 DIAGNOSIS — E876 Hypokalemia: Secondary | ICD-10-CM

## 2017-06-26 DIAGNOSIS — Z87440 Personal history of urinary (tract) infections: Secondary | ICD-10-CM

## 2017-06-26 DIAGNOSIS — D649 Anemia, unspecified: Secondary | ICD-10-CM | POA: Diagnosis present

## 2017-06-26 DIAGNOSIS — Z87448 Personal history of other diseases of urinary system: Secondary | ICD-10-CM

## 2017-06-26 DIAGNOSIS — G43909 Migraine, unspecified, not intractable, without status migrainosus: Secondary | ICD-10-CM | POA: Diagnosis present

## 2017-06-26 HISTORY — DX: Personal history of other diseases of urinary system: Z87.448

## 2017-06-26 HISTORY — DX: Personal history of urinary (tract) infections: Z87.440

## 2017-06-26 HISTORY — DX: Tubulo-interstitial nephritis, not specified as acute or chronic: N12

## 2017-06-26 HISTORY — PX: CYSTOSCOPY/RETROGRADE/URETEROSCOPY: SHX5316

## 2017-06-26 LAB — URINALYSIS, ROUTINE W REFLEX MICROSCOPIC
Bilirubin Urine: NEGATIVE
Glucose, UA: NEGATIVE mg/dL
Ketones, ur: 80 mg/dL — AB
Nitrite: NEGATIVE
PH: 6 (ref 5.0–8.0)
Protein, ur: 100 mg/dL — AB
SPECIFIC GRAVITY, URINE: 1.013 (ref 1.005–1.030)

## 2017-06-26 SURGERY — CYSTOSCOPY/RETROGRADE/URETEROSCOPY
Anesthesia: General | Site: Ureter | Laterality: Right

## 2017-06-26 MED ORDER — STERILE WATER FOR IRRIGATION IR SOLN
Status: DC | PRN
  Administered 2017-06-26: 500 mL

## 2017-06-26 MED ORDER — SUGAMMADEX SODIUM 500 MG/5ML IV SOLN
INTRAVENOUS | Status: DC | PRN
  Administered 2017-06-26: 400 mg via INTRAVENOUS

## 2017-06-26 MED ORDER — ONDANSETRON HCL 4 MG PO TABS: 4.0000 mg | ORAL_TABLET | Freq: Four times a day (QID) | ORAL | Status: DC | PRN

## 2017-06-26 MED ORDER — TAMSULOSIN HCL 0.4 MG PO CAPS
0.4000 mg | ORAL_CAPSULE | Freq: Every day | ORAL | Status: DC
  Administered 2017-06-27 – 2017-06-29 (×3): 0.4 mg via ORAL
  Filled 2017-06-26 (×4): qty 1

## 2017-06-26 MED ORDER — FENTANYL CITRATE (PF) 100 MCG/2ML IJ SOLN
25.0000 ug | INTRAMUSCULAR | Status: DC | PRN
  Administered 2017-06-27 (×2): 25 ug via INTRAVENOUS
  Filled 2017-06-26 (×2): qty 2

## 2017-06-26 MED ORDER — SODIUM CHLORIDE 0.9% FLUSH
3.0000 mL | Freq: Two times a day (BID) | INTRAVENOUS | Status: DC
  Administered 2017-06-27 – 2017-06-28 (×4): 3 mL via INTRAVENOUS

## 2017-06-26 MED ORDER — LIDOCAINE 2% (20 MG/ML) 5 ML SYRINGE
INTRAMUSCULAR | Status: AC
  Filled 2017-06-26: qty 5

## 2017-06-26 MED ORDER — DEXAMETHASONE SODIUM PHOSPHATE 10 MG/ML IJ SOLN
INTRAMUSCULAR | Status: DC | PRN
  Administered 2017-06-26: 10 mg via INTRAVENOUS

## 2017-06-26 MED ORDER — PROPOFOL 10 MG/ML IV BOLUS
INTRAVENOUS | Status: DC | PRN
  Administered 2017-06-26 (×3): 100 mg via INTRAVENOUS

## 2017-06-26 MED ORDER — SODIUM CHLORIDE 0.9 % IV SOLN: 1000.0000 mL | INTRAVENOUS | Status: DC

## 2017-06-26 MED ORDER — ONDANSETRON HCL 4 MG/2ML IJ SOLN
INTRAMUSCULAR | Status: AC
  Filled 2017-06-26: qty 2

## 2017-06-26 MED ORDER — HYDROMORPHONE HCL 1 MG/ML IJ SOLN: 0.2500 mg | INTRAMUSCULAR | Status: DC | PRN

## 2017-06-26 MED ORDER — ALBUTEROL SULFATE HFA 108 (90 BASE) MCG/ACT IN AERS
INHALATION_SPRAY | RESPIRATORY_TRACT | Status: DC | PRN
  Administered 2017-06-26 (×2): 2 via RESPIRATORY_TRACT

## 2017-06-26 MED ORDER — PHENYLEPHRINE 40 MCG/ML (10ML) SYRINGE FOR IV PUSH (FOR BLOOD PRESSURE SUPPORT)
PREFILLED_SYRINGE | INTRAVENOUS | Status: AC
  Filled 2017-06-26: qty 10

## 2017-06-26 MED ORDER — ROCURONIUM BROMIDE 10 MG/ML (PF) SYRINGE
PREFILLED_SYRINGE | INTRAVENOUS | Status: DC | PRN
  Administered 2017-06-26: 5 mg via INTRAVENOUS
  Administered 2017-06-26: 30 mg via INTRAVENOUS
  Administered 2017-06-26: 20 mg via INTRAVENOUS

## 2017-06-26 MED ORDER — ONDANSETRON HCL 4 MG/2ML IJ SOLN: 4.0000 mg | Freq: Four times a day (QID) | INTRAMUSCULAR | Status: DC | PRN

## 2017-06-26 MED ORDER — DEXAMETHASONE SODIUM PHOSPHATE 10 MG/ML IJ SOLN
INTRAMUSCULAR | Status: AC
  Filled 2017-06-26: qty 1

## 2017-06-26 MED ORDER — SUGAMMADEX SODIUM 500 MG/5ML IV SOLN
INTRAVENOUS | Status: AC
  Filled 2017-06-26: qty 5

## 2017-06-26 MED ORDER — CIPROFLOXACIN IN D5W 400 MG/200ML IV SOLN
400.0000 mg | Freq: Once | INTRAVENOUS | Status: AC
  Administered 2017-06-26: 400 mg via INTRAVENOUS

## 2017-06-26 MED ORDER — FENTANYL CITRATE (PF) 250 MCG/5ML IJ SOLN
INTRAMUSCULAR | Status: AC
  Filled 2017-06-26: qty 5

## 2017-06-26 MED ORDER — LISINOPRIL 10 MG PO TABS
10.0000 mg | ORAL_TABLET | Freq: Every day | ORAL | Status: DC
  Administered 2017-06-26 – 2017-06-29 (×4): 10 mg via ORAL
  Filled 2017-06-26 (×4): qty 1

## 2017-06-26 MED ORDER — CABERGOLINE 0.5 MG PO TABS: 0.2500 mg | ORAL_TABLET | ORAL | Status: DC

## 2017-06-26 MED ORDER — ALBUTEROL SULFATE HFA 108 (90 BASE) MCG/ACT IN AERS
INHALATION_SPRAY | RESPIRATORY_TRACT | Status: AC
  Filled 2017-06-26: qty 6.7

## 2017-06-26 MED ORDER — POTASSIUM CHLORIDE IN NACL 20-0.9 MEQ/L-% IV SOLN
INTRAVENOUS | Status: AC
  Filled 2017-06-26: qty 1000

## 2017-06-26 MED ORDER — PROMETHAZINE HCL 25 MG/ML IJ SOLN: 6.2500 mg | INTRAMUSCULAR | Status: DC | PRN

## 2017-06-26 MED ORDER — POTASSIUM CHLORIDE IN NACL 20-0.9 MEQ/L-% IV SOLN
INTRAVENOUS | Status: AC
  Administered 2017-06-26 – 2017-06-27 (×3): via INTRAVENOUS
  Filled 2017-06-26 (×2): qty 1000

## 2017-06-26 MED ORDER — ALBUTEROL SULFATE (2.5 MG/3ML) 0.083% IN NEBU: 3.0000 mL | INHALATION_SOLUTION | Freq: Four times a day (QID) | RESPIRATORY_TRACT | Status: DC | PRN

## 2017-06-26 MED ORDER — ONDANSETRON HCL 4 MG/2ML IJ SOLN
4.0000 mg | Freq: Once | INTRAMUSCULAR | Status: AC
  Administered 2017-06-26: 4 mg via INTRAVENOUS
  Filled 2017-06-26: qty 2

## 2017-06-26 MED ORDER — LACTATED RINGERS IV SOLN: INTRAVENOUS | Status: DC

## 2017-06-26 MED ORDER — LIDOCAINE 2% (20 MG/ML) 5 ML SYRINGE
INTRAMUSCULAR | Status: DC | PRN
  Administered 2017-06-26: 80 mg via INTRAVENOUS

## 2017-06-26 MED ORDER — PROPOFOL 10 MG/ML IV BOLUS
INTRAVENOUS | Status: AC
  Filled 2017-06-26: qty 20

## 2017-06-26 MED ORDER — SODIUM CHLORIDE 0.9 % IR SOLN
Status: DC | PRN
  Administered 2017-06-26: 3000 mL

## 2017-06-26 MED ORDER — MIDAZOLAM HCL 2 MG/2ML IJ SOLN
INTRAMUSCULAR | Status: AC
  Filled 2017-06-26: qty 2

## 2017-06-26 MED ORDER — LISINOPRIL 10 MG PO TABS: 10.0000 mg | ORAL_TABLET | Freq: Every day | ORAL | Status: DC

## 2017-06-26 MED ORDER — ENOXAPARIN SODIUM 40 MG/0.4ML ~~LOC~~ SOLN
40.0000 mg | SUBCUTANEOUS | Status: DC
  Administered 2017-06-27 – 2017-06-28 (×2): 40 mg via SUBCUTANEOUS
  Filled 2017-06-26 (×2): qty 0.4

## 2017-06-26 MED ORDER — ONDANSETRON HCL 4 MG/2ML IJ SOLN
INTRAMUSCULAR | Status: DC | PRN
  Administered 2017-06-26: 4 mg via INTRAVENOUS

## 2017-06-26 MED ORDER — SODIUM CHLORIDE 0.9 % IV SOLN
1.0000 g | Freq: Once | INTRAVENOUS | Status: AC
  Administered 2017-06-26: 1 g via INTRAVENOUS
  Filled 2017-06-26: qty 10

## 2017-06-26 MED ORDER — LACTATED RINGERS IV SOLN
INTRAVENOUS | Status: DC | PRN
  Administered 2017-06-26: 09:00:00 via INTRAVENOUS

## 2017-06-26 MED ORDER — SODIUM CHLORIDE 0.9 % IV BOLUS (SEPSIS)
1000.0000 mL | Freq: Once | INTRAVENOUS | Status: AC
  Administered 2017-06-26: 1000 mL via INTRAVENOUS

## 2017-06-26 MED ORDER — SUCCINYLCHOLINE CHLORIDE 200 MG/10ML IV SOSY
PREFILLED_SYRINGE | INTRAVENOUS | Status: DC | PRN
  Administered 2017-06-26: 100 mg via INTRAVENOUS

## 2017-06-26 MED ORDER — IBUPROFEN 200 MG PO TABS
600.0000 mg | ORAL_TABLET | Freq: Once | ORAL | Status: AC
  Administered 2017-06-26: 600 mg via ORAL
  Filled 2017-06-26: qty 3

## 2017-06-26 MED ORDER — PHENYLEPHRINE 40 MCG/ML (10ML) SYRINGE FOR IV PUSH (FOR BLOOD PRESSURE SUPPORT)
PREFILLED_SYRINGE | INTRAVENOUS | Status: DC | PRN
  Administered 2017-06-26 (×3): 40 ug via INTRAVENOUS

## 2017-06-26 MED ORDER — FENTANYL CITRATE (PF) 250 MCG/5ML IJ SOLN
INTRAMUSCULAR | Status: DC | PRN
  Administered 2017-06-26: 50 ug via INTRAVENOUS
  Administered 2017-06-26 (×2): 100 ug via INTRAVENOUS

## 2017-06-26 MED ORDER — MIDAZOLAM HCL 5 MG/5ML IJ SOLN
INTRAMUSCULAR | Status: DC | PRN
  Administered 2017-06-26: 2 mg via INTRAVENOUS

## 2017-06-26 MED ORDER — SODIUM CHLORIDE 0.9 % IV SOLN
1.0000 g | INTRAVENOUS | Status: DC
  Administered 2017-06-27 – 2017-06-29 (×3): 1 g via INTRAVENOUS
  Filled 2017-06-26 (×3): qty 1

## 2017-06-26 MED ORDER — CIPROFLOXACIN IN D5W 400 MG/200ML IV SOLN
INTRAVENOUS | Status: AC
  Filled 2017-06-26: qty 200

## 2017-06-26 MED ORDER — DIATRIZOATE MEGLUMINE 30 % UR SOLN
URETHRAL | Status: DC | PRN
  Administered 2017-06-26: 30 mL

## 2017-06-26 SURGICAL SUPPLY — 23 items
BAG URO CATCHER STRL LF (MISCELLANEOUS) ×3 IMPLANT
BASKET ZERO TIP NITINOL 2.4FR (BASKET) IMPLANT
CATH INTERMIT  6FR 70CM (CATHETERS) ×3 IMPLANT
CLOTH BEACON ORANGE TIMEOUT ST (SAFETY) ×3 IMPLANT
COVER FOOTSWITCH UNIV (MISCELLANEOUS) IMPLANT
COVER SURGICAL LIGHT HANDLE (MISCELLANEOUS) ×3 IMPLANT
GLOVE BIO SURGEON STRL SZ 6.5 (GLOVE) ×2 IMPLANT
GLOVE BIO SURGEONS STRL SZ 6.5 (GLOVE) ×1
GLOVE BIOGEL M STRL SZ7.5 (GLOVE) ×3 IMPLANT
GLOVE BIOGEL PI IND STRL 6.5 (GLOVE) ×1 IMPLANT
GLOVE BIOGEL PI INDICATOR 6.5 (GLOVE) ×2
GOWN STRL REUS W/TWL LRG LVL3 (GOWN DISPOSABLE) ×6 IMPLANT
GOWN STRL REUS W/TWL XL LVL3 (GOWN DISPOSABLE) ×3 IMPLANT
GUIDEWIRE ANG ZIPWIRE 038X150 (WIRE) IMPLANT
GUIDEWIRE STR DUAL SENSOR (WIRE) ×3 IMPLANT
MANIFOLD NEPTUNE II (INSTRUMENTS) ×3 IMPLANT
PACK CYSTO (CUSTOM PROCEDURE TRAY) ×3 IMPLANT
STENT URET 6FRX26 CONTOUR (STENTS) ×3 IMPLANT
TRAY FOLEY CATH 16FR SILVER (SET/KITS/TRAYS/PACK) ×3 IMPLANT
TUBING CONNECTING 10 (TUBING) ×2 IMPLANT
TUBING CONNECTING 10' (TUBING) ×1
TUBING UROLOGY SET (TUBING) ×3 IMPLANT
WATER STERILE IRR 500ML POUR (IV SOLUTION) ×3 IMPLANT

## 2017-06-26 NOTE — ED Notes (Addendum)
Patient was not informed by the surgeon. Awaiting his arrival. This RN is not comfortable with obtaining signature for consent.

## 2017-06-26 NOTE — Plan of Care (Signed)
  Progressing Urinary Elimination: Signs and symptoms of infection will decrease 06/26/2017 2223 - Progressing by Talbert Forest, RN Fluid Volume: Hemodynamic stability will improve 06/26/2017 2223 - Progressing by Talbert Forest, RN Clinical Measurements: Diagnostic test results will improve 06/26/2017 2223 - Progressing by Talbert Forest, RN Signs and symptoms of infection will decrease 06/26/2017 2223 - Progressing by Talbert Forest, RN Respiratory: Ability to maintain adequate ventilation will improve 06/26/2017 2223 - Progressing by Talbert Forest, RN Education: Knowledge of General Education information will improve 06/26/2017 2223 - Progressing by Talbert Forest, RN Health Behavior/Discharge Planning: Ability to manage health-related needs will improve 06/26/2017 2223 - Progressing by Talbert Forest, RN Clinical Measurements: Ability to maintain clinical measurements within normal limits will improve 06/26/2017 2223 - Progressing by Talbert Forest, RN Will remain free from infection 06/26/2017 2223 - Progressing by Talbert Forest, RN Diagnostic test results will improve 06/26/2017 2223 - Progressing by Talbert Forest, RN Respiratory complications will improve 06/26/2017 2223 - Progressing by Talbert Forest, RN Cardiovascular complication will be avoided 06/26/2017 2223 - Progressing by Talbert Forest, RN Activity: Risk for activity intolerance will decrease 06/26/2017 2223 - Progressing by Talbert Forest, RN Nutrition: Adequate nutrition will be maintained 06/26/2017 2223 - Progressing by Talbert Forest, RN Coping: Level of anxiety will decrease 06/26/2017 2223 - Progressing by Talbert Forest, RN Elimination: Will not experience complications related to urinary retention 06/26/2017 2223 - Progressing by Talbert Forest, RN Pain Managment: General experience of comfort will  improve 06/26/2017 2223 - Progressing by Talbert Forest, RN Safety: Ability to remain free from injury will improve 06/26/2017 2223 - Progressing by Talbert Forest, RN Skin Integrity: Risk for impaired skin integrity will decrease 06/26/2017 2223 - Progressing by Talbert Forest, RN

## 2017-06-26 NOTE — Consult Note (Addendum)
Consult: Sepsis, UTI, right ureteral stones, hydronephrosis Requested by: Tegeler, Gwenyth Allegra, MD   History of Present Illness: 42 year old female who presented to emergency last night with fevers, chills, nausea, vomiting and right flank pain.  Her UA showed many bacteria, too numerous to count red cells and too numerous to count white cells.  Her white count was 18.  CT scan of the abdomen and pelvis revealed right hydronephrosis to the right proximal ureteral stone of 11 mm.  She also had a 6 mm stone at the right ureterovesical junction, possibly at the ureteral orifice.  She is added for urgent stent.  She's passed stones since 2008 but never needed surgery. In fact, she went to the ED Dec 2018 and the 6 mm stone was in the right proximal ureter, the 11 mm stone in the RLP. She also went to ED Jan 2018 when a KUB showed the renal stone but not the ureteral stone. She did not f/u with GU.   Past Medical History:  Diagnosis Date  . Hypercholesterolemia   . Hypertension   . Migraines   . Seizures ([place])    r/t biopsy   Past Surgical History:  Procedure Laterality Date  . CESAREAN SECTION    . TUBAL LIGATION      Home Medications:   (Not in a hospital admission) Allergies:  Allergies  Allergen Reactions  . Imitrex [Sumatriptan] Nausea And Vomiting  . Percocet [Oxycodone-Acetaminophen] Itching and Other (See Comments)    Body feeling like it's "burning," also     Family History  Problem Relation Age of Onset  . Hypertension Other   . Stroke Other   . Heart failure Other    Social History:  reports that she has been smoking cigarettes.  She has been smoking about 0.25 packs per day. she has never used smokeless tobacco. She reports that she drinks alcohol. She reports that she does not use drugs.  ROS: A complete review of systems was performed.  All systems are negative except for pertinent findings as noted. Review of Systems  Constitutional: Positive for chills.   Gastrointestinal: Positive for nausea.  Musculoskeletal: Positive for back pain.  Neurological: Positive for weakness.     Physical Exam:  Vital signs in last 24 hours: Temp:  [100 F (37.8 C)-103.1 F (39.5 C)] 100.6 F (38.1 C) (02/15 0436) Pulse Rate:  [75-119] 94 (02/15 0600) Resp:  [18-20] 20 (02/15 0600) BP: (126-156)/(79-100) 128/79 (02/15 0600) SpO2:  [91 %-99 %] 99 % (02/15 0600) Weight:  [84.8 kg (187 lb)] 84.8 kg (187 lb) (02/14 1959) General:  Alert and oriented, No acute distress HEENT: Normocephalic, atraumatic Cardiovascular: Regular rate and rhythm Lungs: Regular rate and effort Abdomen: Soft, nontender, nondistended, no abdominal masses Extremities: No edema Neurologic: Grossly intact  Laboratory Data:  Results for orders placed or performed during the hospital encounter of 06/26/17 (from the past 24 hour(s))  Comprehensive metabolic panel     Status: Abnormal   Collection Time: 06/25/17  8:06 PM  Result Value Ref Range   Sodium 133 (L) 135 - 145 mmol/L   Potassium 3.3 (L) 3.5 - 5.1 mmol/L   Chloride 100 (L) 101 - 111 mmol/L   CO2 20 (L) 22 - 32 mmol/L   Glucose, Bld 94 65 - 99 mg/dL   BUN 10 6 - 20 mg/dL   Creatinine, Ser 1.04 (H) 0.44 - 1.00 mg/dL   Calcium 8.9 8.9 - 10.3 mg/dL   Total Protein 7.8 6.5 - 8.1  g/dL   Albumin 4.1 3.5 - 5.0 g/dL   AST 33 15 - 41 U/L   ALT 43 14 - 54 U/L   Alkaline Phosphatase 84 38 - 126 U/L   Total Bilirubin 2.3 (H) 0.3 - 1.2 mg/dL   GFR calc non Af Amer >60 >60 mL/min   GFR calc Af Amer >60 >60 mL/min   Anion gap 13 5 - 15  CBC with Differential     Status: Abnormal   Collection Time: 06/25/17  8:06 PM  Result Value Ref Range   WBC 18.1 (H) 4.0 - 10.5 K/uL   RBC 3.61 (L) 3.87 - 5.11 MIL/uL   Hemoglobin 11.3 (L) 12.0 - 15.0 g/dL   HCT 33.6 (L) 36.0 - 46.0 %   MCV 93.1 78.0 - 100.0 fL   MCH 31.3 26.0 - 34.0 pg   MCHC 33.6 30.0 - 36.0 g/dL   RDW 13.2 11.5 - 15.5 %   Platelets 301 150 - 400 K/uL   Neutrophils  Relative % 78 %   Neutro Abs 14.1 (H) 1.7 - 7.7 K/uL   Lymphocytes Relative 12 %   Lymphs Abs 2.2 0.7 - 4.0 K/uL   Monocytes Relative 10 %   Monocytes Absolute 1.7 (H) 0.1 - 1.0 K/uL   Eosinophils Relative 0 %   Eosinophils Absolute 0.0 0.0 - 0.7 K/uL   Basophils Relative 0 %   Basophils Absolute 0.0 0.0 - 0.1 K/uL  I-Stat beta hCG blood, ED     Status: None   Collection Time: 06/25/17  8:18 PM  Result Value Ref Range   I-stat hCG, quantitative <5.0 <5 mIU/mL   Comment 3          I-Stat CG4 Lactic Acid, ED     Status: None   Collection Time: 06/25/17  8:20 PM  Result Value Ref Range   Lactic Acid, Venous 0.56 0.5 - 1.9 mmol/L  Urinalysis, Routine w reflex microscopic     Status: Abnormal   Collection Time: 06/26/17  3:21 AM  Result Value Ref Range   Color, Urine YELLOW YELLOW   APPearance CLOUDY (A) CLEAR   Specific Gravity, Urine 1.013 1.005 - 1.030   pH 6.0 5.0 - 8.0   Glucose, UA NEGATIVE NEGATIVE mg/dL   Hgb urine dipstick LARGE (A) NEGATIVE   Bilirubin Urine NEGATIVE NEGATIVE   Ketones, ur 80 (A) NEGATIVE mg/dL   Protein, ur 100 (A) NEGATIVE mg/dL   Nitrite NEGATIVE NEGATIVE   Leukocytes, UA LARGE (A) NEGATIVE   RBC / HPF TOO NUMEROUS TO COUNT 0 - 5 RBC/hpf   WBC, UA TOO NUMEROUS TO COUNT 0 - 5 WBC/hpf   Bacteria, UA MANY (A) NONE SEEN   Squamous Epithelial / LPF 0-5 (A) NONE SEEN   WBC Clumps PRESENT    Mucus PRESENT    No results found for this or any previous visit (from the past 240 hour(s)). Creatinine: Recent Labs    06/23/17 0046 06/25/17 2006  CREATININE 0.93 1.04*    Impression/Assessment/plan: 1) Fever, UTI- hospitalist admit with sepsis protocol  2) Right ureteral stones, hydronephrosis-I drew the patient and her husband a picture of the CT findings. I discussed with the patient the nature, potential benefits, risks and alternatives to cystoscopy, right RGP and right ureteral stent, including side effects of the proposed treatment, the likelihood of  the patient achieving the goals of the procedure, and any potential problems that might occur during the procedure or recuperation.  We discussed the  rationale for pre-stent, stage procedure with delayed ureteroscopy once infection is treated.  We discussed alternatives such as nephrostomy and that a nephrostomy tube would be needed if failure to gain retrograde access is encountered. We discussed why ESWL is not a good option. We also discussed we may need to perform laser lithotripsy on the distal stone if it is at the ureteral orifice and blocking the wire, but again discussed why we would not approach the large proximal stone. All questions answered. Patient elects to proceed.    Festus Aloe 06/26/2017, 8:13 AM

## 2017-06-26 NOTE — Op Note (Signed)
Preoperative diagnosis: UTI, fever, right hydronephrosis, right ureteral stones Postoperative diagnosis: Same  Procedure: Cystoscopy, right retrograde pyelogram, right ureteral stent placement  Surgeon: Junious Silk  Anesthesia: General  Indication for procedure: 42 year old female with fevers, chills with CT scan revealing 11 mm right proximal stone and 7 mm right distal stone with UA consistent with UTI.  She was brought for urgent stent.  Findings: Cystoscopy revealed stone in the intramural ureter left blocking the ureteral orifice, otherwise unremarkable.  No stone or foreign body in the bladder.  No mucosal lesions.  Right retrograde pyelogram-this outlined a filling defect in the intramural ureter as well as a filling defect in the proximal ureter consistent with the stones.  Mild dilation of the collecting system noted.  Description of procedure: After consent was obtained the patient brought to the operating room.  After adequate anesthesia she was placed in lithotomy position and prepped and draped in the usual sterile fashion.  A timeout was performed to confirm the patient and procedure.  The cystoscope was passed per urethra and the bladder inspected.  The bladder was drained and refilled several times.  The stone in the right intramural ureter was somewhat mobile and would come in and out of view at the ureteral orifice.  Given there was likely space beside it, a 6 [place] open-ended catheter was used to cannulate the right ureteral orifice.  Retrograde injection of contrast was performed.  A sensor wire was then advanced and coiled in the upper pole collecting system.  Over the wire a 6 x 26 cm stent was advanced.  The wire was removed with a good coil seen in the kidney and a good coil in the bladder.  The stent was draining well.  The scope was removed and a 16 [place] Foley was placed to max drain the system for today and overnight.  Patient was stable throughout the case but was  tachycardic into the 120s. The patient was awakened and taken to the recovery room in stable condition.   Complications: None  Blood loss: 0  Specimens: 0  Drains: 6 x 26 cm right ureteral stent  Disposition: Patient stable to PACU. Foley could be removed tomorrow.  I will set up ureteroscopy in 2-3 weeks.

## 2017-06-26 NOTE — ED Notes (Signed)
Consult to urology @ 06:41am.

## 2017-06-26 NOTE — ED Notes (Signed)
Pt was coming back from the bathroom with her husband when I walked into the room. Pt had just urinated. Will check back after fluids to see if she can provide a sample.

## 2017-06-26 NOTE — Anesthesia Postprocedure Evaluation (Signed)
Anesthesia Post Note  Patient: Tracy Johns  Procedure(s) Performed: CYSTOSCOPY RIGHT RETROGRADE RIGHT CYSTOSCOPY (Right Ureter)     Patient location during evaluation: PACU Anesthesia Type: General Level of consciousness: awake and alert Pain management: pain level controlled Vital Signs Assessment: post-procedure vital signs reviewed and stable Respiratory status: spontaneous breathing, nonlabored ventilation, respiratory function stable and patient connected to nasal cannula oxygen Cardiovascular status: blood pressure returned to baseline and stable Postop Assessment: no apparent nausea or vomiting Anesthetic complications: no    Last Vitals:  Vitals:   06/26/17 1200 06/26/17 1215  BP: 130/86   Pulse: 87 93  Resp: 16 (!) 22  Temp:    SpO2: 100% 100%    Last Pain:  Vitals:   06/26/17 1130  TempSrc:   PainSc: 0-No pain                 Viyan Rosamond P Shaconda Hajduk

## 2017-06-26 NOTE — H&P (Signed)
[facility] History and Physical  Tracy Johns PFX:[id number] DOB: 03-01-1976 DOA: 06/26/2017  Referring physician:  PCP: Clent Demark, PA-C  Specialists:   Chief Complaint: fever  HPI: Tracy Johns is a 42 y.o. female with history of hypertension, tobacco use presented with fevers, chills, night sweats associated with nausea, vomiting and back pains. She reports right sided dull back pains with mild radiation to he right groin. She felt dehydrated and not able to eat or drink for few days. She has had similar symptoms in the past and diagnosed with nephrolithiasis. She was diagnosed with kidney stones in December and was recommended to f/u with urology as outpatient,. She presented to emergency room last night due to worsening flank pains, fevers and vomiting. She denies acute chest pains, no hematemesis, no hematochezia, no focal neuro symptoms  -ED: CT scan showed obstructing stone, UTI. ED d/w urology (Dr. Junious Silk) who plans for urgent stenting. hospitalist is called for admission   Review of Systems: The patient denies anorexia, fever, weight loss,, vision loss, decreased hearing, hoarseness, chest pain, syncope, dyspnea on exertion, peripheral edema, balance deficits, hemoptysis, abdominal pain, melena, hematochezia, severe indigestion/heartburn, hematuria, incontinence, genital sores, muscle weakness, suspicious skin lesions, transient blindness, difficulty walking, depression, unusual weight change, abnormal bleeding, enlarged lymph nodes, angioedema, and breast masses.    Past Medical History:  Diagnosis Date  . Hypercholesterolemia   . Hypertension   . Migraines   . Seizures ([place])    r/t biopsy   Past Surgical History:  Procedure Laterality Date  . CESAREAN SECTION    . TUBAL LIGATION     Social History:  reports that she has been smoking cigarettes.  She has been smoking about 0.25 packs per day. she has never used smokeless tobacco. She  reports that she drinks alcohol. She reports that she does not use drugs. Home;  where does patient live--home, ALF, SNF? and with whom if at home? Yes;  Can patient participate in ADLs?  Allergies  Allergen Reactions  . Imitrex [Sumatriptan] Nausea And Vomiting  . Percocet [Oxycodone-Acetaminophen] Itching and Other (See Comments)    Body feeling like it's "burning," also     Family History  Problem Relation Age of Onset  . Hypertension Other   . Stroke Other   . Heart failure Other     (be sure to complete)  Prior to Admission medications   Medication Sig Start Date End Date Taking? Authorizing Provider  albuterol (VENTOLIN HFA) 108 (90 Base) MCG/ACT inhaler Inhale 2 puffs into the lungs every 6 (six) hours as needed for wheezing or shortness of breath.   Yes [provider]  aspirin-acetaminophen-caffeine (EXCEDRIN MIGRAINE) [phone] MG tablet Take 1-2 tablets by mouth every 6 (six) hours as needed for headache.   Yes [provider]  cabergoline (DOSTINEX) 0.5 MG tablet Take 0.5 tablets (0.25 mg total) by mouth once a week. Patient taking differently: Take 0.25 mg by mouth every Sunday.  04/06/17  Yes Philemon Kingdom, MD  lisinopril (PRINIVIL,ZESTRIL) 10 MG tablet Take 1 tablet (10 mg total) by mouth daily. 07/10/16  Yes Clent Demark, PA-C  cephALEXin (KEFLEX) 500 MG capsule Take 1 capsule (500 mg total) by mouth 2 (two) times daily. Patient not taking: Reported on 06/26/2017 05/15/17   Orpah Greek, MD  hydrochlorothiazide (HYDRODIURIL) 12.5 MG tablet Take 1 tablet (12.5 mg total) by mouth daily. Patient not taking: Reported on 06/26/2017 07/10/16   Clent Demark, PA-C  HYDROcodone-acetaminophen (NORCO/VICODIN) 5-325 MG tablet  Take 1-2 tablets by mouth every 4 (four) hours as needed for moderate pain. Patient not taking: Reported on 06/26/2017 05/15/17   Orpah Greek, MD  ibuprofen (ADVIL,MOTRIN) 800 MG tablet Take 1 tablet (800 mg total)  by mouth every 8 (eight) hours as needed for moderate pain (for headaches). Patient not taking: Reported on 06/26/2017 05/06/17   Domenic Moras, PA-C  ondansetron (ZOFRAN ODT) 4 MG disintegrating tablet Take 1 tablet (4 mg total) by mouth every 8 (eight) hours as needed for nausea or vomiting. Patient not taking: Reported on 06/26/2017 05/03/17   Horton, Barbette Hair, MD  ondansetron (ZOFRAN) 4 MG tablet Take 1 tablet (4 mg total) by mouth every 6 (six) hours. Patient not taking: Reported on 06/26/2017 05/15/17   Orpah Greek, MD  promethazine (PHENERGAN) 25 MG tablet Take 1 tablet (25 mg total) by mouth every 6 (six) hours as needed for nausea. Patient not taking: Reported on 06/26/2017 05/06/17   Domenic Moras, PA-C  tamsulosin (FLOMAX) 0.4 MG CAPS capsule Take 1 capsule (0.4 mg total) by mouth daily. Patient not taking: Reported on 06/26/2017 05/03/17   Merryl Hacker, MD   Physical Exam: Vitals:   06/26/17 0530 06/26/17 0600  BP: 139/90 128/79  Pulse: (!) 101 94  Resp: 18 20  Temp:    SpO2: 98% 99%     General:  Alert. No distress   Eyes: eom-I, perrla   ENT: no oral ulcers   Neck: supple, no JVD   Cardiovascular: s1,s2 rrr  Respiratory: CTA BL  Abdomen: soft, nt, obese   Skin: no rash   Musculoskeletal: no leg edema   Psychiatric: no hallucinations   Neurologic: CN 2-12 intact. Motor symmetric   Labs on Admission:  Basic Metabolic Panel: Recent Labs  Lab 06/23/17 0046 06/25/17 2006  NA 138 133*  K 3.4* 3.3*  CL 103 100*  CO2 24 20*  GLUCOSE 124* 94  BUN 10 10  CREATININE 0.93 1.04*  CALCIUM 9.3 8.9   Liver Function Tests: Recent Labs  Lab 06/23/17 0046 06/25/17 2006  AST 20 33  ALT 21 43  ALKPHOS 86 84  BILITOT 0.6 2.3*  PROT 7.3 7.8  ALBUMIN 4.1 4.1   Recent Labs  Lab 06/23/17 0046  LIPASE 21   No results for input(s): AMMONIA in the last 168 hours. CBC: Recent Labs  Lab 06/23/17 0046 06/25/17 2006  WBC 13.0* 18.1*  NEUTROABS   --  14.1*  HGB 12.0 11.3*  HCT 36.3 33.6*  MCV 94.5 93.1  PLT 367 301   Cardiac Enzymes: No results for input(s): CKTOTAL, CKMB, CKMBINDEX, TROPONINI in the last 168 hours.  BNP (last 3 results) No results for input(s): BNP in the last 8760 hours.  ProBNP (last 3 results) No results for input(s): PROBNP in the last 8760 hours.  CBG: No results for input(s): GLUCAP in the last 168 hours.  Radiological Exams on Admission: Dg Chest 2 View  Result Date: 06/25/2017 CLINICAL DATA:  Fever, cough and congestion for 48 hours. EXAM: CHEST  2 VIEW COMPARISON:  None. FINDINGS: The cardiac silhouette, mediastinal and hilar contours are within normal limits. The lungs are clear. No infiltrates or effusions. The bony thorax is intact. IMPRESSION: No acute cardiopulmonary findings. Electronically Signed   By: Marijo Sanes M.D.   On: 06/25/2017 20:28   Ct Renal Stone Study  Result Date: 06/26/2017 CLINICAL DATA:  Flank pain. EXAM: CT ABDOMEN AND PELVIS WITHOUT CONTRAST TECHNIQUE: Multidetector CT imaging of  the abdomen and pelvis was performed following the standard protocol without IV contrast. COMPARISON:  CT 05/03/2017 FINDINGS: Lower chest: No consolidation or pleural fluid. Minimal hypoventilatory changes in the lung bases. Hepatobiliary: Decreased hepatic density consistent with steatosis. No focal lesion allowing for lack contrast. Gallbladder physiologically distended, no calcified stone. No biliary dilatation. Pancreas: No ductal dilatation or inflammation. Spleen: Normal in size without focal abnormality. Adrenals/Urinary Tract: No adrenal nodule. Obstructing 9 x 11 mm stone in the right proximal ureter (at the level of L3-L4) with moderate proximal hydroureteronephrosis and perinephric edema. There is a 6 x 7 mm stone at the right ureterovesicular junction. No left hydronephrosis. No left urolithiasis. Urinary bladder is physiologically distended without wall thickening. Stomach/Bowel: Stomach is  nondistended. No small bowel inflammation, obstruction or wall thickening. Mild wall thickening of the ascending colon adjacent to renal inflammation likely reactive. Small to moderate colonic stool burden. Normal appendix. Vascular/Lymphatic: Mild aorta bi-iliac atherosclerosis. Retroaortic left renal vein. No bulky abdominal or pelvic adenopathy. Small calcified density in the pelvis likely calcified lymph node. Reproductive: Uterus and bilateral adnexa are unremarkable. Small amount of free fluid in the pelvis may be physiologic or reactive. Other: No free air or intra-abdominal abscess. Musculoskeletal: Mild facet arthropathy at L5-S1. There are no acute or suspicious osseous abnormalities. IMPRESSION: 1. Obstructing 9 x 11 mm stone in the right proximal ureter with moderate hydroureteronephrosis and perinephric edema. There is an additional 6 x 7 stone at the right ureterovesicular junction. 2. Hepatic steatosis. 3.  Aortic Atherosclerosis (ICD10-I70.0). Electronically Signed   By: Jeb Levering M.D.   On: 06/26/2017 06:30    EKG: Independently reviewed.   Assessment/Plan Active Problems:   Pyelonephritis   42 y.o. female with history of hypertension, tobacco use presented with fevers, chills, night sweats associated with nausea, vomiting and back pains and admitted with nephrolithiasis, hydronephrosis, urinary tract infection  Back  pain. Nephrolithiasis. Hydronephrosis. CT scan showed Obstructing 9 x 11 mm stone in the right proximal ureter with moderate hydroureteronephrosis and perinephric edema. There is an additional 6 x 7 stone at the right ureterovesicular junction. -ED d/w urology, who is planning for urgent stent placement today. Cont pain control (patient denies allergies to morphine, but reports some side effect to percocet). Cont NPO, IV fluids, cont per urology   UTI. Pyelonephritis. hemodynamically stable. Lactic acid 0.5. Cont iv antibiotic treatment with ceftriaxone, pend  cultures. Recent urine cultures +enterobacter sensitive to ceftriaxone   Hypokalemia, hyponatremia. Suspect due to gi loss/vomiting, +hctz. Replace. Monitor   HTN. BP is stable. Hold diuretic while npo. Cont lisinopril    Urology.  if consultant consulted, please document name and whether formally or informally consulted  Code Status: full (must indicate code status--if unknown or must be presumed, indicate so) Family Communication: d/w patient, ED (indicate person spoken with, if applicable, with phone number if by telephone) Disposition Plan: home when stable  (indicate anticipated LOS)  Time spent: >45 minutes   Kinnie Feil [facility] Pager [phone]  If 7PM-7AM, please contact night-coverage [email or web address] Password [facility] 06/26/2017, 7:35 AM

## 2017-06-26 NOTE — Transfer of Care (Signed)
Immediate Anesthesia Transfer of Care Note  Patient: Tracy Johns  Procedure(s) Performed: CYSTOSCOPY RIGHT RETROGRADE RIGHT CYSTOSCOPY (Right Ureter)  Patient Location: PACU  Anesthesia Type:General  Level of Consciousness: awake, alert , oriented and patient cooperative  Airway & Oxygen Therapy: Patient Spontanous Breathing and Patient connected to face mask oxygen  Post-op Assessment: Report given to RN, Post -op Vital signs reviewed and stable and Patient moving all extremities  Post vital signs: Reviewed and stable  Last Vitals:  Vitals:   06/26/17 0530 06/26/17 0600  BP: 139/90 128/79  Pulse: (!) 101 94  Resp: 18 20  Temp:    SpO2: 98% 99%    Last Pain:  Vitals:   06/26/17 0436  TempSrc: Oral  PainSc:          Complications: No apparent anesthesia complications

## 2017-06-26 NOTE — ED Provider Notes (Addendum)
[facility] Provider Note   CSN: [id number] Arrival date & time: 06/25/17  1911     History   Chief Complaint Chief Complaint  Patient presents with  . Fever  . Nausea    HPI Tracy Johns is a 42 y.o. female.  Patient presents with symptoms that started yesterday with general malaise and one episode vomiting. Today she had a fever, increasing weakness, nausea without persistent vomiting. No significant congestion or cough. She reports right back pain. She denies dysuria, hematuria or frequency. She feels dehydrated and lightheaded when she stands. No syncope.     The history is provided by the patient. No language interpreter was used.  Fever   Associated symptoms include vomiting.    Past Medical History:  Diagnosis Date  . Dyspnea on exertion   . History of acute pyelonephritis 06/26/2017   w/ sepsis  . History of kidney stones    per pt 2008 and passed spontenaously  . History of pseudoseizure    07-07-2017 per had first episode seizure activity that told to her caused by extreme stress in 2012, 2015 and last one 2017;  per pt was told no medication needed  . Hypercholesterolemia   . Hyperprolactinemia [facility] [address]) dx 2012   endocrinologist-  dr Cruzita Lederer  . Hypertension   . Migraines   . Pituitary microadenoma ([place])    dx per MRI 06/ 2018  . Wears glasses     Patient Active Problem List   Diagnosis Date Noted  . Nephrolithiasis 06/27/2017  . Hypokalemia 06/27/2017  . Hyponatremia 06/27/2017  . Essential hypertension 06/27/2017  . Sepsis ([place]) 06/27/2017  . Pyelonephritis 06/26/2017  . Empty sella ([place]) 04/06/2017  . Prolactinoma ([place]) 04/06/2017  . High serum adrenocorticotropic hormone (ACTH) 04/06/2017  . Hyperprolactinemia ([place]) 10/02/2016    Past Surgical History:  Procedure Laterality Date  . CESAREAN SECTION  x2  last one 2000   Bilateral Tubal Ligation w/ last c/s  .  CYSTOSCOPY/RETROGRADE/URETEROSCOPY Right 06/26/2017   Procedure: CYSTOSCOPY RIGHT RETROGRADE RIGHT CYSTOSCOPY;  Surgeon: Festus Aloe, MD;  Location: [facility];  Service: Urology;  Laterality: Right;  . CYSTOSCOPY/URETEROSCOPY/HOLMIUM LASER/STENT PLACEMENT Right 07/10/2017   Procedure: CYSTOSCOPY/URETEROSCOPY/HOLMIUM LASER/STENT EXCHANGE;  Surgeon: Festus Aloe, MD;  [facility];  Service: Urology;  Laterality: Right;    OB History    No data available       Home Medications    Prior to Admission medications   Medication Sig Start Date End Date Taking? Authorizing Provider  albuterol (VENTOLIN HFA) 108 (90 Base) MCG/ACT inhaler Inhale 2 puffs into the lungs every 6 (six) hours as needed for wheezing or shortness of breath.    Yes [provider]  cabergoline (DOSTINEX) 0.5 MG tablet Take 0.5 tablets (0.25 mg total) by mouth once a week. Patient taking differently: Take 0.25 mg by mouth every Sunday.  04/06/17  Yes Philemon Kingdom, MD  lisinopril (PRINIVIL,ZESTRIL) 10 MG tablet Take 1 tablet (10 mg total) by mouth daily. Patient taking differently: Take 10 mg by mouth every evening.  07/10/16  Yes Clent Demark, PA-C  acetaminophen (TYLENOL) 500 MG tablet Take 500 mg by mouth every 6 (six) hours as needed.    [provider]  aspirin-acetaminophen-caffeine (EXCEDRIN MIGRAINE) [phone] MG tablet Take by mouth every 6 (six) hours as needed for headache.    [provider]  hydrochlorothiazide (HYDRODIURIL) 12.5 MG tablet Take 1 tablet (12.5 mg total) by mouth daily. Patient taking differently:  Take 12.5 mg by mouth every evening.  07/10/16   Clent Demark, PA-C  traMADol (ULTRAM) 50 MG tablet Take 1 tablet (50 mg total) by mouth every 6 (six) hours as needed. 07/10/17 07/10/18  Festus Aloe, MD    Family History Family History  Problem Relation Age of Onset  . Hypertension Other   . Stroke Other   . Heart failure Other      Social History Social History   Tobacco Use  . Smoking status: Current Every Day Smoker    Packs/day: 0.50    Years: 6.00    Pack years: 3.00    Types: Cigarettes  . Smokeless tobacco: Never Used  Substance Use Topics  . Alcohol use: Yes    Comment: hasnt drank since christmas  . Drug use: No     Allergies   Imitrex [sumatriptan] and Oxycodone   Review of Systems Review of Systems  Constitutional: Positive for appetite change and fever. Negative for chills.  HENT: Negative.   Respiratory: Negative.   Cardiovascular: Negative.   Gastrointestinal: Positive for nausea and vomiting. Negative for abdominal pain.  Genitourinary: Negative.   Musculoskeletal: Positive for back pain.  Skin: Negative.   Neurological: Positive for weakness and light-headedness.     Physical Exam Updated Vital Signs BP 131/82 (BP Location: Right Arm)   Pulse (!) 58   Temp 98.8 F (37.1 C) (Oral)   Resp 18   Ht 5\' 1"  (1.549 m)   Wt 84.8 kg (187 lb)   SpO2 96%   BMI 35.33 kg/m   Physical Exam  Constitutional: She is oriented to person, place, and time. She appears well-developed and well-nourished.  HENT:  Head: Normocephalic.  Mouth/Throat: Mucous membranes are dry.  Neck: Normal range of motion. Neck supple.  Cardiovascular: Regular rhythm. Tachycardia present.  Pulmonary/Chest: Effort normal and breath sounds normal. She has no wheezes. She has no rales.  Abdominal: Soft. Bowel sounds are normal. There is no rebound and no guarding (periumbilical).  Musculoskeletal: Normal range of motion.  Neurological: She is alert and oriented to person, place, and time.  Skin: Skin is warm and dry. No rash noted.  Psychiatric: She has a normal mood and affect.     ED Treatments / Results  Labs (all labs ordered are listed, but only abnormal results are displayed) Labs Reviewed  COMPREHENSIVE METABOLIC PANEL - Abnormal; Notable for the following components:      Result Value    Sodium 133 (*)    Potassium 3.3 (*)    Chloride 100 (*)    CO2 20 (*)    Creatinine, Ser 1.04 (*)    Total Bilirubin 2.3 (*)    All other components within normal limits  CBC WITH DIFFERENTIAL/PLATELET - Abnormal; Notable for the following components:   WBC 18.1 (*)    RBC 3.61 (*)    Hemoglobin 11.3 (*)    HCT 33.6 (*)    Neutro Abs 14.1 (*)    Monocytes Absolute 1.7 (*)    All other components within normal limits  URINALYSIS, ROUTINE W REFLEX MICROSCOPIC - Abnormal; Notable for the following components:   APPearance CLOUDY (*)    Hgb urine dipstick LARGE (*)    Ketones, ur 80 (*)    Protein, ur 100 (*)    Leukocytes, UA LARGE (*)    Bacteria, UA MANY (*)    Squamous Epithelial / LPF 0-5 (*)    All other components within normal limits  BASIC METABOLIC  PANEL - Abnormal; Notable for the following components:   Glucose, Bld 171 (*)    All other components within normal limits  CBC - Abnormal; Notable for the following components:   WBC 16.5 (*)    RBC 3.32 (*)    Hemoglobin 10.2 (*)    HCT 31.5 (*)    All other components within normal limits  CULTURE, BLOOD (ROUTINE X 2)  CULTURE, BLOOD (ROUTINE X 2)  HIV ANTIBODY (ROUTINE TESTING)  I-STAT CG4 LACTIC ACID, ED  I-STAT BETA HCG BLOOD, ED ([facility], [facility], [facility] ONLY)    EKG  EKG Interpretation None      EKG not done in ER  Radiology No results found.  Procedures Procedures (including critical care time)  Medications Ordered in ED Medications  0.9 % NaCl with KCl 20 mEq/ L  infusion ( Intravenous Stopped 06/27/17 1120)  acetaminophen (TYLENOL) tablet 650 mg (650 mg Oral Given 06/25/17 2010)  sodium chloride 0.9 % bolus 1,000 mL (0 mLs Intravenous Stopped 06/26/17 0327)  ondansetron (ZOFRAN) injection 4 mg (4 mg Intravenous Given 06/26/17 0209)  ibuprofen (ADVIL,MOTRIN) tablet 600 mg (600 mg Oral Given 06/26/17 0324)  cefTRIAXone (ROCEPHIN) 1 g in sodium chloride 0.9 % 100 mL IVPB (0 g Intravenous Stopped 06/26/17 0510)    ciprofloxacin (CIPRO) IVPB 400 mg ( Intravenous MAR Unhold 06/26/17 1433)  ciprofloxacin (CIPRO) 400 MG/200ML IVPB (  Override pull for Anesthesia 06/26/17 0102)     Initial Impression / Assessment and Plan / ED Course  I have reviewed the triage vital signs and the nursing notes.  Pertinent labs & imaging results that were available during my care of the patient were reviewed by me and considered in my medical decision making (see chart for details).    Patient seen at 1:15 am   Patient presents with general malaise and vomiting 2 days ago, with onset profound weakness, fever, lightheadedness today. She was found to be febrile to 103 on arrival, tachycardic to 119. When seen Vital signs had normalized. Fluids ordered. She has been unable to provide urine - feels dehydrated. She has PO fluids and well as IV bolus.   She is found to have a leukocytosis of 18. Normal lactic acid. Pain is addressed and controlled.   Urine obtained and is found to be significantly infected. Rocephin provided. Discussed her ongoing back pain and she admits to a history of kidney stones. DDx - pyelonephitis vs infected kidney stones. CT scan ordered.   CT scan showing large obstructing stones x 2 in right ureter. Discussed immediately with Dr. Junious Silk who will be in to see her for emergent placement of stent. Discussed with [facility], who accepts the patient for medical admission.  Final Clinical Impressions(s) / ED Diagnoses   Final diagnoses:  Ureterolithiasis  Pyelonephritis   1. Pyelonephritis 2. Ureteral stones in setting of infection  ED Discharge Orders        Ordered    sulfamethoxazole-trimethoprim (BACTRIM DS,SEPTRA DS) 800-160 MG tablet  2 times daily,   Status:  Discontinued     06/29/17 0930    sulfamethoxazole-trimethoprim (BACTRIM DS,SEPTRA DS) 800-160 MG tablet  2 times daily,   Status:  Discontinued     06/29/17 0922    sulfamethoxazole-trimethoprim (BACTRIM DS,SEPTRA DS) 800-160 MG tablet   2 times daily,   Status:  Discontinued     06/29/17 0926    Increase activity slowly     06/29/17 1350    Diet - low sodium heart healthy  06/29/17 1350       Charlann Lange, PA-C 06/26/17 4970    Tegeler, Gwenyth Allegra, MD 06/26/17 0757    Charlann Lange, PA-C 07/16/17 0800    Tegeler, Gwenyth Allegra, MD 07/16/17 1153

## 2017-06-26 NOTE — H&P (View-Only) (Signed)
Consult: Sepsis, UTI, right ureteral stones, hydronephrosis Requested by: Tegeler, Gwenyth Allegra, MD   History of Present Illness: 42 year old female who presented to emergency last night with fevers, chills, nausea, vomiting and right flank pain.  Her UA showed many bacteria, too numerous to count red cells and too numerous to count white cells.  Her white count was 18.  CT scan of the abdomen and pelvis revealed right hydronephrosis to the right proximal ureteral stone of 11 mm.  She also had a 6 mm stone at the right ureterovesical junction, possibly at the ureteral orifice.  She is added for urgent stent.  She's passed stones since 2008 but never needed surgery. In fact, she went to the ED Dec 2018 and the 6 mm stone was in the right proximal ureter, the 11 mm stone in the RLP. She also went to ED Jan 2018 when a KUB showed the renal stone but not the ureteral stone. She did not f/u with GU.   Past Medical History:  Diagnosis Date  . Hypercholesterolemia   . Hypertension   . Migraines   . Seizures ([place])    r/t biopsy   Past Surgical History:  Procedure Laterality Date  . CESAREAN SECTION    . TUBAL LIGATION      Home Medications:   (Not in a hospital admission) Allergies:  Allergies  Allergen Reactions  . Imitrex [Sumatriptan] Nausea And Vomiting  . Percocet [Oxycodone-Acetaminophen] Itching and Other (See Comments)    Body feeling like it's "burning," also     Family History  Problem Relation Age of Onset  . Hypertension Other   . Stroke Other   . Heart failure Other    Social History:  reports that she has been smoking cigarettes.  She has been smoking about 0.25 packs per day. she has never used smokeless tobacco. She reports that she drinks alcohol. She reports that she does not use drugs.  ROS: A complete review of systems was performed.  All systems are negative except for pertinent findings as noted. Review of Systems  Constitutional: Positive for chills.   Gastrointestinal: Positive for nausea.  Musculoskeletal: Positive for back pain.  Neurological: Positive for weakness.     Physical Exam:  Vital signs in last 24 hours: Temp:  [100 F (37.8 C)-103.1 F (39.5 C)] 100.6 F (38.1 C) (02/15 0436) Pulse Rate:  [75-119] 94 (02/15 0600) Resp:  [18-20] 20 (02/15 0600) BP: (126-156)/(79-100) 128/79 (02/15 0600) SpO2:  [91 %-99 %] 99 % (02/15 0600) Weight:  [84.8 kg (187 lb)] 84.8 kg (187 lb) (02/14 1959) General:  Alert and oriented, No acute distress HEENT: Normocephalic, atraumatic Cardiovascular: Regular rate and rhythm Lungs: Regular rate and effort Abdomen: Soft, nontender, nondistended, no abdominal masses Extremities: No edema Neurologic: Grossly intact  Laboratory Data:  Results for orders placed or performed during the hospital encounter of 06/26/17 (from the past 24 hour(s))  Comprehensive metabolic panel     Status: Abnormal   Collection Time: 06/25/17  8:06 PM  Result Value Ref Range   Sodium 133 (L) 135 - 145 mmol/L   Potassium 3.3 (L) 3.5 - 5.1 mmol/L   Chloride 100 (L) 101 - 111 mmol/L   CO2 20 (L) 22 - 32 mmol/L   Glucose, Bld 94 65 - 99 mg/dL   BUN 10 6 - 20 mg/dL   Creatinine, Ser 1.04 (H) 0.44 - 1.00 mg/dL   Calcium 8.9 8.9 - 10.3 mg/dL   Total Protein 7.8 6.5 - 8.1  g/dL   Albumin 4.1 3.5 - 5.0 g/dL   AST 33 15 - 41 U/L   ALT 43 14 - 54 U/L   Alkaline Phosphatase 84 38 - 126 U/L   Total Bilirubin 2.3 (H) 0.3 - 1.2 mg/dL   GFR calc non Af Amer >60 >60 mL/min   GFR calc Af Amer >60 >60 mL/min   Anion gap 13 5 - 15  CBC with Differential     Status: Abnormal   Collection Time: 06/25/17  8:06 PM  Result Value Ref Range   WBC 18.1 (H) 4.0 - 10.5 K/uL   RBC 3.61 (L) 3.87 - 5.11 MIL/uL   Hemoglobin 11.3 (L) 12.0 - 15.0 g/dL   HCT 33.6 (L) 36.0 - 46.0 %   MCV 93.1 78.0 - 100.0 fL   MCH 31.3 26.0 - 34.0 pg   MCHC 33.6 30.0 - 36.0 g/dL   RDW 13.2 11.5 - 15.5 %   Platelets 301 150 - 400 K/uL   Neutrophils  Relative % 78 %   Neutro Abs 14.1 (H) 1.7 - 7.7 K/uL   Lymphocytes Relative 12 %   Lymphs Abs 2.2 0.7 - 4.0 K/uL   Monocytes Relative 10 %   Monocytes Absolute 1.7 (H) 0.1 - 1.0 K/uL   Eosinophils Relative 0 %   Eosinophils Absolute 0.0 0.0 - 0.7 K/uL   Basophils Relative 0 %   Basophils Absolute 0.0 0.0 - 0.1 K/uL  I-Stat beta hCG blood, ED     Status: None   Collection Time: 06/25/17  8:18 PM  Result Value Ref Range   I-stat hCG, quantitative <5.0 <5 mIU/mL   Comment 3          I-Stat CG4 Lactic Acid, ED     Status: None   Collection Time: 06/25/17  8:20 PM  Result Value Ref Range   Lactic Acid, Venous 0.56 0.5 - 1.9 mmol/L  Urinalysis, Routine w reflex microscopic     Status: Abnormal   Collection Time: 06/26/17  3:21 AM  Result Value Ref Range   Color, Urine YELLOW YELLOW   APPearance CLOUDY (A) CLEAR   Specific Gravity, Urine 1.013 1.005 - 1.030   pH 6.0 5.0 - 8.0   Glucose, UA NEGATIVE NEGATIVE mg/dL   Hgb urine dipstick LARGE (A) NEGATIVE   Bilirubin Urine NEGATIVE NEGATIVE   Ketones, ur 80 (A) NEGATIVE mg/dL   Protein, ur 100 (A) NEGATIVE mg/dL   Nitrite NEGATIVE NEGATIVE   Leukocytes, UA LARGE (A) NEGATIVE   RBC / HPF TOO NUMEROUS TO COUNT 0 - 5 RBC/hpf   WBC, UA TOO NUMEROUS TO COUNT 0 - 5 WBC/hpf   Bacteria, UA MANY (A) NONE SEEN   Squamous Epithelial / LPF 0-5 (A) NONE SEEN   WBC Clumps PRESENT    Mucus PRESENT    No results found for this or any previous visit (from the past 240 hour(s)). Creatinine: Recent Labs    06/23/17 0046 06/25/17 2006  CREATININE 0.93 1.04*    Impression/Assessment/plan: 1) Fever, UTI- hospitalist admit with sepsis protocol  2) Right ureteral stones, hydronephrosis-I drew the patient and her husband a picture of the CT findings. I discussed with the patient the nature, potential benefits, risks and alternatives to cystoscopy, right RGP and right ureteral stent, including side effects of the proposed treatment, the likelihood of  the patient achieving the goals of the procedure, and any potential problems that might occur during the procedure or recuperation.  We discussed the  rationale for pre-stent, stage procedure with delayed ureteroscopy once infection is treated.  We discussed alternatives such as nephrostomy and that a nephrostomy tube would be needed if failure to gain retrograde access is encountered. We discussed why ESWL is not a good option. We also discussed we may need to perform laser lithotripsy on the distal stone if it is at the ureteral orifice and blocking the wire, but again discussed why we would not approach the large proximal stone. All questions answered. Patient elects to proceed.    Festus Aloe 06/26/2017, 8:13 AM

## 2017-06-26 NOTE — Anesthesia Procedure Notes (Signed)
Procedure Name: Intubation Date/Time: 06/26/2017 9:26 AM Performed by: Victoriano Lain, CRNA Pre-anesthesia Checklist: Patient identified, Emergency Drugs available, Suction available and Patient being monitored Patient Re-evaluated:Patient Re-evaluated prior to induction Oxygen Delivery Method: Circle system utilized Preoxygenation: Pre-oxygenation with 100% oxygen Induction Type: IV induction Ventilation: Mask ventilation without difficulty Laryngoscope Size: Glidescope and 3 Grade View: Grade I Tube type: Oral Tube size: 7.0 mm Number of attempts: 3 Airway Equipment and Method: Stylet,  Oral airway and Video-laryngoscopy Placement Confirmation: ETT inserted through vocal cords under direct vision,  positive ETCO2 and breath sounds checked- equal and bilateral Secured at: 22 cm Tube secured with: Tape Dental Injury: Teeth and Oropharynx as per pre-operative assessment  Difficulty Due To: Difficult Airway- due to large tongue and Difficult Airway- due to anterior larynx Comments: DL x 1 Miller 2 unable to obtain view. DL x 1 Mac 4 with grade II view. L Kwadwo Taras DL x 1 Mac 4 with negative end-tidal, ETT removed, two-hand mask with VSS. VDL x 1 with glidescope with grade I view

## 2017-06-26 NOTE — Anesthesia Preprocedure Evaluation (Addendum)
Anesthesia Evaluation  Patient identified by MRN, date of birth, ID band Patient awake    Reviewed: Allergy & Precautions, NPO status , Patient's Chart, lab work & pertinent test results  Airway Mallampati: II  TM Distance: >3 FB Neck ROM: Full    Dental no notable dental hx.    Pulmonary Current Smoker,    Pulmonary exam normal breath sounds clear to auscultation       Cardiovascular hypertension, Pt. on medications Normal cardiovascular exam Rhythm:Regular Rate:Normal     Neuro/Psych  Headaches, Seizures -, Well Controlled,  negative psych ROS   GI/Hepatic negative GI ROS, Neg liver ROS,   Endo/Other  negative endocrine ROS  Renal/GU negative Renal ROS     Musculoskeletal negative musculoskeletal ROS (+)   Abdominal (+) + obese,   Peds  Hematology  (+) anemia , HLD   Anesthesia Other Findings right ureteral stone  Reproductive/Obstetrics                            Anesthesia Physical Anesthesia Plan  ASA: III and emergent  Anesthesia Plan: General   Post-op Pain Management:    Induction: Intravenous  PONV Risk Score and Plan: 2 and Dexamethasone, Ondansetron, Midazolam and Treatment may vary due to age or medical condition  Airway Management Planned: LMA  Additional Equipment:   Intra-op Plan:   Post-operative Plan: Extubation in OR  Informed Consent: I have reviewed the patients History and Physical, chart, labs and discussed the procedure including the risks, benefits and alternatives for the proposed anesthesia with the patient or authorized representative who has indicated his/her understanding and acceptance.   Dental advisory given  Plan Discussed with: CRNA  Anesthesia Plan Comments:       Anesthesia Quick Evaluation

## 2017-06-26 NOTE — Progress Notes (Signed)
Spoke with Dr Daleen Bo, d/t needing clarification of level of care needed postop, MD said patient is to go to tele bed post-op.

## 2017-06-27 ENCOUNTER — Encounter (HOSPITAL_COMMUNITY): Payer: Self-pay | Admitting: Urology

## 2017-06-27 DIAGNOSIS — E876 Hypokalemia: Secondary | ICD-10-CM

## 2017-06-27 DIAGNOSIS — N200 Calculus of kidney: Secondary | ICD-10-CM

## 2017-06-27 DIAGNOSIS — E871 Hypo-osmolality and hyponatremia: Secondary | ICD-10-CM

## 2017-06-27 DIAGNOSIS — A419 Sepsis, unspecified organism: Secondary | ICD-10-CM

## 2017-06-27 DIAGNOSIS — I10 Essential (primary) hypertension: Secondary | ICD-10-CM

## 2017-06-27 DIAGNOSIS — R6520 Severe sepsis without septic shock: Secondary | ICD-10-CM

## 2017-06-27 HISTORY — DX: Calculus of kidney: N20.0

## 2017-06-27 LAB — BASIC METABOLIC PANEL
ANION GAP: 7 (ref 5–15)
BUN: 12 mg/dL (ref 6–20)
CALCIUM: 8.9 mg/dL (ref 8.9–10.3)
CHLORIDE: 110 mmol/L (ref 101–111)
CO2: 24 mmol/L (ref 22–32)
Creatinine, Ser: 0.87 mg/dL (ref 0.44–1.00)
GFR calc non Af Amer: >60 mL/min (ref >60)
Glucose, Bld: 171 mg/dL — ABNORMAL HIGH (ref 65–99)
Potassium: 4.1 mmol/L (ref 3.5–5.1)
SODIUM: 141 mmol/L (ref 135–145)

## 2017-06-27 LAB — CBC
HCT: 31.5 % — ABNORMAL LOW (ref 36.0–46.0)
HEMOGLOBIN: 10.2 g/dL — AB (ref 12.0–15.0)
MCH: 30.7 pg (ref 26.0–34.0)
MCHC: 32.4 g/dL (ref 30.0–36.0)
MCV: 94.9 fL (ref 78.0–100.0)
PLATELETS: 318 10*3/uL (ref 150–400)
RBC: 3.32 MIL/uL — AB (ref 3.87–5.11)
RDW: 13.1 % (ref 11.5–15.5)
WBC: 16.5 10*3/uL — AB (ref 4.0–10.5)

## 2017-06-27 LAB — HIV ANTIBODY (ROUTINE TESTING W REFLEX): HIV Screen 4th Generation wRfx: NONREACTIVE

## 2017-06-27 MED ORDER — HYDROXYZINE HCL 10 MG PO TABS: 10.0000 mg | ORAL_TABLET | Freq: Three times a day (TID) | ORAL | Status: DC | PRN

## 2017-06-27 MED ORDER — FENTANYL CITRATE (PF) 100 MCG/2ML IJ SOLN
50.0000 ug | INTRAMUSCULAR | Status: DC | PRN
  Administered 2017-06-27 (×2): 50 ug via INTRAVENOUS
  Filled 2017-06-27 (×2): qty 2

## 2017-06-27 MED ORDER — OXYCODONE-ACETAMINOPHEN 5-325 MG PO TABS: 1.0000 | ORAL_TABLET | ORAL | Status: DC | PRN

## 2017-06-27 MED ORDER — ZOLPIDEM TARTRATE 5 MG PO TABS
5.0000 mg | ORAL_TABLET | Freq: Every evening | ORAL | Status: DC | PRN
  Administered 2017-06-27 – 2017-06-28 (×2): 5 mg via ORAL
  Filled 2017-06-27 (×2): qty 1

## 2017-06-27 MED ORDER — ACETAMINOPHEN 325 MG PO TABS
650.0000 mg | ORAL_TABLET | Freq: Four times a day (QID) | ORAL | Status: DC | PRN
  Administered 2017-06-27 – 2017-06-29 (×3): 650 mg via ORAL
  Filled 2017-06-27 (×3): qty 2

## 2017-06-27 MED ORDER — OXYBUTYNIN CHLORIDE 5 MG PO TABS: 5.0000 mg | ORAL_TABLET | Freq: Three times a day (TID) | ORAL | Status: DC | PRN

## 2017-06-27 NOTE — Progress Notes (Signed)
[facility] PROGRESS NOTE    Progress Note  Tracy Johns  JSE:[id number] DOB: Dec 02, 1975 DOA: 06/26/2017 PCP: Clent Demark, PA-C     Brief Narrative:   Tracy Johns is an 42 y.o. female past medical history of essential hypertension, nephrolithiasis tobacco abuse presents with fever and chills, nausea and vomiting and right-sided back pain, CT scan in the ED showed an obstructive stone, it was discussed with Dr. Milus Glazier who is planning for surgical intervention.  Assessment/Plan:   Sepsis due to acute Pyelonephritis in the setting of nephrolithiasis: CT scan of the abdomen showed a 9 mm stone with moderate hydronephrosis and perinephric stranding, urology was consulted who performed surgical intervention on 06/26/2016 with stenting on the right. Defervesced her leukocytosis is improved, will continue empiric IV Rocephin. Culture data is pending. Discontinue telemetry  Hypokalemia Repleted orally.  Hyponatremia Resolved with IV fluid hydration.  Essential hypertension Continue current home regimen.    DVT prophylaxis: lovenox Family Communication:husband Disposition Plan/Barrier to D/C: home in 2 days Code Status:     Code Status Orders  (From admission, onward)        Start     Ordered   06/26/17 0732  Full code  Continuous     06/26/17 0735    Code Status History    Date Active Date Inactive Code Status Order ID Comments User Context   This patient has a current code status but no historical code status.        IV Access:    Peripheral IV   Procedures and diagnostic studies:   Dg Chest 2 View  Result Date: 06/25/2017 CLINICAL DATA:  Fever, cough and congestion for 48 hours. EXAM: CHEST  2 VIEW COMPARISON:  None. FINDINGS: The cardiac silhouette, mediastinal and hilar contours are within normal limits. The lungs are clear. No infiltrates or effusions. The bony thorax is intact. IMPRESSION: No acute cardiopulmonary  findings. Electronically Signed   By: Marijo Sanes M.D.   On: 06/25/2017 20:28   Dg C-arm 1-60 Min-no Report  Result Date: 06/26/2017 Fluoroscopy was utilized by the requesting physician.  No radiographic interpretation.   Ct Renal Stone Study  Result Date: 06/26/2017 CLINICAL DATA:  Flank pain. EXAM: CT ABDOMEN AND PELVIS WITHOUT CONTRAST TECHNIQUE: Multidetector CT imaging of the abdomen and pelvis was performed following the standard protocol without IV contrast. COMPARISON:  CT 05/03/2017 FINDINGS: Lower chest: No consolidation or pleural fluid. Minimal hypoventilatory changes in the lung bases. Hepatobiliary: Decreased hepatic density consistent with steatosis. No focal lesion allowing for lack contrast. Gallbladder physiologically distended, no calcified stone. No biliary dilatation. Pancreas: No ductal dilatation or inflammation. Spleen: Normal in size without focal abnormality. Adrenals/Urinary Tract: No adrenal nodule. Obstructing 9 x 11 mm stone in the right proximal ureter (at the level of L3-L4) with moderate proximal hydroureteronephrosis and perinephric edema. There is a 6 x 7 mm stone at the right ureterovesicular junction. No left hydronephrosis. No left urolithiasis. Urinary bladder is physiologically distended without wall thickening. Stomach/Bowel: Stomach is nondistended. No small bowel inflammation, obstruction or wall thickening. Mild wall thickening of the ascending colon adjacent to renal inflammation likely reactive. Small to moderate colonic stool burden. Normal appendix. Vascular/Lymphatic: Mild aorta bi-iliac atherosclerosis. Retroaortic left renal vein. No bulky abdominal or pelvic adenopathy. Small calcified density in the pelvis likely calcified lymph node. Reproductive: Uterus and bilateral adnexa are unremarkable. Small amount of free fluid in the pelvis may be physiologic or reactive. Other: No free air or intra-abdominal abscess. Musculoskeletal:  Mild facet arthropathy at  L5-S1. There are no acute or suspicious osseous abnormalities. IMPRESSION: 1. Obstructing 9 x 11 mm stone in the right proximal ureter with moderate hydroureteronephrosis and perinephric edema. There is an additional 6 x 7 stone at the right ureterovesicular junction. 2. Hepatic steatosis. 3.  Aortic Atherosclerosis (ICD10-I70.0). Electronically Signed   By: Jeb Levering M.D.   On: 06/26/2017 06:30     Medical Consultants:    None.  Anti-Infectives:   IV rocephin  Subjective:    Tracy Johns she relates she continues to have back pain, with labile motion.  Objective:    Vitals:   06/26/17 1400 06/26/17 1422 06/26/17 2152 06/27/17 0419  BP: (!) 138/93 (!) 150/101 (!) 148/88 (!) 156/86  Pulse: 100 91 64 65  Resp: (!) 21 19 18 18   Temp:  97.7 F (36.5 C) 98.3 F (36.8 C) 98.2 F (36.8 C)  TempSrc:   Oral Oral  SpO2: 97% 95% 98% 95%  Weight:      Height:        Intake/Output Summary (Last 24 hours) at 06/27/2017 0832 Last data filed at 06/27/2017 0600 Gross per 24 hour  Intake 4191.25 ml  Output 3025 ml  Net 1166.25 ml   Filed Weights   06/25/17 1959  Weight: 84.8 kg (187 lb)    Exam: General exam: In no acute distress. Respiratory system: Good air movement and clear to auscultation. Cardiovascular system: S1 & S2 heard, RRR. No JVD, murmurs, rubs, gallops or clicks.  Gastrointestinal system: Abdomen is nondistended, soft and nontender.  Central nervous system: Alert and oriented. No focal neurological deficits. Extremities: No pedal edema. Skin: No rashes, lesions or ulcers Psychiatry: Judgement and insight appear normal. Mood & affect appropriate.    Data Reviewed:    Labs: Basic Metabolic Panel: Recent Labs  Lab 06/23/17 0046 06/25/17 2006 06/27/17 0410  NA 138 133* 141  K 3.4* 3.3* 4.1  CL 103 100* 110  CO2 24 20* 24  GLUCOSE 124* 94 171*  BUN 10 10 12   CREATININE 0.93 1.04* 0.87  CALCIUM 9.3 8.9 8.9   GFR Estimated  Creatinine Clearance: 83.2 mL/min (by C-G formula based on SCr of 0.87 mg/dL). Liver Function Tests: Recent Labs  Lab 06/23/17 0046 06/25/17 2006  AST 20 33  ALT 21 43  ALKPHOS 86 84  BILITOT 0.6 2.3*  PROT 7.3 7.8  ALBUMIN 4.1 4.1   Recent Labs  Lab 06/23/17 0046  LIPASE 21   No results for input(s): AMMONIA in the last 168 hours. Coagulation profile No results for input(s): INR, PROTIME in the last 168 hours.  CBC: Recent Labs  Lab 06/23/17 0046 06/25/17 2006 06/27/17 0410  WBC 13.0* 18.1* 16.5*  NEUTROABS  --  14.1*  --   HGB 12.0 11.3* 10.2*  HCT 36.3 33.6* 31.5*  MCV 94.5 93.1 94.9  PLT 367 301 318   Cardiac Enzymes: No results for input(s): CKTOTAL, CKMB, CKMBINDEX, TROPONINI in the last 168 hours. BNP (last 3 results) No results for input(s): PROBNP in the last 8760 hours. CBG: No results for input(s): GLUCAP in the last 168 hours. D-Dimer: No results for input(s): DDIMER in the last 72 hours. Hgb A1c: No results for input(s): HGBA1C in the last 72 hours. Lipid Profile: No results for input(s): CHOL, HDL, LDLCALC, TRIG, CHOLHDL, LDLDIRECT in the last 72 hours. Thyroid function studies: No results for input(s): TSH, T4TOTAL, T3FREE, THYROIDAB in the last 72 hours.  Invalid input(s): FREET3 Anemia  work up: No results for input(s): VITAMINB12, FOLATE, FERRITIN, TIBC, IRON, RETICCTPCT in the last 72 hours. Sepsis Labs: Recent Labs  Lab 06/23/17 0046 06/25/17 2006 06/25/17 2020 06/27/17 0410  WBC 13.0* 18.1*  --  16.5*  LATICACIDVEN  --   --  0.56  --    Microbiology No results found for this or any previous visit (from the past 240 hour(s)).   Medications:   . [START ON 06/28/2017] cabergoline  0.25 mg Oral Q Sun  . enoxaparin (LOVENOX) injection  40 mg Subcutaneous Q24H  . lisinopril  10 mg Oral Daily  . sodium chloride flush  3 mL Intravenous Q12H  . tamsulosin  0.4 mg Oral Daily   Continuous Infusions: . cefTRIAXone (ROCEPHIN)  IV  Stopped (06/27/17 0509)      LOS: 1 day   Charlynne Cousins  [facility] Pager [phone]  *Please refer to [email or web address], password [email or web address] to get updated schedule on who will round on this patient, as hospitalists switch teams weekly. If 7PM-7AM, please contact night-coverage at [email or web address], password [email or web address] for any overnight needs.  06/27/2017, 8:32 AM

## 2017-06-27 NOTE — Consult Note (Signed)
Urology Progress Note   1 Day Post-Op  Subjective: 42 year old female who presented with fevers, CT scan which revealed an 32mm R proximal obstructing stone and a 86mm right distal stone. UA consistent with UTI. She underwent emergency right ureteral stent placement 2/15. Foley catheter placed in OR.   Since surgery, patient has been doing overall well. VSS, afebrile for last 24 hours. Cr at baseline, leukocytosis improving. Making adequate urine. Has significant discomfort from foley catheter limiting mobility. Blood cultures pending, no urine culture sent on admission.     Objective: Vital signs in last 24 hours: Temp:  [97.4 F (36.3 C)-98.3 F (36.8 C)] 98.2 F (36.8 C) (02/16 0419) Pulse Rate:  [64-110] 65 (02/16 0419) Resp:  [13-22] 18 (02/16 0419) BP: (130-156)/(84-101) 156/86 (02/16 0419) SpO2:  [95 %-100 %] 95 % (02/16 0419)  Intake/Output from previous day: 02/15 0701 - 02/16 0700 In: 4191.3 [P.O.:360; I.V.:3531.3; IV Piggyback:300] Out: 3025 [Urine:3025] Intake/Output this shift: No intake/output data recorded.  Physical Exam:  General: Alert and oriented CV: RRR Lungs: Clear Abdomen: Soft, appropriately tender GU: Foley with clear yellow urine in tubing  Ext: NT, No erythema  Lab Results: Recent Labs    06/25/17 2006 06/27/17 0410  HGB 11.3* 10.2*  HCT 33.6* 31.5*   BMET Recent Labs    06/25/17 2006 06/27/17 0410  NA 133* 141  K 3.3* 4.1  CL 100* 110  CO2 20* 24  GLUCOSE 94 171*  BUN 10 12  CREATININE 1.04* 0.87  CALCIUM 8.9 8.9     Studies/Results: Dg Chest 2 View  Result Date: 06/25/2017 CLINICAL DATA:  Fever, cough and congestion for 48 hours. EXAM: CHEST  2 VIEW COMPARISON:  None. FINDINGS: The cardiac silhouette, mediastinal and hilar contours are within normal limits. The lungs are clear. No infiltrates or effusions. The bony thorax is intact. IMPRESSION: No acute cardiopulmonary findings. Electronically Signed   By: Marijo Sanes M.D.    On: 06/25/2017 20:28   Dg C-arm 1-60 Min-no Report  Result Date: 06/26/2017 Fluoroscopy was utilized by the requesting physician.  No radiographic interpretation.   Ct Renal Stone Study  Result Date: 06/26/2017 CLINICAL DATA:  Flank pain. EXAM: CT ABDOMEN AND PELVIS WITHOUT CONTRAST TECHNIQUE: Multidetector CT imaging of the abdomen and pelvis was performed following the standard protocol without IV contrast. COMPARISON:  CT 05/03/2017 FINDINGS: Lower chest: No consolidation or pleural fluid. Minimal hypoventilatory changes in the lung bases. Hepatobiliary: Decreased hepatic density consistent with steatosis. No focal lesion allowing for lack contrast. Gallbladder physiologically distended, no calcified stone. No biliary dilatation. Pancreas: No ductal dilatation or inflammation. Spleen: Normal in size without focal abnormality. Adrenals/Urinary Tract: No adrenal nodule. Obstructing 9 x 11 mm stone in the right proximal ureter (at the level of L3-L4) with moderate proximal hydroureteronephrosis and perinephric edema. There is a 6 x 7 mm stone at the right ureterovesicular junction. No left hydronephrosis. No left urolithiasis. Urinary bladder is physiologically distended without wall thickening. Stomach/Bowel: Stomach is nondistended. No small bowel inflammation, obstruction or wall thickening. Mild wall thickening of the ascending colon adjacent to renal inflammation likely reactive. Small to moderate colonic stool burden. Normal appendix. Vascular/Lymphatic: Mild aorta bi-iliac atherosclerosis. Retroaortic left renal vein. No bulky abdominal or pelvic adenopathy. Small calcified density in the pelvis likely calcified lymph node. Reproductive: Uterus and bilateral adnexa are unremarkable. Small amount of free fluid in the pelvis may be physiologic or reactive. Other: No free air or intra-abdominal abscess. Musculoskeletal: Mild facet  arthropathy at L5-S1. There are no acute or suspicious osseous  abnormalities. IMPRESSION: 1. Obstructing 9 x 11 mm stone in the right proximal ureter with moderate hydroureteronephrosis and perinephric edema. There is an additional 6 x 7 stone at the right ureterovesicular junction. 2. Hepatic steatosis. 3.  Aortic Atherosclerosis (ICD10-I70.0). Electronically Signed   By: Jeb Levering M.D.   On: 06/26/2017 06:30    Assessment/Plan:  42 y.o. female w. Hx nephrolithiasis s/p right ureteral stent placement 2/15 for infected obstructing right ureteral stones. Improving since decompression on IV antibiotics  - d/c foley catheter, TOV - oxybutinin PRN for bladder spasms - follow up blood cultures to guide antibiotic therapy  - we will schedule for patient to be seen in urology clinic in next ~2 weeks to discuss her future surgery for stone treatment as an outpatient  - please send home on flomax and with oxybutinin script for bladder spasms   Urology will sign off, please repage for any questions or concerns    LOS: 1 day   Tracy Johns L 06/27/2017, 10:13 AM

## 2017-06-28 MED ORDER — HYDROCODONE-ACETAMINOPHEN 5-325 MG PO TABS
1.0000 | ORAL_TABLET | ORAL | Status: DC | PRN
Start: 2017-06-28 — End: 2017-06-29
  Administered 2017-06-28 (×3): 1 via ORAL
  Filled 2017-06-28 (×3): qty 1

## 2017-06-28 NOTE — Progress Notes (Signed)
[facility] PROGRESS NOTE    Progress Note  Tracy Johns  DVV:[id number] DOB: 1975-10-07 DOA: 06/26/2017 PCP: Clent Demark, PA-C     Brief Narrative:   Tracy Johns is an 42 y.o. female past medical history of essential hypertension, nephrolithiasis tobacco abuse presents with fever and chills, nausea and vomiting and right-sided back pain, CT scan in the ED showed an obstructive stone, it was discussed with Dr. Milus Glazier who is planning for surgical intervention.  Assessment/Plan:   Sepsis due to acute Pyelonephritis in the setting of nephrolithiasis: CT scan of the abdomen showed a 9 mm stone with moderate hydronephrosis and perinephric stranding, urology was consulted who performed surgical intervention on 06/26/2016 with stenting on the right. She has defervesced leukocytosis improve Culture data is pending. Vital signs are stable.  She is tolerating her diet.  Hypokalemia Resolved with oral repletion.  Hypovolemic hyponatremia Resolved with IV fluid hydration.  Essential hypertension Continue current home regimen.  DVT prophylaxis: lovenox Family Communication:husband Disposition Plan/Barrier to D/C: home in 2 days Code Status:     Code Status Orders  (From admission, onward)        Start     Ordered   06/26/17 0732  Full code  Continuous     06/26/17 0735    Code Status History    Date Active Date Inactive Code Status Order ID Comments User Context   This patient has a current code status but no historical code status.        IV Access:    Peripheral IV   Procedures and diagnostic studies:   Dg C-arm 1-60 Min-no Report  Result Date: 06/26/2017 Fluoroscopy was utilized by the requesting physician.  No radiographic interpretation.     Medical Consultants:    None.  Anti-Infectives:   IV rocephin  Subjective:    Tracy Johns pain is controlled.  Objective:    Vitals:   06/27/17 0419  06/27/17 1336 06/27/17 2024 06/28/17 0406  BP: (!) 156/86 133/70 (!) 142/81 (!) 143/81  Pulse: 65 63 86 82  Resp: 18 18 18 18   Temp: 98.2 F (36.8 C) 98.1 F (36.7 C) 99.7 F (37.6 C) 99.5 F (37.5 C)  TempSrc: Oral Oral Oral Oral  SpO2: 95% 99% 96% 93%  Weight:      Height:        Intake/Output Summary (Last 24 hours) at 06/28/2017 0814 Last data filed at 06/28/2017 0544 Gross per 24 hour  Intake 580 ml  Output 1175 ml  Net -595 ml   Filed Weights   06/25/17 1959  Weight: 84.8 kg (187 lb)    Exam: General exam: In no acute distress. Respiratory system: Good air movement and clear to auscultation. Cardiovascular system: S1 & S2 heard, RRR. No JVD, murmurs, rubs, gallops or clicks.  Gastrointestinal system: Abdomen is nondistended, soft and nontender.  Central nervous system: Alert and oriented. No focal neurological deficits. Extremities: No pedal edema. Skin: No rashes, lesions or ulcers Psychiatry: Judgement and insight appear normal. Mood & affect appropriate.    Data Reviewed:    Labs: Basic Metabolic Panel: Recent Labs  Lab 06/23/17 0046 06/25/17 2006 06/27/17 0410  NA 138 133* 141  K 3.4* 3.3* 4.1  CL 103 100* 110  CO2 24 20* 24  GLUCOSE 124* 94 171*  BUN 10 10 12   CREATININE 0.93 1.04* 0.87  CALCIUM 9.3 8.9 8.9   GFR Estimated Creatinine Clearance: 83.2 mL/min (by C-G formula based on SCr of 0.87 mg/dL).  Liver Function Tests: Recent Labs  Lab 06/23/17 0046 06/25/17 2006  AST 20 33  ALT 21 43  ALKPHOS 86 84  BILITOT 0.6 2.3*  PROT 7.3 7.8  ALBUMIN 4.1 4.1   Recent Labs  Lab 06/23/17 0046  LIPASE 21   No results for input(s): AMMONIA in the last 168 hours. Coagulation profile No results for input(s): INR, PROTIME in the last 168 hours.  CBC: Recent Labs  Lab 06/23/17 0046 06/25/17 2006 06/27/17 0410  WBC 13.0* 18.1* 16.5*  NEUTROABS  --  14.1*  --   HGB 12.0 11.3* 10.2*  HCT 36.3 33.6* 31.5*  MCV 94.5 93.1 94.9  PLT 367 301  318   Cardiac Enzymes: No results for input(s): CKTOTAL, CKMB, CKMBINDEX, TROPONINI in the last 168 hours. BNP (last 3 results) No results for input(s): PROBNP in the last 8760 hours. CBG: No results for input(s): GLUCAP in the last 168 hours. D-Dimer: No results for input(s): DDIMER in the last 72 hours. Hgb A1c: No results for input(s): HGBA1C in the last 72 hours. Lipid Profile: No results for input(s): CHOL, HDL, LDLCALC, TRIG, CHOLHDL, LDLDIRECT in the last 72 hours. Thyroid function studies: No results for input(s): TSH, T4TOTAL, T3FREE, THYROIDAB in the last 72 hours.  Invalid input(s): FREET3 Anemia work up: No results for input(s): VITAMINB12, FOLATE, FERRITIN, TIBC, IRON, RETICCTPCT in the last 72 hours. Sepsis Labs: Recent Labs  Lab 06/23/17 0046 06/25/17 2006 06/25/17 2020 06/27/17 0410  WBC 13.0* 18.1*  --  16.5*  LATICACIDVEN  --   --  0.56  --    Microbiology Recent Results (from the past 240 hour(s))  Blood Culture (routine x 2)     Status: None (Preliminary result)   Collection Time: 06/26/17  6:49 AM  Result Value Ref Range Status   Specimen Description   Final    BLOOD RIGHT ANTECUBITAL Performed at [facility] [address] [place], [place] [address]    Special Requests   Final    BOTTLES DRAWN AEROBIC AND ANAEROBIC Blood Culture adequate volume Performed at [facility] [address] [place], [place] [address]    Culture   Final    NO GROWTH 1 DAY Performed at [facility], [place] [address], [place], [place] [address]    Report Status PENDING  Incomplete  Blood Culture (routine x 2)     Status: None (Preliminary result)   Collection Time: 06/26/17 10:48 AM  Result Value Ref Range Status   Specimen Description   Final    BLOOD LEFT HAND Performed at [facility] [address], [place], [place] [address]    Special Requests   Final    IN PEDIATRIC BOTTLE Blood Culture  adequate volume Performed at [facility] [address] [place], [place] [address]    Culture   Final    NO GROWTH 1 DAY Performed at [facility], [place] [address] [place], [place] [address]    Report Status PENDING  Incomplete     Medications:   . cabergoline  0.25 mg Oral Q Sun  . enoxaparin (LOVENOX) injection  40 mg Subcutaneous Q24H  . lisinopril  10 mg Oral Daily  . sodium chloride flush  3 mL Intravenous Q12H  . tamsulosin  0.4 mg Oral Daily   Continuous Infusions: . cefTRIAXone (ROCEPHIN)  IV Stopped (06/28/17 0445)      LOS: 2 days   Charlynne Cousins  [facility] Pager [phone]  *Please refer to [email or web address], password [email or web address] to get updated schedule on who will round on this patient, as hospitalists switch teams weekly. If 7PM-7AM, please contact night-coverage at [email or web address], password [email or web address] for any overnight needs.  06/28/2017, 8:14 AM

## 2017-06-28 NOTE — Progress Notes (Signed)
Nutrition Brief Note  Patient identified on the Malnutrition Screening Tool (MST) Report  Pt currently tolerating diet, consuming 50-75% of meals. Pt's weight has decreased recently. Was 193 lb in 2017 which is closer to current weight.  Wt Readings from Last 15 Encounters:  06/25/17 187 lb (84.8 kg)  06/23/17 187 lb (84.8 kg)  05/14/17 204 lb (92.5 kg)  05/06/17 204 lb (92.5 kg)  04/06/17 209 lb (94.8 kg)  10/02/16 202 lb (91.6 kg)  08/07/16 202 lb 6.4 oz (91.8 kg)  07/10/16 204 lb 6.4 oz (92.7 kg)  09/03/15 193 lb 9 oz (87.8 kg)    Body mass index is 35.33 kg/m. Patient meets criteria for obesity based on current BMI.   Current diet order is regular, patient is consuming approximately 75% of meals at this time. Labs and medications reviewed.   No nutrition interventions warranted at this time. If nutrition issues arise, please consult RD.   Clayton Bibles, MS, RD, [facility] Pager: [phone] After Hours Pager: [phone]

## 2017-06-29 MED ORDER — SULFAMETHOXAZOLE-TRIMETHOPRIM 800-160 MG PO TABS: 1.0000 | ORAL_TABLET | Freq: Two times a day (BID) | ORAL | 0 refills | Status: DC

## 2017-06-29 NOTE — Discharge Instructions (Signed)
Ureteral Stent Implantation, Care After Refer to this sheet in the next few weeks. These instructions provide you with information about caring for yourself after your procedure. Your health care provider may also give you more specific instructions. Your treatment has been planned according to current medical practices, but problems sometimes occur. Call your health care provider if you have any problems or questions after your procedure.  Removal of the stent: Be certain to follow-up with Dr. Junious Silk for removal of the stent/stones.  What can I expect after the procedure? After the procedure, it is common to have:  Nausea.  Mild pain when you urinate. You may feel this pain in your lower back or lower abdomen. Pain should stop within a few minutes after you urinate. This may last for up to 1 week.  A small amount of blood in your urine for several days.  Follow these instructions at home:  Medicines  Take over-the-counter and prescription medicines only as told by your health care provider.  If you were prescribed an antibiotic medicine, take it as told by your health care provider. Do not stop taking the antibiotic even if you start to feel better.  Do not drive for 24 hours if you received a sedative.  Do not drive or operate heavy machinery while taking prescription pain medicines. Activity  Return to your normal activities as told by your health care provider. Ask your health care provider what activities are safe for you.  Do not lift anything that is heavier than 10 lb (4.5 kg). Follow this limit for 1 week after your procedure, or for as long as told by your health care provider. General instructions  Watch for any blood in your urine. Call your health care provider if the amount of blood in your urine increases.  If you have a catheter: ? Follow instructions from your health care provider about taking care of your catheter and collection bag. ? Do not take baths, swim,  or use a hot tub until your health care provider approves.  Drink enough fluid to keep your urine clear or pale yellow.  Keep all follow-up visits as told by your health care provider. This is important. Contact a health care provider if:  You have pain that gets worse or does not get better with medicine, especially pain when you urinate.  You have difficulty urinating.  You feel nauseous or you vomit repeatedly during a period of more than 2 days after the procedure. Get help right away if:  Your urine is dark red or has blood clots in it.  You are leaking urine (have incontinence).  The end of the stent comes out of your urethra.  You cannot urinate.  You have sudden, sharp, or severe pain in your abdomen or lower back.  You have a fever. This information is not intended to replace advice given to you by your health care provider. Make sure you discuss any questions you have with your health care provider. Document Released: 12/29/2012 Document Revised: 10/04/2015 Document Reviewed: 11/10/2014 Elsevier Interactive Patient Education  2018 [place] was admitted to the Hospital on 06/26/2017 and Discharged on Discharge Date 06/29/2017 and should be excused from work/school   for 4  days starting 06/26/2017 , may return to work/school without any restrictions.  Call Bess Harvest MD, [facility] [phone] with questions.  Charlynne Cousins M.D on 06/29/2017,at 8:53 AM  [facility] Office  [phone]

## 2017-06-29 NOTE — Discharge Summary (Signed)
Physician Discharge Summary  Zeyna Mkrtchyan GGY:[id number] DOB: Jul 08, 1975 DOA: 06/26/2017  PCP: Clent Demark, PA-C  Admit date: 06/26/2017 Discharge date: 06/29/2017  Admitted From: home Disposition:  Home  Recommendations for Outpatient Follow-up:  1. Follow up with PCP in 1-2 weeks   Home Health:no Equipment/Devices:none  Discharge Condition:stable CODE STATUS:full Diet recommendation: Heart Healthy  Brief/Interim Summary: 42 y.o. female past medical history of essential hypertension, nephrolithiasis tobacco abuse presents with fever and chills, nausea and vomiting and right-sided back pain, CT scan in the ED showed an obstructive stone, it was discussed with Dr. Milus Glazier who is planning for surgical intervention.  Discharge Diagnoses:  Active Problems:   Pyelonephritis   Nephrolithiasis   Hypokalemia   Hyponatremia   Essential hypertension   Severe sepsis (HCC)  Severe sepsis due to acute pyelonephritis in the setting of nephrolithiasis: CT scan of the abdomen and pelvis showed a 9 mm stones with moderate hydro-nephrosis and perinephric stranding, urology was consulted to perform stent placement on 06/26/2017. The patient defervesced leukocytosis improved culture data was unreliable. She was changed to oral Bactrim which she will continue as an outpatient for 2 weeks.  Hypokalemia: Was repleted orally now resolved.  Hypovolemic hyponatremia: With IV fluid hydration.  Essential hypertension: Continue current home regimen. Discharge Instructions   Allergies as of 06/29/2017      Reactions   Imitrex [sumatriptan] Nausea And Vomiting   Percocet [oxycodone-acetaminophen] Itching, Other (See Comments)   Body feeling like it's "burning," also      Medication List    STOP taking these medications   cephALEXin 500 MG capsule Commonly known as:  KEFLEX     TAKE these medications   aspirin-acetaminophen-caffeine 250-250-65 MG tablet Commonly known  as:  EXCEDRIN MIGRAINE Take 1-2 tablets by mouth every 6 (six) hours as needed for headache.   cabergoline 0.5 MG tablet Commonly known as:  DOSTINEX Take 0.5 tablets (0.25 mg total) by mouth once a week. What changed:  when to take this   hydrochlorothiazide 12.5 MG tablet Commonly known as:  HYDRODIURIL Take 1 tablet (12.5 mg total) by mouth daily.   HYDROcodone-acetaminophen 5-325 MG tablet Commonly known as:  NORCO/VICODIN Take 1-2 tablets by mouth every 4 (four) hours as needed for moderate pain.   ibuprofen 800 MG tablet Commonly known as:  ADVIL,MOTRIN Take 1 tablet (800 mg total) by mouth every 8 (eight) hours as needed for moderate pain (for headaches).   lisinopril 10 MG tablet Commonly known as:  PRINIVIL,ZESTRIL Take 1 tablet (10 mg total) by mouth daily.   ondansetron 4 MG disintegrating tablet Commonly known as:  ZOFRAN ODT Take 1 tablet (4 mg total) by mouth every 8 (eight) hours as needed for nausea or vomiting.   ondansetron 4 MG tablet Commonly known as:  ZOFRAN Take 1 tablet (4 mg total) by mouth every 6 (six) hours.   promethazine 25 MG tablet Commonly known as:  PHENERGAN Take 1 tablet (25 mg total) by mouth every 6 (six) hours as needed for nausea.   sulfamethoxazole-trimethoprim 800-160 MG tablet Commonly known as:  BACTRIM DS,SEPTRA DS Take 1 tablet by mouth 2 (two) times daily.   tamsulosin 0.4 MG Caps capsule Commonly known as:  FLOMAX Take 1 capsule (0.4 mg total) by mouth daily.   VENTOLIN HFA 108 (90 Base) MCG/ACT inhaler Generic drug:  albuterol Inhale 2 puffs into the lungs every 6 (six) hours as needed for wheezing or shortness of breath.      Follow-up Information  Festus Aloe, MD.   Specialty:  Urology Why:  Office will call with appointment Contact information: [address] [place] [address] [phone]          Allergies  Allergen Reactions  . Imitrex [Sumatriptan] Nausea And Vomiting  . Percocet  [Oxycodone-Acetaminophen] Itching and Other (See Comments)    Body feeling like it's "burning," also     Consultations:  Urology   Procedures/Studies: Dg Chest 2 View  Result Date: 06/25/2017 CLINICAL DATA:  Fever, cough and congestion for 48 hours. EXAM: CHEST  2 VIEW COMPARISON:  None. FINDINGS: The cardiac silhouette, mediastinal and hilar contours are within normal limits. The lungs are clear. No infiltrates or effusions. The bony thorax is intact. IMPRESSION: No acute cardiopulmonary findings. Electronically Signed   By: Marijo Sanes M.D.   On: 06/25/2017 20:28   Dg C-arm 1-60 Min-no Report  Result Date: 06/26/2017 Fluoroscopy was utilized by the requesting physician.  No radiographic interpretation.   Ct Renal Stone Study  Result Date: 06/26/2017 CLINICAL DATA:  Flank pain. EXAM: CT ABDOMEN AND PELVIS WITHOUT CONTRAST TECHNIQUE: Multidetector CT imaging of the abdomen and pelvis was performed following the standard protocol without IV contrast. COMPARISON:  CT 05/03/2017 FINDINGS: Lower chest: No consolidation or pleural fluid. Minimal hypoventilatory changes in the lung bases. Hepatobiliary: Decreased hepatic density consistent with steatosis. No focal lesion allowing for lack contrast. Gallbladder physiologically distended, no calcified stone. No biliary dilatation. Pancreas: No ductal dilatation or inflammation. Spleen: Normal in size without focal abnormality. Adrenals/Urinary Tract: No adrenal nodule. Obstructing 9 x 11 mm stone in the right proximal ureter (at the level of L3-L4) with moderate proximal hydroureteronephrosis and perinephric edema. There is a 6 x 7 mm stone at the right ureterovesicular junction. No left hydronephrosis. No left urolithiasis. Urinary bladder is physiologically distended without wall thickening. Stomach/Bowel: Stomach is nondistended. No small bowel inflammation, obstruction or wall thickening. Mild wall thickening of the ascending colon adjacent to  renal inflammation likely reactive. Small to moderate colonic stool burden. Normal appendix. Vascular/Lymphatic: Mild aorta bi-iliac atherosclerosis. Retroaortic left renal vein. No bulky abdominal or pelvic adenopathy. Small calcified density in the pelvis likely calcified lymph node. Reproductive: Uterus and bilateral adnexa are unremarkable. Small amount of free fluid in the pelvis may be physiologic or reactive. Other: No free air or intra-abdominal abscess. Musculoskeletal: Mild facet arthropathy at L5-S1. There are no acute or suspicious osseous abnormalities. IMPRESSION: 1. Obstructing 9 x 11 mm stone in the right proximal ureter with moderate hydroureteronephrosis and perinephric edema. There is an additional 6 x 7 stone at the right ureterovesicular junction. 2. Hepatic steatosis. 3.  Aortic Atherosclerosis (ICD10-I70.0). Electronically Signed   By: Jeb Levering M.D.   On: 06/26/2017 06:30    Subjective: No complains  Discharge Exam: Vitals:   06/28/17 2054 06/29/17 0543  BP: 135/89 (!) 144/70  Pulse: 77 78  Resp: 18 18  Temp: 98.1 F (36.7 C) 98.1 F (36.7 C)  SpO2: 98% 98%   Vitals:   06/28/17 0406 06/28/17 1251 06/28/17 2054 06/29/17 0543  BP: (!) 143/81 (!) 157/76 135/89 (!) 144/70  Pulse: 82  77 78  Resp: 18 18 18 18   Temp: 99.5 F (37.5 C) 98.6 F (37 C) 98.1 F (36.7 C) 98.1 F (36.7 C)  TempSrc: Oral Oral Oral Oral  SpO2: 93% 97% 98% 98%  Weight:      Height:        General: Pt is alert, awake, not in acute  distress Cardiovascular: RRR, S1/S2 +, no rubs, no gallops Respiratory: CTA bilaterally, no wheezing, no rhonchi Abdominal: Soft, NT, ND, bowel sounds + Extremities: no edema, no cyanosis    The results of significant diagnostics from this hospitalization (including imaging, microbiology, ancillary and laboratory) are listed below for reference.     Microbiology: Recent Results (from the past 240 hour(s))  Blood Culture (routine x 2)     Status:  None (Preliminary result)   Collection Time: 06/26/17  6:49 AM  Result Value Ref Range Status   Specimen Description   Final    BLOOD RIGHT ANTECUBITAL Performed at [facility] [address], [place], [place] [address]    Special Requests   Final    BOTTLES DRAWN AEROBIC AND ANAEROBIC Blood Culture adequate volume Performed at [facility] [address], [place], [place] [address]    Culture   Final    NO GROWTH 2 DAYS Performed at [facility] [address] [place], [place] [address]    Report Status PENDING  Incomplete  Blood Culture (routine x 2)     Status: None (Preliminary result)   Collection Time: 06/26/17 10:48 AM  Result Value Ref Range Status   Specimen Description   Final    BLOOD LEFT HAND Performed at [facility] [address], [place], [place] [address]    Special Requests   Final    IN PEDIATRIC BOTTLE Blood Culture adequate volume Performed at [facility] [address], [place], [place] [address]    Culture   Final    NO GROWTH 2 DAYS Performed at [facility] [address] [place], [place] [address]    Report Status PENDING  Incomplete     Labs: BNP (last 3 results) No results for input(s): BNP in the last 8760 hours. Basic Metabolic Panel: Recent Labs  Lab 06/23/17 0046 06/25/17 2006 06/27/17 0410  NA 138 133* 141  K 3.4* 3.3* 4.1  CL 103 100* 110  CO2 24 20* 24  GLUCOSE 124* 94 171*  BUN 10 10 12   CREATININE 0.93 1.04* 0.87  CALCIUM 9.3 8.9 8.9   Liver Function Tests: Recent Labs  Lab 06/23/17 0046 06/25/17 2006  AST 20 33  ALT 21 43  ALKPHOS 86 84  BILITOT 0.6 2.3*  PROT 7.3 7.8  ALBUMIN 4.1 4.1   Recent Labs  Lab 06/23/17 0046  LIPASE 21   No results for input(s): AMMONIA in the last 168 hours. CBC: Recent Labs  Lab 06/23/17 0046 06/25/17 2006 06/27/17 0410  WBC 13.0* 18.1* 16.5*  NEUTROABS  --  14.1*  --   HGB  12.0 11.3* 10.2*  HCT 36.3 33.6* 31.5*  MCV 94.5 93.1 94.9  PLT 367 301 318   Cardiac Enzymes: No results for input(s): CKTOTAL, CKMB, CKMBINDEX, TROPONINI in the last 168 hours. BNP: Invalid input(s): POCBNP CBG: No results for input(s): GLUCAP in the last 168 hours. D-Dimer No results for input(s): DDIMER in the last 72 hours. Hgb A1c No results for input(s): HGBA1C in the last 72 hours. Lipid Profile No results for input(s): CHOL, HDL, LDLCALC, TRIG, CHOLHDL, LDLDIRECT in the last 72 hours. Thyroid function studies No results for input(s): TSH, T4TOTAL, T3FREE, THYROIDAB in the last 72 hours.  Invalid input(s): FREET3 Anemia work up No results for input(s): VITAMINB12, FOLATE, FERRITIN, TIBC, IRON, RETICCTPCT in the last 72 hours. Urinalysis    Component Value Date/Time  COLORURINE YELLOW 06/26/2017 0321   APPEARANCEUR CLOUDY (A) 06/26/2017 0321   LABSPEC 1.013 06/26/2017 0321   PHURINE 6.0 06/26/2017 0321   GLUCOSEU NEGATIVE 06/26/2017 0321   HGBUR LARGE (A) 06/26/2017 0321   BILIRUBINUR NEGATIVE 06/26/2017 0321   KETONESUR 80 (A) 06/26/2017 0321   PROTEINUR 100 (A) 06/26/2017 0321   UROBILINOGEN 0.2 02/13/2014 1308   NITRITE NEGATIVE 06/26/2017 0321   LEUKOCYTESUR LARGE (A) 06/26/2017 0321   Sepsis Labs Invalid input(s): PROCALCITONIN,  WBC,  LACTICIDVEN Microbiology Recent Results (from the past 240 hour(s))  Blood Culture (routine x 2)     Status: None (Preliminary result)   Collection Time: 06/26/17  6:49 AM  Result Value Ref Range Status   Specimen Description   Final    BLOOD RIGHT ANTECUBITAL Performed at [facility], [place] [address] [place], [place] [address]    Special Requests   Final    BOTTLES DRAWN AEROBIC AND ANAEROBIC Blood Culture adequate volume Performed at [facility] [address] [place], [place] [address]    Culture   Final    NO GROWTH 2 DAYS Performed at [facility] [address] [place], [place] [address]    Report Status PENDING  Incomplete  Blood Culture (routine x 2)     Status: None (Preliminary result)   Collection Time: 06/26/17 10:48 AM  Result Value Ref Range Status   Specimen Description   Final    BLOOD LEFT HAND Performed at [facility] [address] [place], [place] [address]    Special Requests   Final    IN PEDIATRIC BOTTLE Blood Culture adequate volume Performed at [facility] [address], [place], [place] [address]    Culture   Final    NO GROWTH 2 DAYS Performed at [facility] [address] [place], [place] [address]    Report Status PENDING  Incomplete     Time coordinating discharge: Over 30 minutes  SIGNED:   Charlynne Cousins, MD  [facility] 06/29/2017, 9:26 AM Pager   If 7PM-7AM, please contact night-coverage [email or web address] Password [email or web address]

## 2017-06-30 ENCOUNTER — Emergency Department (HOSPITAL_COMMUNITY): Payer: Managed Care, Other (non HMO)

## 2017-06-30 ENCOUNTER — Encounter (HOSPITAL_COMMUNITY): Payer: Self-pay | Admitting: Emergency Medicine

## 2017-06-30 ENCOUNTER — Inpatient Hospital Stay (HOSPITAL_COMMUNITY): Payer: Managed Care, Other (non HMO)

## 2017-06-30 ENCOUNTER — Observation Stay (HOSPITAL_COMMUNITY)
Admission: EM | Admit: 2017-06-30 | Discharge: 2017-07-01 | Disposition: A | Discharge status: Home / Self Care | Payer: Managed Care, Other (non HMO) | Attending: Internal Medicine | Admitting: Internal Medicine

## 2017-06-30 ENCOUNTER — Other Ambulatory Visit: Payer: Self-pay

## 2017-06-30 DIAGNOSIS — A419 Sepsis, unspecified organism: Principal | ICD-10-CM | POA: Diagnosis present

## 2017-06-30 DIAGNOSIS — E876 Hypokalemia: Secondary | ICD-10-CM | POA: Insufficient documentation

## 2017-06-30 DIAGNOSIS — N136 Pyonephrosis: Secondary | ICD-10-CM

## 2017-06-30 DIAGNOSIS — D352 Benign neoplasm of pituitary gland: Secondary | ICD-10-CM | POA: Diagnosis not present

## 2017-06-30 DIAGNOSIS — N12 Tubulo-interstitial nephritis, not specified as acute or chronic: Secondary | ICD-10-CM | POA: Diagnosis present

## 2017-06-30 DIAGNOSIS — N16 Renal tubulo-interstitial disorders in diseases classified elsewhere: Secondary | ICD-10-CM | POA: Diagnosis not present

## 2017-06-30 DIAGNOSIS — N200 Calculus of kidney: Secondary | ICD-10-CM | POA: Insufficient documentation

## 2017-06-30 DIAGNOSIS — N390 Urinary tract infection, site not specified: Secondary | ICD-10-CM

## 2017-06-30 DIAGNOSIS — I10 Essential (primary) hypertension: Secondary | ICD-10-CM | POA: Diagnosis not present

## 2017-06-30 DIAGNOSIS — R109 Unspecified abdominal pain: Secondary | ICD-10-CM | POA: Diagnosis present

## 2017-06-30 DIAGNOSIS — R319 Hematuria, unspecified: Secondary | ICD-10-CM

## 2017-06-30 LAB — COMPREHENSIVE METABOLIC PANEL
ALBUMIN: 3.7 g/dL (ref 3.5–5.0)
ALT: 41 U/L (ref 14–54)
ANION GAP: 14 (ref 5–15)
AST: 38 U/L (ref 15–41)
Alkaline Phosphatase: 77 U/L (ref 38–126)
BUN: 12 mg/dL (ref 6–20)
CHLORIDE: 101 mmol/L (ref 101–111)
CO2: 24 mmol/L (ref 22–32)
Calcium: 9.5 mg/dL (ref 8.9–10.3)
Creatinine, Ser: 0.95 mg/dL (ref 0.44–1.00)
GFR calc Af Amer: >60 mL/min (ref >60)
GFR calc non Af Amer: >60 mL/min (ref >60)
GLUCOSE: 140 mg/dL — AB (ref 65–99)
POTASSIUM: 3.1 mmol/L — AB (ref 3.5–5.1)
Sodium: 139 mmol/L (ref 135–145)
Total Bilirubin: 0.4 mg/dL (ref 0.3–1.2)
Total Protein: 7.3 g/dL (ref 6.5–8.1)

## 2017-06-30 LAB — URINALYSIS, ROUTINE W REFLEX MICROSCOPIC
Bilirubin Urine: NEGATIVE
Glucose, UA: NEGATIVE mg/dL
KETONES UR: NEGATIVE mg/dL
Nitrite: NEGATIVE
PH: 6 (ref 5.0–8.0)
Protein, ur: NEGATIVE mg/dL
SPECIFIC GRAVITY, URINE: 1.01 (ref 1.005–1.030)

## 2017-06-30 LAB — I-STAT BETA HCG BLOOD, ED (MC, WL, AP ONLY)

## 2017-06-30 LAB — CBC WITH DIFFERENTIAL/PLATELET
BASOS ABS: 0 10*3/uL (ref 0.0–0.1)
Basophils Relative: 0 %
Eosinophils Absolute: 0.1 10*3/uL (ref 0.0–0.7)
Eosinophils Relative: 1 %
HEMATOCRIT: 37.5 % (ref 36.0–46.0)
Hemoglobin: 12.7 g/dL (ref 12.0–15.0)
LYMPHS ABS: 0.8 10*3/uL (ref 0.7–4.0)
LYMPHS PCT: 6 %
MCH: 31.5 pg (ref 26.0–34.0)
MCHC: 33.9 g/dL (ref 30.0–36.0)
MCV: 93.1 fL (ref 78.0–100.0)
MONO ABS: 0.4 10*3/uL (ref 0.1–1.0)
MONOS PCT: 2 %
NEUTROS ABS: 13.9 10*3/uL — AB (ref 1.7–7.7)
Neutrophils Relative %: 91 %
Platelets: 343 10*3/uL (ref 150–400)
RBC: 4.03 MIL/uL (ref 3.87–5.11)
RDW: 13.1 % (ref 11.5–15.5)
WBC: 15.2 10*3/uL — ABNORMAL HIGH (ref 4.0–10.5)

## 2017-06-30 LAB — I-STAT CG4 LACTIC ACID, ED: Lactic Acid, Venous: 1.87 mmol/L (ref 0.5–1.9)

## 2017-06-30 MED ORDER — IBUPROFEN 800 MG PO TABS
800.0000 mg | ORAL_TABLET | Freq: Three times a day (TID) | ORAL | Status: DC | PRN
  Administered 2017-06-30: 800 mg via ORAL
  Filled 2017-06-30: qty 1

## 2017-06-30 MED ORDER — ALBUTEROL SULFATE (2.5 MG/3ML) 0.083% IN NEBU: 3.0000 mL | INHALATION_SOLUTION | Freq: Four times a day (QID) | RESPIRATORY_TRACT | Status: DC | PRN

## 2017-06-30 MED ORDER — POTASSIUM CHLORIDE CRYS ER 20 MEQ PO TBCR
40.0000 meq | EXTENDED_RELEASE_TABLET | Freq: Once | ORAL | Status: AC
  Administered 2017-06-30: 40 meq via ORAL
  Filled 2017-06-30: qty 2

## 2017-06-30 MED ORDER — ASPIRIN-ACETAMINOPHEN-CAFFEINE 250-250-65 MG PO TABS
1.0000 | ORAL_TABLET | Freq: Four times a day (QID) | ORAL | Status: DC | PRN
  Administered 2017-06-30: 2 via ORAL
  Filled 2017-06-30 (×3): qty 2

## 2017-06-30 MED ORDER — ONDANSETRON HCL 4 MG PO TABS: 4.0000 mg | ORAL_TABLET | Freq: Four times a day (QID) | ORAL | Status: DC | PRN

## 2017-06-30 MED ORDER — SODIUM CHLORIDE 0.9 % IV SOLN: 1.0000 g | Freq: Every day | INTRAVENOUS | Status: DC

## 2017-06-30 MED ORDER — DIPHENHYDRAMINE HCL 25 MG PO CAPS
25.0000 mg | ORAL_CAPSULE | Freq: Four times a day (QID) | ORAL | Status: DC | PRN
  Administered 2017-06-30: 25 mg via ORAL
  Filled 2017-06-30: qty 1

## 2017-06-30 MED ORDER — SODIUM CHLORIDE 0.9 % IV SOLN
1.0000 g | Freq: Every day | INTRAVENOUS | Status: DC
  Administered 2017-06-30: 1 g via INTRAVENOUS
  Filled 2017-06-30: qty 1

## 2017-06-30 MED ORDER — SODIUM CHLORIDE 0.9 % IV SOLN
INTRAVENOUS | Status: DC
  Administered 2017-06-30: 05:00:00 via INTRAVENOUS

## 2017-06-30 MED ORDER — POTASSIUM CHLORIDE CRYS ER 20 MEQ PO TBCR
40.0000 meq | EXTENDED_RELEASE_TABLET | Freq: Two times a day (BID) | ORAL | Status: AC
Start: 2017-06-30 — End: 2017-06-30
  Administered 2017-06-30 (×2): 40 meq via ORAL
  Filled 2017-06-30 (×2): qty 2

## 2017-06-30 MED ORDER — MORPHINE SULFATE (PF) 4 MG/ML IV SOLN
4.0000 mg | Freq: Once | INTRAVENOUS | Status: AC
  Administered 2017-06-30: 4 mg via INTRAVENOUS
  Filled 2017-06-30: qty 1

## 2017-06-30 MED ORDER — ACETAMINOPHEN 650 MG RE SUPP: 650.0000 mg | Freq: Four times a day (QID) | RECTAL | Status: DC | PRN

## 2017-06-30 MED ORDER — ONDANSETRON HCL 4 MG/2ML IJ SOLN: 4.0000 mg | Freq: Four times a day (QID) | INTRAMUSCULAR | Status: DC | PRN

## 2017-06-30 MED ORDER — SODIUM CHLORIDE 0.9 % IV BOLUS (SEPSIS)
1000.0000 mL | Freq: Once | INTRAVENOUS | Status: AC
  Administered 2017-06-30: 1000 mL via INTRAVENOUS

## 2017-06-30 MED ORDER — PANTOPRAZOLE SODIUM 40 MG PO TBEC
40.0000 mg | DELAYED_RELEASE_TABLET | Freq: Two times a day (BID) | ORAL | Status: DC
  Administered 2017-06-30 – 2017-07-01 (×3): 40 mg via ORAL
  Filled 2017-06-30 (×3): qty 1

## 2017-06-30 MED ORDER — SODIUM CHLORIDE 0.9 % IV SOLN
1.0000 g | Freq: Once | INTRAVENOUS | Status: AC
  Administered 2017-06-30: 1 g via INTRAVENOUS
  Filled 2017-06-30: qty 10

## 2017-06-30 MED ORDER — ACETAMINOPHEN 325 MG PO TABS: 650.0000 mg | ORAL_TABLET | Freq: Four times a day (QID) | ORAL | Status: DC | PRN

## 2017-06-30 MED ORDER — CABERGOLINE 0.5 MG PO TABS: 0.2500 mg | ORAL_TABLET | ORAL | Status: DC

## 2017-06-30 MED ORDER — HYDROCODONE-ACETAMINOPHEN 5-325 MG PO TABS
1.0000 | ORAL_TABLET | ORAL | Status: DC | PRN
  Administered 2017-06-30 – 2017-07-01 (×5): 2 via ORAL
  Filled 2017-06-30 (×6): qty 2

## 2017-06-30 MED ORDER — ENOXAPARIN SODIUM 40 MG/0.4ML ~~LOC~~ SOLN
40.0000 mg | SUBCUTANEOUS | Status: DC
  Filled 2017-06-30 (×2): qty 0.4

## 2017-06-30 MED ORDER — ONDANSETRON HCL 4 MG/2ML IJ SOLN
4.0000 mg | Freq: Once | INTRAMUSCULAR | Status: AC
  Administered 2017-06-30: 4 mg via INTRAVENOUS
  Filled 2017-06-30: qty 2

## 2017-06-30 MED ORDER — SODIUM CHLORIDE 0.9 % IV SOLN
INTRAVENOUS | Status: AC
  Administered 2017-06-30 (×2): via INTRAVENOUS

## 2017-06-30 MED ORDER — ONDANSETRON 4 MG PO TBDP: 4.0000 mg | ORAL_TABLET | Freq: Three times a day (TID) | ORAL | Status: DC | PRN

## 2017-06-30 NOTE — ED Notes (Signed)
Meal tray provided.

## 2017-06-30 NOTE — ED Notes (Addendum)
Patient is refusing blood draw at this time or any kind of IV/needle stick. Patient stating she is in pain from previous stick and wants to wait until pain has calmed down. RN made aware.

## 2017-06-30 NOTE — Progress Notes (Signed)
[facility] PROGRESS NOTE    Progress Note  Tracy Johns  ZJQ:[id number] DOB: [birth year] DOA: 06/30/2017 PCP: Clent Demark, PA-C     Brief Narrative:   Tracy Johns is an 42 y.o. female past medical history of essential hypertension, nephrolithiasis with a recent discharge from [facility] long status post stenting and treated for [place] was discharged on Bactrim comes back to the hospital for fevers, urine culture will probably be unreliable  Assessment/Plan:   Sepsis due to acute Pyelonephritis in the setting of nephrolithiasis: Failed Bactrim continue IV Rocephin urine cultures and blood cultures have been ordered will probably be unreliable. Renal ultrasound showed improved hydronephrosis. She could probably be discharged on Vantin.  Hypokalemia Repleted orally, check in the morning.  Essential hypertension Hold antihypertensive medication can probably resume in the morning.    DVT prophylaxis: lovenox Family Communication:husband Disposition Plan/Barrier to D/C: home in 3 days Code Status:     Code Status Orders  (From admission, onward)        Start     Ordered   06/26/17 0732  Full code  Continuous     06/26/17 0735    Code Status History    Date Active Date Inactive Code Status Order ID Comments User Context   This patient has a current code status but no historical code status.        IV Access:    Peripheral IV   Procedures and diagnostic studies:   Dg Chest 2 View  Result Date: 06/30/2017 CLINICAL DATA:  Kidney stone with fever EXAM: CHEST  2 VIEW COMPARISON:  06/25/2017 FINDINGS: The heart size and mediastinal contours are within normal limits. Both lungs are clear. The visualized skeletal structures are unremarkable. Partially visualized stent in the upper abdomen on lateral view. IMPRESSION: No active cardiopulmonary disease. Electronically Signed   By: Donavan Foil M.D.   On: 06/30/2017 01:27   Dg Abdomen 1  View  Result Date: 06/30/2017 CLINICAL DATA:  Stent placed for stones EXAM: ABDOMEN - 1 VIEW COMPARISON:  CT 06/26/2017, radiograph 05/15/2017 FINDINGS: Nonobstructed bowel-gas pattern with moderate stool. Interim placement of right-sided ureteral stent. 11 mm stone adjacent to the proximal stent at the junction of the first and middle thirds. Additional stone adjacent to the pigtail at the bladder. IMPRESSION: 1. Right-sided ureteral stent with 11 mm stone near the proximal stent and 6 mm stone in the right pelvis. Position appears similar compared to the recent CT. 2. Nonobstructed gas pattern Electronically Signed   By: Donavan Foil M.D.   On: 06/30/2017 01:56   US Renal  Result Date: 06/30/2017 CLINICAL DATA:  Pyohydronephrosis. EXAM: RENAL / URINARY TRACT ULTRASOUND COMPLETE COMPARISON:  Radiographs earlier this day.  CT 06/26/2017 FINDINGS: Right Kidney: Length: 9.8 cm. Previous hydronephrosis has diminished from prior CT. Ureteral stent tentatively visualized in the lower calyx. No shadowing calculi. Left Kidney: Length: 10.0 cm. Echogenicity within normal limits. No mass or hydronephrosis visualized. Bladder: Appears normal for degree of bladder distention. Previous stone at the ureterovesicular junction is not visualized. IMPRESSION: 1. Decreased right hydronephrosis from prior exam. 2. No left hydronephrosis. Electronically Signed   By: Jeb Levering M.D.   On: 06/30/2017 06:23     Medical Consultants:    None.  Anti-Infectives:   IV rocephin  Subjective:    Tracy Johns relates she vomited several times she relates she still having fevers and sweating..  Objective:    Vitals:   06/30/17 2409 06/30/17 7353 06/30/17 0650 06/30/17  0730  BP: 114/83 135/83 127/78 118/74  Pulse: (!) 114 99 99 (!) 118  Resp: 19 15 16 19   Temp: (!) 102.3 F (39.1 C)  (!) 103 F (39.4 C) 100.2 F (37.9 C)  TempSrc: Oral  Oral Oral  SpO2: 96% 98% 94% 94%  Weight:      Height:         Intake/Output Summary (Last 24 hours) at 06/30/2017 0752 Last data filed at 06/30/2017 0419 Gross per 24 hour  Intake 1100 ml  Output -  Net 1100 ml   Filed Weights   06/30/17 0101  Weight: 84.8 kg (187 lb)    Exam: General exam: In no acute distress. Respiratory system: Good air movement and clear to auscultation. Cardiovascular system: S1 & S2 heard, RRR. Gastrointestinal system: Abdomen is nondistended, soft and negative CVA tenderness. Central nervous system: Alert and oriented. No focal neurological deficits. Extremities: No pedal edema. Skin: No rashes, lesions or ulcers Psychiatry: Judgement and insight appear normal. Mood & affect appropriate.    Data Reviewed:    Labs: Basic Metabolic Panel: Recent Labs  Lab 06/25/17 2006 06/27/17 0410 06/30/17 0322  NA 133* 141 139  K 3.3* 4.1 3.1*  CL 100* 110 101  CO2 20* 24 24  GLUCOSE 94 171* 140*  BUN 10 12 12   CREATININE 1.04* 0.87 0.95  CALCIUM 8.9 8.9 9.5   GFR Estimated Creatinine Clearance: 76.2 mL/min (by C-G formula based on SCr of 0.95 mg/dL). Liver Function Tests: Recent Labs  Lab 06/25/17 2006 06/30/17 0322  AST 33 38  ALT 43 41  ALKPHOS 84 77  BILITOT 2.3* 0.4  PROT 7.8 7.3  ALBUMIN 4.1 3.7   No results for input(s): LIPASE, AMYLASE in the last 168 hours. No results for input(s): AMMONIA in the last 168 hours. Coagulation profile No results for input(s): INR, PROTIME in the last 168 hours.  CBC: Recent Labs  Lab 06/25/17 2006 06/27/17 0410 06/30/17 0322  WBC 18.1* 16.5* 15.2*  NEUTROABS 14.1*  --  13.9*  HGB 11.3* 10.2* 12.7  HCT 33.6* 31.5* 37.5  MCV 93.1 94.9 93.1  PLT 301 318 343   Cardiac Enzymes: No results for input(s): CKTOTAL, CKMB, CKMBINDEX, TROPONINI in the last 168 hours. BNP (last 3 results) No results for input(s): PROBNP in the last 8760 hours. CBG: No results for input(s): GLUCAP in the last 168 hours. D-Dimer: No results for input(s): DDIMER in the last 72  hours. Hgb A1c: No results for input(s): HGBA1C in the last 72 hours. Lipid Profile: No results for input(s): CHOL, HDL, LDLCALC, TRIG, CHOLHDL, LDLDIRECT in the last 72 hours. Thyroid function studies: No results for input(s): TSH, T4TOTAL, T3FREE, THYROIDAB in the last 72 hours.  Invalid input(s): FREET3 Anemia work up: No results for input(s): VITAMINB12, FOLATE, FERRITIN, TIBC, IRON, RETICCTPCT in the last 72 hours. Sepsis Labs: Recent Labs  Lab 06/25/17 2006 06/25/17 2020 06/27/17 0410 06/30/17 0322 06/30/17 0335  WBC 18.1*  --  16.5* 15.2*  --   LATICACIDVEN  --  0.56  --   --  1.87   Microbiology Recent Results (from the past 240 hour(s))  Blood Culture (routine x 2)     Status: None (Preliminary result)   Collection Time: 06/26/17  6:49 AM  Result Value Ref Range Status   Specimen Description   Final    BLOOD RIGHT ANTECUBITAL Performed at [facility] [address], [place], [place] [address]    Special Requests  Final    BOTTLES DRAWN AEROBIC AND ANAEROBIC Blood Culture adequate volume Performed at [facility] [address], [place], [place] [address]    Culture   Final    NO GROWTH 3 DAYS Performed at [facility], [place] [address], [place], [place] [address]    Report Status PENDING  Incomplete  Blood Culture (routine x 2)     Status: None (Preliminary result)   Collection Time: 06/26/17 10:48 AM  Result Value Ref Range Status   Specimen Description   Final    BLOOD LEFT HAND Performed at [facility] [address] [place] [address], [place], [place] [address]    Special Requests   Final    IN PEDIATRIC BOTTLE Blood Culture adequate volume Performed at [facility] [address], [place], [place] [address]    Culture   Final    NO GROWTH 3 DAYS Performed at [facility], [place] [address], [place], [place] [address]    Report Status PENDING  Incomplete     Medications:    . [START ON 07/05/2017] cabergoline  0.25 mg Oral Q Sun  . enoxaparin (LOVENOX) injection  40 mg Subcutaneous Q24H   Continuous Infusions: . sodium chloride 125 mL/hr at 06/30/17 0508  . cefTRIAXone (ROCEPHIN)  IV        LOS: 0 days   Charlynne Cousins  [facility] Pager [phone]  *Please refer to [email or web address], password [email or web address] to get updated schedule on who will round on this patient, as hospitalists switch teams weekly. If 7PM-7AM, please contact night-coverage at [email or web address], password [email or web address] for any overnight needs.  06/30/2017, 7:52 AM

## 2017-06-30 NOTE — ED Triage Notes (Signed)
PT presents by EMS for evaluation of abdominal pain that started yesterday around 3pm after being discharged from having stent placement for kidney stone in right side. EMS reports that pt had vomiting prior to their arrival and was given 185mcg Fentanyl IVP and 4mg  Zofran IVP.

## 2017-06-30 NOTE — ED Provider Notes (Signed)
[facility] DEPT Provider Note   CSN: [id number] Arrival date & time: 06/30/17  0045     History   Chief Complaint Chief Complaint  Patient presents with  . Abdominal Pain    HPI Tracy Johns is a 42 y.o. female.  The history is provided by the patient.  Abdominal Pain    She has a history of hypertension, hyperlipidemia, seizures, prolactinoma and was just discharged earlier today following placement of right ureteral stent for ureteral calculi with infection and sepsis.  She was discharged with prescription for trimethoprim-sulfamethoxazole.  She was doing well until about 11 PM when she developed nausea and pain in the right flank and right lower quadrant.  She had chills but was not aware of any fever.  She did not have any sweats.  She was not able to take any of her pain medication because of the nausea, and came to the ED.  Past Medical History:  Diagnosis Date  . Hypercholesterolemia   . Hypertension   . Migraines   . Seizures ([place])    r/t biopsy    Patient Active Problem List   Diagnosis Date Noted  . Nephrolithiasis 06/27/2017  . Hypokalemia 06/27/2017  . Hyponatremia 06/27/2017  . Essential hypertension 06/27/2017  . Severe sepsis ([place]) 06/27/2017  . Pyelonephritis 06/26/2017  . Empty sella ([place]) 04/06/2017  . Prolactinoma ([place]) 04/06/2017  . High serum adrenocorticotropic hormone (ACTH) 04/06/2017  . Hyperprolactinemia ([place]) 10/02/2016    Past Surgical History:  Procedure Laterality Date  . CESAREAN SECTION    . CYSTOSCOPY/RETROGRADE/URETEROSCOPY Right 06/26/2017   Procedure: CYSTOSCOPY RIGHT RETROGRADE RIGHT CYSTOSCOPY;  Surgeon: Festus Aloe, MD;  Location: [facility];  Service: Urology;  Laterality: Right;  . TUBAL LIGATION      OB History    No data available       Home Medications    Prior to Admission medications   Medication Sig Start Date End Date Taking? Authorizing Provider  albuterol  (VENTOLIN HFA) 108 (90 Base) MCG/ACT inhaler Inhale 2 puffs into the lungs every 6 (six) hours as needed for wheezing or shortness of breath.    [provider]  aspirin-acetaminophen-caffeine (EXCEDRIN MIGRAINE) [phone] MG tablet Take 1-2 tablets by mouth every 6 (six) hours as needed for headache.    [provider]  cabergoline (DOSTINEX) 0.5 MG tablet Take 0.5 tablets (0.25 mg total) by mouth once a week. Patient taking differently: Take 0.25 mg by mouth every Sunday.  04/06/17   Philemon Kingdom, MD  hydrochlorothiazide (HYDRODIURIL) 12.5 MG tablet Take 1 tablet (12.5 mg total) by mouth daily. Patient not taking: Reported on 06/26/2017 07/10/16   Clent Demark, PA-C  HYDROcodone-acetaminophen (NORCO/VICODIN) 5-325 MG tablet Take 1-2 tablets by mouth every 4 (four) hours as needed for moderate pain. Patient not taking: Reported on 06/26/2017 05/15/17   Orpah Greek, MD  ibuprofen (ADVIL,MOTRIN) 800 MG tablet Take 1 tablet (800 mg total) by mouth every 8 (eight) hours as needed for moderate pain (for headaches). Patient not taking: Reported on 06/26/2017 05/06/17   Domenic Moras, PA-C  lisinopril (PRINIVIL,ZESTRIL) 10 MG tablet Take 1 tablet (10 mg total) by mouth daily. 07/10/16   Clent Demark, PA-C  ondansetron (ZOFRAN ODT) 4 MG disintegrating tablet Take 1 tablet (4 mg total) by mouth every 8 (eight) hours as needed for nausea or vomiting. Patient not taking: Reported on 06/26/2017 05/03/17   Horton, Barbette Hair, MD  ondansetron (ZOFRAN) 4 MG tablet Take 1 tablet (  4 mg total) by mouth every 6 (six) hours. Patient not taking: Reported on 06/26/2017 05/15/17   Orpah Greek, MD  promethazine (PHENERGAN) 25 MG tablet Take 1 tablet (25 mg total) by mouth every 6 (six) hours as needed for nausea. Patient not taking: Reported on 06/26/2017 05/06/17   Domenic Moras, PA-C  sulfamethoxazole-trimethoprim (BACTRIM DS,SEPTRA DS) 800-160 MG tablet Take 1 tablet by mouth 2  (two) times daily for 10 days. 07/10/17 07/20/17  Charlynne Cousins, MD  tamsulosin (FLOMAX) 0.4 MG CAPS capsule Take 1 capsule (0.4 mg total) by mouth daily. Patient not taking: Reported on 06/26/2017 05/03/17   Horton, Barbette Hair, MD    Family History Family History  Problem Relation Age of Onset  . Hypertension Other   . Stroke Other   . Heart failure Other     Social History Social History   Tobacco Use  . Smoking status: Current Every Day Smoker    Packs/day: 0.25    Types: Cigarettes  . Smokeless tobacco: Never Used  Substance Use Topics  . Alcohol use: Yes    Comment: occ  . Drug use: No     Allergies   Imitrex [sumatriptan] and Percocet [oxycodone-acetaminophen]   Review of Systems Review of Systems  Gastrointestinal: Positive for abdominal pain.  All other systems reviewed and are negative.    Physical Exam Updated Vital Signs BP 125/72 (BP Location: Left Arm)   Pulse (!) 112   Temp (!) 102 F (38.9 C) (Oral)   Resp 17   Ht 5\' 1"  (1.549 m)   Wt 84.8 kg (187 lb)   LMP 05/31/2017   SpO2 93%   BMI 35.33 kg/m   Physical Exam  Nursing note and vitals reviewed.  42 year old female, resting comfortably and in no acute distress. Vital signs are significant for fever and tachycardia. Oxygen saturation is 93%, which is normal. Head is normocephalic and atraumatic. PERRLA, EOMI. Oropharynx is clear. Neck is nontender and supple without adenopathy or JVD. Back is nontender in the midline.  There is mild right CVA tenderness. Lungs are clear without rales, wheezes, or rhonchi. Chest is nontender. Heart has regular rate and rhythm without murmur. Abdomen is soft, flat, with mild right lower quadrant tenderness.  There is no rebound or guarding.  There are no masses or hepatosplenomegaly and peristalsis is hypoactive. Extremities have no cyanosis or edema, full range of motion is present. Skin is warm and dry without rash. Neurologic: Mental status is  normal, cranial nerves are intact, there are no motor or sensory deficits.  ED Treatments / Results  Labs (all labs ordered are listed, but only abnormal results are displayed) Labs Reviewed  COMPREHENSIVE METABOLIC PANEL - Abnormal; Notable for the following components:      Result Value   Potassium 3.1 (*)    Glucose, Bld 140 (*)    All other components within normal limits  CBC WITH DIFFERENTIAL/PLATELET - Abnormal; Notable for the following components:   WBC 15.2 (*)    Neutro Abs 13.9 (*)    All other components within normal limits  URINALYSIS, ROUTINE W REFLEX MICROSCOPIC - Abnormal; Notable for the following components:   Color, Urine STRAW (*)    Hgb urine dipstick MODERATE (*)    Leukocytes, UA MODERATE (*)    Bacteria, UA RARE (*)    Squamous Epithelial / LPF 0-5 (*)    All other components within normal limits  CULTURE, BLOOD (ROUTINE X 2)  CULTURE, BLOOD (  ROUTINE X 2)  URINE CULTURE  I-STAT CG4 LACTIC ACID, ED  I-STAT BETA HCG BLOOD, ED ([facility], [facility], [facility] ONLY)  I-STAT CG4 LACTIC ACID, ED   Radiology Dg Chest 2 View  Result Date: 06/30/2017 CLINICAL DATA:  Kidney stone with fever EXAM: CHEST  2 VIEW COMPARISON:  06/25/2017 FINDINGS: The heart size and mediastinal contours are within normal limits. Both lungs are clear. The visualized skeletal structures are unremarkable. Partially visualized stent in the upper abdomen on lateral view. IMPRESSION: No active cardiopulmonary disease. Electronically Signed   By: Donavan Foil M.D.   On: 06/30/2017 01:27   Dg Abdomen 1 View  Result Date: 06/30/2017 CLINICAL DATA:  Stent placed for stones EXAM: ABDOMEN - 1 VIEW COMPARISON:  CT 06/26/2017, radiograph 05/15/2017 FINDINGS: Nonobstructed bowel-gas pattern with moderate stool. Interim placement of right-sided ureteral stent. 11 mm stone adjacent to the proximal stent at the junction of the first and middle thirds. Additional stone adjacent to the pigtail at the bladder. IMPRESSION: 1.  Right-sided ureteral stent with 11 mm stone near the proximal stent and 6 mm stone in the right pelvis. Position appears similar compared to the recent CT. 2. Nonobstructed gas pattern Electronically Signed   By: Donavan Foil M.D.   On: 06/30/2017 01:56    Procedures Procedures (including critical care time)  Medications Ordered in ED Medications  potassium chloride SA (K-DUR,KLOR-CON) CR tablet 40 mEq (not administered)  cefTRIAXone (ROCEPHIN) 1 g in sodium chloride 0.9 % 100 mL IVPB (not administered)  0.9 %  sodium chloride infusion (not administered)  cefTRIAXone (ROCEPHIN) 1 g in sodium chloride 0.9 % 100 mL IVPB (0 g Intravenous Stopped 06/30/17 0419)  ondansetron (ZOFRAN) injection 4 mg (4 mg Intravenous Given 06/30/17 0209)  morphine 4 MG/ML injection 4 mg (4 mg Intravenous Given 06/30/17 0212)  sodium chloride 0.9 % bolus 1,000 mL (0 mLs Intravenous Stopped 06/30/17 0315)     Initial Impression / Assessment and Plan / ED Course  I have reviewed the triage vital signs and the nursing notes.  Pertinent labs & imaging results that were available during my care of the patient were reviewed by me and considered in my medical decision making (see chart for details).  Fever and chills in patient with recent placement of ureteral stent and recent urosepsis.  Old records were reviewed, and antibiotic choice was empiric because cultures did not grow anything because of prior administration of antibiotics (although I note no urine culture).  I suspect that she has ongoing infection by a resistant organism.  Of note, urine culture from January 4 grew Enterobacter aerogenes which was sensitive to both ceftriaxone and trimethoprim-sulfamethoxazole.  Urinalysis is obtained and urine cultures sent and she is started on sepsis protocol.  She is mildly tachycardic, so she is given 1 L of normal saline.  She is started on ceftriaxone.  Will check KUB to make sure stent is in appropriate place.  KUB shows  appropriate placement of the stent.  Laboratory workup is significant for hypokalemia and she is given oral potassium.  Lactic acid level is normal.  Case is discussed with Dr. Alcario Drought of [facility], who agrees to admit the patient.  Final Clinical Impressions(s) / ED Diagnoses   Final diagnoses:  Urinary tract infection with hematuria, site unspecified  Hypokalemia    ED Discharge Orders    None       Delora Fuel, MD 09/81/19 [phone]

## 2017-06-30 NOTE — H&P (Signed)
History and Physical    Tracy Johns UMP:[id number] DOB: [birth year] DOA: 06/30/2017  PCP: Clent Demark, PA-C  Patient coming from: Home  I have personally briefly reviewed patient's old medical records in [place]  Chief Complaint: Abd pain  HPI: Tracy Johns is a 42 y.o. female with medical history significant of HTN, HLD, prolactinoma.  Patient recently admitted to our service and discharged yesterday with UTI, sepsis, pyohydronephrosis secondary to R ureter obstructing stone.  Underwent ureteral stent placement.  Patient improved in hospital on rocephin (last dose given on 2/17 in the evening).  Discharged 2/18 on bactrim.  Was doing well until 11pm when she developed nausea, R flank pain, RLQ pain.   ED Course: UA suspicious for persistent UTI.  WBC 15.2k.  Patient septic with Tm 102, tachycardia 122 improved after 1L NS.  Hospitalist asked to admit.   Review of Systems: As per HPI otherwise 10 point review of systems negative.   Past Medical History:  Diagnosis Date  . Hypercholesterolemia   . Hypertension   . Migraines   . Seizures ([place])    r/t biopsy    Past Surgical History:  Procedure Laterality Date  . CESAREAN SECTION    . CYSTOSCOPY/RETROGRADE/URETEROSCOPY Right 06/26/2017   Procedure: CYSTOSCOPY RIGHT RETROGRADE RIGHT CYSTOSCOPY;  Surgeon: Festus Aloe, MD;  Location: [facility];  Service: Urology;  Laterality: Right;  . TUBAL LIGATION       reports that she has been smoking cigarettes.  She has been smoking about 0.25 packs per day. she has never used smokeless tobacco. She reports that she drinks alcohol. She reports that she does not use drugs.  Allergies  Allergen Reactions  . Imitrex [Sumatriptan] Nausea And Vomiting  . Percocet [Oxycodone-Acetaminophen] Itching and Other (See Comments)    Body feeling like it's "burning," also     Family History  Problem Relation Age of Onset  . Hypertension Other   . Stroke  Other   . Heart failure Other      Prior to Admission medications   Medication Sig Start Date End Date Taking? Authorizing Provider  albuterol (VENTOLIN HFA) 108 (90 Base) MCG/ACT inhaler Inhale 2 puffs into the lungs every 6 (six) hours as needed for wheezing or shortness of breath.   Yes [provider]  aspirin-acetaminophen-caffeine (EXCEDRIN MIGRAINE) [phone] MG tablet Take 1-2 tablets by mouth every 6 (six) hours as needed for headache.   Yes [provider]  cabergoline (DOSTINEX) 0.5 MG tablet Take 0.5 tablets (0.25 mg total) by mouth once a week. Patient taking differently: Take 0.25 mg by mouth every Sunday.  04/06/17  Yes Philemon Kingdom, MD  lisinopril (PRINIVIL,ZESTRIL) 10 MG tablet Take 1 tablet (10 mg total) by mouth daily. 07/10/16  Yes Clent Demark, PA-C  hydrochlorothiazide (HYDRODIURIL) 12.5 MG tablet Take 1 tablet (12.5 mg total) by mouth daily. Patient not taking: Reported on 06/26/2017 07/10/16   Clent Demark, PA-C  HYDROcodone-acetaminophen (NORCO/VICODIN) 5-325 MG tablet Take 1-2 tablets by mouth every 4 (four) hours as needed for moderate pain. Patient not taking: Reported on 06/26/2017 05/15/17   Orpah Greek, MD  ibuprofen (ADVIL,MOTRIN) 800 MG tablet Take 1 tablet (800 mg total) by mouth every 8 (eight) hours as needed for moderate pain (for headaches). Patient not taking: Reported on 06/26/2017 05/06/17   Domenic Moras, PA-C  ondansetron (ZOFRAN ODT) 4 MG disintegrating tablet Take 1 tablet (4 mg total) by mouth every 8 (eight) hours as needed for nausea or  vomiting. Patient not taking: Reported on 06/26/2017 05/03/17   Horton, Barbette Hair, MD  ondansetron (ZOFRAN) 4 MG tablet Take 1 tablet (4 mg total) by mouth every 6 (six) hours. Patient not taking: Reported on 06/26/2017 05/15/17   Orpah Greek, MD  promethazine (PHENERGAN) 25 MG tablet Take 1 tablet (25 mg total) by mouth every 6 (six) hours as needed for nausea. Patient  not taking: Reported on 06/26/2017 05/06/17   Domenic Moras, PA-C  tamsulosin (FLOMAX) 0.4 MG CAPS capsule Take 1 capsule (0.4 mg total) by mouth daily. Patient not taking: Reported on 06/26/2017 05/03/17   Merryl Hacker, MD    Physical Exam: Vitals:   06/30/17 0345 06/30/17 0400 06/30/17 0415 06/30/17 0447  BP: 120/84 133/71 118/74 119/75  Pulse: (!) 101 (!) 105 (!) 101 91  Resp: 20 (!) 21 (!) 22 18  Temp:    (!) 101.2 F (38.4 C)  TempSrc:    Oral  SpO2: 94% 95% 95% 95%  Weight:      Height:        Constitutional: NAD, calm, comfortable Eyes: PERRL, lids and conjunctivae normal ENMT: Mucous membranes are moist. Posterior pharynx clear of any exudate or lesions.Normal dentition.  Neck: normal, supple, no masses, no thyromegaly Respiratory: clear to auscultation bilaterally, no wheezing, no crackles. Normal respiratory effort. No accessory muscle use.  Cardiovascular: Regular rate and rhythm, no murmurs / rubs / gallops. No extremity edema. 2+ pedal pulses. No carotid bruits.  Abdomen: no tenderness, no masses palpated. No hepatosplenomegaly. Bowel sounds positive.  Musculoskeletal: no clubbing / cyanosis. No joint deformity upper and lower extremities. Good ROM, no contractures. Normal muscle tone.  Skin: no rashes, lesions, ulcers. No induration Neurologic: CN 2-12 grossly intact. Sensation intact, DTR normal. Strength 5/5 in all 4.  Psychiatric: Normal judgment and insight. Alert and oriented x 3. Normal mood.    Labs on Admission: I have personally reviewed following labs and imaging studies  CBC: Recent Labs  Lab 06/25/17 2006 06/27/17 0410 06/30/17 0322  WBC 18.1* 16.5* 15.2*  NEUTROABS 14.1*  --  13.9*  HGB 11.3* 10.2* 12.7  HCT 33.6* 31.5* 37.5  MCV 93.1 94.9 93.1  PLT 301 318 035   Basic Metabolic Panel: Recent Labs  Lab 06/25/17 2006 06/27/17 0410 06/30/17 0322  NA 133* 141 139  K 3.3* 4.1 3.1*  CL 100* 110 101  CO2 20* 24 24  GLUCOSE 94 171* 140*    BUN 10 12 12   CREATININE 1.04* 0.87 0.95  CALCIUM 8.9 8.9 9.5   GFR: Estimated Creatinine Clearance: 76.2 mL/min (by C-G formula based on SCr of 0.95 mg/dL). Liver Function Tests: Recent Labs  Lab 06/25/17 2006 06/30/17 0322  AST 33 38  ALT 43 41  ALKPHOS 84 77  BILITOT 2.3* 0.4  PROT 7.8 7.3  ALBUMIN 4.1 3.7   No results for input(s): LIPASE, AMYLASE in the last 168 hours. No results for input(s): AMMONIA in the last 168 hours. Coagulation Profile: No results for input(s): INR, PROTIME in the last 168 hours. Cardiac Enzymes: No results for input(s): CKTOTAL, CKMB, CKMBINDEX, TROPONINI in the last 168 hours. BNP (last 3 results) No results for input(s): PROBNP in the last 8760 hours. HbA1C: No results for input(s): HGBA1C in the last 72 hours. CBG: No results for input(s): GLUCAP in the last 168 hours. Lipid Profile: No results for input(s): CHOL, HDL, LDLCALC, TRIG, CHOLHDL, LDLDIRECT in the last 72 hours. Thyroid Function Tests: No results for input(s):  TSH, T4TOTAL, FREET4, T3FREE, THYROIDAB in the last 72 hours. Anemia Panel: No results for input(s): VITAMINB12, FOLATE, FERRITIN, TIBC, IRON, RETICCTPCT in the last 72 hours. Urine analysis:    Component Value Date/Time   COLORURINE STRAW (A) 06/30/2017 0113   APPEARANCEUR CLEAR 06/30/2017 0113   LABSPEC 1.010 06/30/2017 0113   PHURINE 6.0 06/30/2017 0113   GLUCOSEU NEGATIVE 06/30/2017 0113   HGBUR MODERATE (A) 06/30/2017 0113   BILIRUBINUR NEGATIVE 06/30/2017 0113   KETONESUR NEGATIVE 06/30/2017 0113   PROTEINUR NEGATIVE 06/30/2017 0113   UROBILINOGEN 0.2 02/13/2014 1308   NITRITE NEGATIVE 06/30/2017 0113   LEUKOCYTESUR MODERATE (A) 06/30/2017 0113    Radiological Exams on Admission: Dg Chest 2 View  Result Date: 06/30/2017 CLINICAL DATA:  Kidney stone with fever EXAM: CHEST  2 VIEW COMPARISON:  06/25/2017 FINDINGS: The heart size and mediastinal contours are within normal limits. Both lungs are clear. The  visualized skeletal structures are unremarkable. Partially visualized stent in the upper abdomen on lateral view. IMPRESSION: No active cardiopulmonary disease. Electronically Signed   By: Donavan Foil M.D.   On: 06/30/2017 01:27   Dg Abdomen 1 View  Result Date: 06/30/2017 CLINICAL DATA:  Stent placed for stones EXAM: ABDOMEN - 1 VIEW COMPARISON:  CT 06/26/2017, radiograph 05/15/2017 FINDINGS: Nonobstructed bowel-gas pattern with moderate stool. Interim placement of right-sided ureteral stent. 11 mm stone adjacent to the proximal stent at the junction of the first and middle thirds. Additional stone adjacent to the pigtail at the bladder. IMPRESSION: 1. Right-sided ureteral stent with 11 mm stone near the proximal stent and 6 mm stone in the right pelvis. Position appears similar compared to the recent CT. 2. Nonobstructed gas pattern Electronically Signed   By: Donavan Foil M.D.   On: 06/30/2017 01:56    EKG: Independently reviewed.  Assessment/Plan Principal Problem:   Pyelonephritis Active Problems:   Essential hypertension   Sepsis ([place])    1. Sepsis due to pyelonephritis - 1. Was improving on rocephin during last admit, but failed bactrim when discharged. 2. Will put patient back on rocephin 3. UCx pending 4. IVF: 1L bolus and 125 cc/hr NS 5. Renal US to evaluate that the R ureteral stent is draining still. 2. HTN - 1. Hold home BP meds in setting of sepsis 3. Prolactinoma - continue weekly cabergoline  DVT prophylaxis: Lovenox Code Status: Full Family Communication: Family at bedside Disposition Plan: Home after admit Consults called: None Admission status: Admit to inpatient - IP status for rocephin, failed outpatient bactrim.   Etta Quill DO [facility] Pager [phone]  If 7AM-7PM, please contact day team taking care of patient [email or web address] Password [email or web address]  06/30/2017, 5:14 AM

## 2017-06-30 NOTE — ED Notes (Signed)
Bed: WA16 Expected date:  Expected time:  Means of arrival:  Comments: EMS 

## 2017-06-30 NOTE — ED Notes (Signed)
ED TO INPATIENT HANDOFF REPORT  Name/Age/Gender Tracy Johns 42 y.o. female  Code Status    Code Status Orders  (From admission, onward)        Start     Ordered   06/30/17 0510  Full code  Continuous     06/30/17 0511    Code Status History    Date Active Date Inactive Code Status Order ID Comments User Context   06/26/2017 07:35 06/29/2017 18:41 Full Code [id number]  Kinnie Feil, MD ED      Home/SNF/Other Home  Chief Complaint Abdominal Pain  Level of Care/Admitting Diagnosis ED Disposition    ED Disposition Condition [facility] Area: [facility] [[id number]]  Level of Care: Med-Surg [16]  Diagnosis: Pyelonephritis [440347]  Admitting Physician: Charlynne Cousins [[id number]]  Attending Physician: Charlynne Cousins [[id number]]  Estimated length of stay: past midnight tomorrow  Certification:: I certify this patient will need inpatient services for at least 2 midnights  PT Class (Do Not Modify): Inpatient [101]  PT Acc Code (Do Not Modify): Private [1]       Medical History Past Medical History:  Diagnosis Date  . Hypercholesterolemia   . Hypertension   . Migraines   . Seizures ([place])    r/t biopsy    Allergies Allergies  Allergen Reactions  . Imitrex [Sumatriptan] Nausea And Vomiting  . Percocet [Oxycodone-Acetaminophen] Itching and Other (See Comments)    Body feeling like it's "burning," also     IV Location/Drains/Wounds Patient Lines/Drains/Airways Status   Active Line/Drains/Airways    Name:   Placement date:   Placement time:   Site:   Days:   Peripheral IV 06/26/17 Right Antecubital   06/26/17    0209    Antecubital   4   Peripheral IV Left Hand   -    -    Hand      Ureteral Drain/Stent Right ureter 6 Fr.   06/26/17    0948    Right ureter   4          Labs/Imaging Results for orders placed or performed during the hospital encounter of 06/30/17 (from the past 48 hour(s))  Urinalysis,  Routine w reflex microscopic     Status: Abnormal   Collection Time: 06/30/17  1:13 AM  Result Value Ref Range   Color, Urine STRAW (A) YELLOW   APPearance CLEAR CLEAR   Specific Gravity, Urine 1.010 1.005 - 1.030   pH 6.0 5.0 - 8.0   Glucose, UA NEGATIVE NEGATIVE mg/dL   Hgb urine dipstick MODERATE (A) NEGATIVE   Bilirubin Urine NEGATIVE NEGATIVE   Ketones, ur NEGATIVE NEGATIVE mg/dL   Protein, ur NEGATIVE NEGATIVE mg/dL   Nitrite NEGATIVE NEGATIVE   Leukocytes, UA MODERATE (A) NEGATIVE   RBC / HPF TOO NUMEROUS TO COUNT 0 - 5 RBC/hpf   WBC, UA 6-30 0 - 5 WBC/hpf   Bacteria, UA RARE (A) NONE SEEN   Squamous Epithelial / LPF 0-5 (A) NONE SEEN   Mucus PRESENT     Comment: Performed at [facility], [place] [address] [place], [place] [address]  Comprehensive metabolic panel     Status: Abnormal   Collection Time: 06/30/17  3:22 AM  Result Value Ref Range   Sodium 139 135 - 145 mmol/L   Potassium 3.1 (L) 3.5 - 5.1 mmol/L   Chloride 101 101 - 111 mmol/L   CO2 24 22 - 32 mmol/L  Glucose, Bld 140 (H) 65 - 99 mg/dL   BUN 12 6 - 20 mg/dL   Creatinine, Ser 0.95 0.44 - 1.00 mg/dL   Calcium 9.5 8.9 - 10.3 mg/dL   Total Protein 7.3 6.5 - 8.1 g/dL   Albumin 3.7 3.5 - 5.0 g/dL   AST 38 15 - 41 U/L   ALT 41 14 - 54 U/L   Alkaline Phosphatase 77 38 - 126 U/L   Total Bilirubin 0.4 0.3 - 1.2 mg/dL   GFR calc non Af Amer >60 >60 mL/min   GFR calc Af Amer >60 >60 mL/min    Comment: (NOTE) The eGFR has been calculated using the CKD EPI equation. This calculation has not been validated in all clinical situations. eGFR's persistently <60 mL/min signify possible Chronic Kidney Disease.    Anion gap 14 5 - 15    Comment: Performed at [facility], [place] [address], [place], [place] [address]  CBC with Differential     Status: Abnormal   Collection Time: 06/30/17  3:22 AM  Result Value Ref Range   WBC 15.2 (H) 4.0 - 10.5 K/uL   RBC 4.03 3.87 - 5.11 MIL/uL    Hemoglobin 12.7 12.0 - 15.0 g/dL   HCT 37.5 36.0 - 46.0 %   MCV 93.1 78.0 - 100.0 fL   MCH 31.5 26.0 - 34.0 pg   MCHC 33.9 30.0 - 36.0 g/dL   RDW 13.1 11.5 - 15.5 %   Platelets 343 150 - 400 K/uL   Neutrophils Relative % 91 %   Neutro Abs 13.9 (H) 1.7 - 7.7 K/uL   Lymphocytes Relative 6 %   Lymphs Abs 0.8 0.7 - 4.0 K/uL   Monocytes Relative 2 %   Monocytes Absolute 0.4 0.1 - 1.0 K/uL   Eosinophils Relative 1 %   Eosinophils Absolute 0.1 0.0 - 0.7 K/uL   Basophils Relative 0 %   Basophils Absolute 0.0 0.0 - 0.1 K/uL    Comment: Performed at [facility], [place] [address], [place], [place] [address]  I-Stat beta hCG blood, ED     Status: None   Collection Time: 06/30/17  3:34 AM  Result Value Ref Range   I-stat hCG, quantitative <5.0 <5 mIU/mL   Comment 3            Comment:   GEST. AGE      CONC.  (mIU/mL)   <=1 WEEK        5 - 50     2 WEEKS       50 - 500     3 WEEKS       100 - 10,000     4 WEEKS     1,000 - 30,000        FEMALE AND NON-PREGNANT FEMALE:     LESS THAN 5 mIU/mL   I-Stat CG4 Lactic Acid, ED     Status: None   Collection Time: 06/30/17  3:35 AM  Result Value Ref Range   Lactic Acid, Venous 1.87 0.5 - 1.9 mmol/L   Dg Chest 2 View  Result Date: 06/30/2017 CLINICAL DATA:  Kidney stone with fever EXAM: CHEST  2 VIEW COMPARISON:  06/25/2017 FINDINGS: The heart size and mediastinal contours are within normal limits. Both lungs are clear. The visualized skeletal structures are unremarkable. Partially visualized stent in the upper abdomen on lateral view. IMPRESSION: No active cardiopulmonary disease. Electronically Signed   By: Donavan Foil M.D.   On:  06/30/2017 01:27   Dg Abdomen 1 View  Result Date: 06/30/2017 CLINICAL DATA:  Stent placed for stones EXAM: ABDOMEN - 1 VIEW COMPARISON:  CT 06/26/2017, radiograph 05/15/2017 FINDINGS: Nonobstructed bowel-gas pattern with moderate stool. Interim placement of right-sided ureteral stent. 11 mm stone  adjacent to the proximal stent at the junction of the first and middle thirds. Additional stone adjacent to the pigtail at the bladder. IMPRESSION: 1. Right-sided ureteral stent with 11 mm stone near the proximal stent and 6 mm stone in the right pelvis. Position appears similar compared to the recent CT. 2. Nonobstructed gas pattern Electronically Signed   By: Donavan Foil M.D.   On: 06/30/2017 01:56   US Renal  Result Date: 06/30/2017 CLINICAL DATA:  Pyohydronephrosis. EXAM: RENAL / URINARY TRACT ULTRASOUND COMPLETE COMPARISON:  Radiographs earlier this day.  CT 06/26/2017 FINDINGS: Right Kidney: Length: 9.8 cm. Previous hydronephrosis has diminished from prior CT. Ureteral stent tentatively visualized in the lower calyx. No shadowing calculi. Left Kidney: Length: 10.0 cm. Echogenicity within normal limits. No mass or hydronephrosis visualized. Bladder: Appears normal for degree of bladder distention. Previous stone at the ureterovesicular junction is not visualized. IMPRESSION: 1. Decreased right hydronephrosis from prior exam. 2. No left hydronephrosis. Electronically Signed   By: Jeb Levering M.D.   On: 06/30/2017 06:23    Pending Labs Unresulted Labs (From admission, onward)   Start     Ordered   06/30/17 0136  Culture, blood (routine x 2)  BLOOD CULTURE X 2,   STAT     06/30/17 0135   06/30/17 0136  Urine culture  STAT,   STAT     06/30/17 0135      Vitals/Pain Today's Vitals   06/30/17 1000 06/30/17 1047 06/30/17 1230 06/30/17 1400  BP: 90/64 (!) 91/56 95/61 (!) 94/58  Pulse: 91 (!) 109 65 88  Resp: '19 17 14 '$ (!) 28  Temp:  97.7 F (36.5 C)    TempSrc:  Oral    SpO2: 96% 95% 97% 92%  Weight:      Height:      PainSc:        Isolation Precautions No active isolations  Medications Medications  cabergoline (DOSTINEX) tablet 0.25 mg (not administered)  aspirin-acetaminophen-caffeine (EXCEDRIN MIGRAINE) per tablet 1-2 tablet (not administered)  albuterol (PROVENTIL) (2.5  MG/3ML) 0.083% nebulizer solution 3 mL (not administered)  HYDROcodone-acetaminophen (NORCO/VICODIN) 5-325 MG per tablet 1-2 tablet (2 tablets Oral Given 06/30/17 1045)  ibuprofen (ADVIL,MOTRIN) tablet 800 mg (800 mg Oral Given 06/30/17 0654)  cefTRIAXone (ROCEPHIN) 1 g in sodium chloride 0.9 % 100 mL IVPB (not administered)  acetaminophen (TYLENOL) tablet 650 mg (not administered)    Or  acetaminophen (TYLENOL) suppository 650 mg (not administered)  ondansetron (ZOFRAN) tablet 4 mg (not administered)    Or  ondansetron (ZOFRAN) injection 4 mg (not administered)  enoxaparin (LOVENOX) injection 40 mg (40 mg Subcutaneous Refused 06/30/17 1044)  pantoprazole (PROTONIX) EC tablet 40 mg (40 mg Oral Given 06/30/17 1046)  0.9 %  sodium chloride infusion ( Intravenous New Bag/Given 06/30/17 0800)  potassium chloride SA (K-DUR,KLOR-CON) CR tablet 40 mEq (40 mEq Oral Given 06/30/17 1045)  cefTRIAXone (ROCEPHIN) 1 g in sodium chloride 0.9 % 100 mL IVPB (0 g Intravenous Stopped 06/30/17 0419)  ondansetron (ZOFRAN) injection 4 mg (4 mg Intravenous Given 06/30/17 0209)  morphine 4 MG/ML injection 4 mg (4 mg Intravenous Given 06/30/17 0212)  sodium chloride 0.9 % bolus 1,000 mL (0 mLs Intravenous Stopped 06/30/17 0315)  potassium chloride SA (K-DUR,KLOR-CON) CR tablet 40 mEq (40 mEq Oral Given 06/30/17 0508)    Mobility walks

## 2017-07-01 DIAGNOSIS — I10 Essential (primary) hypertension: Secondary | ICD-10-CM | POA: Diagnosis not present

## 2017-07-01 DIAGNOSIS — N12 Tubulo-interstitial nephritis, not specified as acute or chronic: Secondary | ICD-10-CM | POA: Diagnosis not present

## 2017-07-01 DIAGNOSIS — A419 Sepsis, unspecified organism: Secondary | ICD-10-CM | POA: Diagnosis not present

## 2017-07-01 DIAGNOSIS — E876 Hypokalemia: Secondary | ICD-10-CM | POA: Diagnosis not present

## 2017-07-01 LAB — BASIC METABOLIC PANEL
ANION GAP: 9 (ref 5–15)
BUN: 9 mg/dL (ref 6–20)
CALCIUM: 9 mg/dL (ref 8.9–10.3)
CO2: 25 mmol/L (ref 22–32)
CREATININE: 0.96 mg/dL (ref 0.44–1.00)
Chloride: 104 mmol/L (ref 101–111)
GLUCOSE: 118 mg/dL — AB (ref 65–99)
Potassium: 4.2 mmol/L (ref 3.5–5.1)
Sodium: 138 mmol/L (ref 135–145)

## 2017-07-01 LAB — CULTURE, BLOOD (ROUTINE X 2)
CULTURE: NO GROWTH
CULTURE: NO GROWTH
Special Requests: ADEQUATE
Special Requests: ADEQUATE

## 2017-07-01 LAB — URINE CULTURE: Culture: NO GROWTH

## 2017-07-01 MED ORDER — CEFPODOXIME PROXETIL 200 MG PO TABS
200.0000 mg | ORAL_TABLET | Freq: Two times a day (BID) | ORAL | Status: DC
  Administered 2017-07-01: 200 mg via ORAL
  Filled 2017-07-01: qty 1

## 2017-07-01 MED ORDER — CEFPODOXIME PROXETIL 200 MG PO TABS: 200.0000 mg | ORAL_TABLET | Freq: Two times a day (BID) | ORAL | 0 refills | Status: DC

## 2017-07-01 MED ORDER — TAMSULOSIN HCL 0.4 MG PO CAPS: 0.4000 mg | ORAL_CAPSULE | Freq: Every day | ORAL | 0 refills | Status: DC

## 2017-07-01 MED ORDER — IBUPROFEN 800 MG PO TABS: 800.0000 mg | ORAL_TABLET | Freq: Three times a day (TID) | ORAL | Status: DC | PRN

## 2017-07-01 NOTE — Progress Notes (Signed)
CM consult for medication assistance. Pt has [employer] and will have a co-pay for medication. The hospital is unable to financially assist with medication co-pays. Marney Doctor RN,BSN,NCM [phone]

## 2017-07-01 NOTE — Progress Notes (Signed)
Patient given discharge instructions, and verbalized an understanding of all discharge instructions.  Patient agrees with discharge plan, and is being discharged in stable medical condition.  Patient given transportation via wheelchair. 

## 2017-07-01 NOTE — H&P (Signed)
Physician Discharge Summary  Tracy Johns DJM:[id number] DOB: Sep 09, 1975 DOA: 06/30/2017  PCP: Clent Demark, PA-C  Admit date: 06/30/2017 Discharge date: 07/01/2017  Time spent:40* minutes  Recommendations for Outpatient Follow-up:  1. Follow up PCP in 2 weeks   Discharge Diagnoses:  Principal Problem:   Pyelonephritis Active Problems:   Essential hypertension   Sepsis [facility])   Discharge Condition: Stable  Diet recommendation: Regular diet  Filed Weights   06/30/17 0101  Weight: 84.8 kg (187 lb)    History of present illness:  42 y.o. female with medical history significant of HTN, HLD, prolactinoma.  Patient recently admitted to our service and discharged yesterday with UTI, sepsis, pyohydronephrosis secondary to R ureter obstructing stone.  Underwent ureteral stent placement.  Patient improved in hospital on rocephin (last dose given on 2/17 in the evening).  Discharged 2/18 on bactrim.  Was doing well until 11pm when she developed nausea, R flank pain, RLQ pain.     Hospital Course:   Sepsis due to acute Pyelonephritis in the setting of nephrolithiasis: patient was discharged on Bactrim, came back to the hospital with flank pain, fever, nausea. Urine culture obtained showed contamination. I called and discussed with ID Dr. Megan Salon, will discharge patient on PO Vantin  for three more days.  Hypokalemia replete  Essential hypertension Continue HCTZ 12.5 mg PO daily  Prolactinoma continue weekly Cabergoline.    Procedures:  None   Consultations:  None   Discharge Exam: Vitals:   06/30/17 2145 07/01/17 0554  BP: 134/72 114/64  Pulse: 92 (!) 57  Resp: 18 18  Temp: 98.8 F (37.1 C) 98.5 F (36.9 C)  SpO2: 98% 96%    General: Appears in no acute distress Cardiovascular: S1S2 RRR Respiratory: Clear bilaterally  Discharge Instructions   Discharge Instructions    Diet - low sodium heart healthy   Complete by:  As directed     Increase activity slowly   Complete by:  As directed      Allergies as of 07/01/2017      Reactions   Imitrex [sumatriptan] Nausea And Vomiting   Percocet [oxycodone-acetaminophen] Itching, Other (See Comments)   Body feeling like it's "burning," also      Medication List    STOP taking these medications   aspirin-acetaminophen-caffeine 250-250-65 MG tablet Commonly known as:  EXCEDRIN MIGRAINE   ibuprofen 800 MG tablet Commonly known as:  ADVIL,MOTRIN   ondansetron 4 MG disintegrating tablet Commonly known as:  ZOFRAN ODT   ondansetron 4 MG tablet Commonly known as:  ZOFRAN     TAKE these medications   cabergoline 0.5 MG tablet Commonly known as:  DOSTINEX Take 0.5 tablets (0.25 mg total) by mouth once a week. What changed:  when to take this   cefpodoxime 200 MG tablet Commonly known as:  VANTIN Take 1 tablet (200 mg total) by mouth every 12 (twelve) hours.   hydrochlorothiazide 12.5 MG tablet Commonly known as:  HYDRODIURIL Take 1 tablet (12.5 mg total) by mouth daily.   HYDROcodone-acetaminophen 5-325 MG tablet Commonly known as:  NORCO/VICODIN Take 1-2 tablets by mouth every 4 (four) hours as needed for moderate pain.   lisinopril 10 MG tablet Commonly known as:  PRINIVIL,ZESTRIL Take 1 tablet (10 mg total) by mouth daily.   promethazine 25 MG tablet Commonly known as:  PHENERGAN Take 1 tablet (25 mg total) by mouth every 6 (six) hours as needed for nausea.   tamsulosin 0.4 MG Caps capsule Commonly known as:  FLOMAX  Take 1 capsule (0.4 mg total) by mouth daily.   VENTOLIN HFA 108 (90 Base) MCG/ACT inhaler Generic drug:  albuterol Inhale 2 puffs into the lungs every 6 (six) hours as needed for wheezing or shortness of breath.      Allergies  Allergen Reactions  . Imitrex [Sumatriptan] Nausea And Vomiting  . Percocet [Oxycodone-Acetaminophen] Itching and Other (See Comments)    Body feeling like it's "burning," also       The results of  significant diagnostics from this hospitalization (including imaging, microbiology, ancillary and laboratory) are listed below for reference.    Significant Diagnostic Studies: Dg Chest 2 View  Result Date: 06/30/2017 CLINICAL DATA:  Kidney stone with fever EXAM: CHEST  2 VIEW COMPARISON:  06/25/2017 FINDINGS: The heart size and mediastinal contours are within normal limits. Both lungs are clear. The visualized skeletal structures are unremarkable. Partially visualized stent in the upper abdomen on lateral view. IMPRESSION: No active cardiopulmonary disease. Electronically Signed   By: Donavan Foil M.D.   On: 06/30/2017 01:27   Dg Chest 2 View  Result Date: 06/25/2017 CLINICAL DATA:  Fever, cough and congestion for 48 hours. EXAM: CHEST  2 VIEW COMPARISON:  None. FINDINGS: The cardiac silhouette, mediastinal and hilar contours are within normal limits. The lungs are clear. No infiltrates or effusions. The bony thorax is intact. IMPRESSION: No acute cardiopulmonary findings. Electronically Signed   By: Marijo Sanes M.D.   On: 06/25/2017 20:28   Dg Abdomen 1 View  Result Date: 06/30/2017 CLINICAL DATA:  Stent placed for stones EXAM: ABDOMEN - 1 VIEW COMPARISON:  CT 06/26/2017, radiograph 05/15/2017 FINDINGS: Nonobstructed bowel-gas pattern with moderate stool. Interim placement of right-sided ureteral stent. 11 mm stone adjacent to the proximal stent at the junction of the first and middle thirds. Additional stone adjacent to the pigtail at the bladder. IMPRESSION: 1. Right-sided ureteral stent with 11 mm stone near the proximal stent and 6 mm stone in the right pelvis. Position appears similar compared to the recent CT. 2. Nonobstructed gas pattern Electronically Signed   By: Donavan Foil M.D.   On: 06/30/2017 01:56   US Renal  Result Date: 06/30/2017 CLINICAL DATA:  Pyohydronephrosis. EXAM: RENAL / URINARY TRACT ULTRASOUND COMPLETE COMPARISON:  Radiographs earlier this day.  CT 06/26/2017  FINDINGS: Right Kidney: Length: 9.8 cm. Previous hydronephrosis has diminished from prior CT. Ureteral stent tentatively visualized in the lower calyx. No shadowing calculi. Left Kidney: Length: 10.0 cm. Echogenicity within normal limits. No mass or hydronephrosis visualized. Bladder: Appears normal for degree of bladder distention. Previous stone at the ureterovesicular junction is not visualized. IMPRESSION: 1. Decreased right hydronephrosis from prior exam. 2. No left hydronephrosis. Electronically Signed   By: Jeb Levering M.D.   On: 06/30/2017 06:23   Dg C-arm 1-60 Min-no Report  Result Date: 06/26/2017 Fluoroscopy was utilized by the requesting physician.  No radiographic interpretation.   Ct Renal Stone Study  Result Date: 06/26/2017 CLINICAL DATA:  Flank pain. EXAM: CT ABDOMEN AND PELVIS WITHOUT CONTRAST TECHNIQUE: Multidetector CT imaging of the abdomen and pelvis was performed following the standard protocol without IV contrast. COMPARISON:  CT 05/03/2017 FINDINGS: Lower chest: No consolidation or pleural fluid. Minimal hypoventilatory changes in the lung bases. Hepatobiliary: Decreased hepatic density consistent with steatosis. No focal lesion allowing for lack contrast. Gallbladder physiologically distended, no calcified stone. No biliary dilatation. Pancreas: No ductal dilatation or inflammation. Spleen: Normal in size without focal abnormality. Adrenals/Urinary Tract: No adrenal nodule. Obstructing 9 x  11 mm stone in the right proximal ureter (at the level of L3-L4) with moderate proximal hydroureteronephrosis and perinephric edema. There is a 6 x 7 mm stone at the right ureterovesicular junction. No left hydronephrosis. No left urolithiasis. Urinary bladder is physiologically distended without wall thickening. Stomach/Bowel: Stomach is nondistended. No small bowel inflammation, obstruction or wall thickening. Mild wall thickening of the ascending colon adjacent to renal inflammation likely  reactive. Small to moderate colonic stool burden. Normal appendix. Vascular/Lymphatic: Mild aorta bi-iliac atherosclerosis. Retroaortic left renal vein. No bulky abdominal or pelvic adenopathy. Small calcified density in the pelvis likely calcified lymph node. Reproductive: Uterus and bilateral adnexa are unremarkable. Small amount of free fluid in the pelvis may be physiologic or reactive. Other: No free air or intra-abdominal abscess. Musculoskeletal: Mild facet arthropathy at L5-S1. There are no acute or suspicious osseous abnormalities. IMPRESSION: 1. Obstructing 9 x 11 mm stone in the right proximal ureter with moderate hydroureteronephrosis and perinephric edema. There is an additional 6 x 7 stone at the right ureterovesicular junction. 2. Hepatic steatosis. 3.  Aortic Atherosclerosis (ICD10-I70.0). Electronically Signed   By: Jeb Levering M.D.   On: 06/26/2017 06:30    Microbiology: Recent Results (from the past 240 hour(s))  Blood Culture (routine x 2)     Status: None (Preliminary result)   Collection Time: 06/26/17  6:49 AM  Result Value Ref Range Status   Specimen Description   Final    BLOOD RIGHT ANTECUBITAL Performed at [facility] [address] [place], [place] [address]    Special Requests   Final    BOTTLES DRAWN AEROBIC AND ANAEROBIC Blood Culture adequate volume Performed at [facility] [address] [place], [place] [address]    Culture   Final    NO GROWTH 4 DAYS Performed at [facility], [place] [address], [place], [place] [address]    Report Status PENDING  Incomplete  Blood Culture (routine x 2)     Status: None (Preliminary result)   Collection Time: 06/26/17 10:48 AM  Result Value Ref Range Status   Specimen Description   Final    BLOOD LEFT HAND Performed at [facility] [address], [place], [place] [address]    Special Requests   Final    IN PEDIATRIC BOTTLE Blood Culture adequate  volume Performed at [facility] [address], [place], [place] [address]    Culture   Final    NO GROWTH 4 DAYS Performed at [facility], [place] [address] [place], [place] [address]    Report Status PENDING  Incomplete  Urine culture     Status: None   Collection Time: 06/30/17  1:18 AM  Result Value Ref Range Status   Specimen Description   Final    URINE, RANDOM Performed at [facility] [address] [place], [place] [address]    Special Requests   Final    NONE Performed at [facility], [place] [address] [place], [place] [id number]    Culture   Final    NO GROWTH Performed at [facility], [place] [address] [place],  [address]    Report Status 07/01/2017 FINAL  Final     Labs: Basic Metabolic Panel: Recent Labs  Lab 06/25/17 2006 06/27/17 0410 06/30/17 0322 07/01/17 1137  NA 133* 141 139 138  K 3.3* 4.1 3.1* 4.2  CL 100* 110 101 104  CO2 20* 24 24 25   GLUCOSE 94 171* 140* 118*  BUN 10 12 12 9   CREATININE 1.04* 0.87 0.95 0.96  CALCIUM 8.9 8.9 9.5 9.0   Liver Function Tests: Recent Labs  Lab 06/25/17 2006 06/30/17 0322  AST 33 38  ALT 43 41  ALKPHOS 84 77  BILITOT 2.3* 0.4  PROT 7.8 7.3  ALBUMIN 4.1 3.7   No results for input(s): LIPASE, AMYLASE in the last 168 hours. No results for input(s): AMMONIA in the last 168 hours. CBC: Recent Labs  Lab 06/25/17 2006 06/27/17 0410 06/30/17 0322  WBC 18.1* 16.5* 15.2*  NEUTROABS 14.1*  --  13.9*  HGB 11.3* 10.2* 12.7  HCT 33.6* 31.5* 37.5  MCV 93.1 94.9 93.1  PLT 301 318 343       Signed:  Oswald Hillock MD.  [facility] 07/01/2017, 1:20 PM

## 2017-07-02 NOTE — Discharge Summary (Signed)
Physician Discharge Summary  Tracy Johns GMW:[id number] DOB: 1975-09-11 DOA: 06/30/2017  PCP: Clent Demark, PA-C  Admit date: 06/30/2017 Discharge date: 07/02/2017  Time spent:40* minutes  Recommendations for Outpatient Follow-up:  1. Follow up PCP in 2 weeks   Discharge Diagnoses:  Principal Problem:   Pyelonephritis Active Problems:   Essential hypertension   Sepsis [facility])   Discharge Condition: Stable  Diet recommendation: Regular diet  Filed Weights   06/30/17 0101  Weight: 84.8 kg (187 lb)    History of present illness:  42 y.o. female with medical history significant of HTN, HLD, prolactinoma.  Patient recently admitted to our service and discharged yesterday with UTI, sepsis, pyohydronephrosis secondary to R ureter obstructing stone.  Underwent ureteral stent placement.  Patient improved in hospital on rocephin (last dose given on 2/17 in the evening).  Discharged 2/18 on bactrim.  Was doing well until 11pm when she developed nausea, R flank pain, RLQ pain.     Hospital Course:   Sepsis due to acute Pyelonephritis in the setting of nephrolithiasis: patient was discharged on Bactrim, came back to the hospital with flank pain, fever, nausea. Urine culture obtained showed contamination. I called and discussed with ID Dr. Megan Salon, will discharge patient on PO Vantin  for three more days.  Hypokalemia replete  Essential hypertension Continue HCTZ 12.5 mg PO daily  Prolactinoma continue weekly Cabergoline.    Procedures:  None   Consultations:  None   Discharge Exam: Vitals:   07/01/17 0554 07/01/17 1400  BP: 114/64   Pulse: (!) 57 88  Resp: 18   Temp: 98.5 F (36.9 C) 97.9 F (36.6 C)  SpO2: 96%     General: Appears in no acute distress Cardiovascular: S1S2 RRR Respiratory: Clear bilaterally  Discharge Instructions   Discharge Instructions    Diet - low sodium heart healthy   Complete by:  As directed    Increase  activity slowly   Complete by:  As directed      Allergies as of 07/01/2017      Reactions   Imitrex [sumatriptan] Nausea And Vomiting   Percocet [oxycodone-acetaminophen] Itching, Other (See Comments)   Body feeling like it's "burning," also      Medication List    STOP taking these medications   aspirin-acetaminophen-caffeine 250-250-65 MG tablet Commonly known as:  EXCEDRIN MIGRAINE   ibuprofen 800 MG tablet Commonly known as:  ADVIL,MOTRIN   ondansetron 4 MG disintegrating tablet Commonly known as:  ZOFRAN ODT   ondansetron 4 MG tablet Commonly known as:  ZOFRAN     TAKE these medications   cabergoline 0.5 MG tablet Commonly known as:  DOSTINEX Take 0.5 tablets (0.25 mg total) by mouth once a week. What changed:  when to take this   cefpodoxime 200 MG tablet Commonly known as:  VANTIN Take 1 tablet (200 mg total) by mouth every 12 (twelve) hours.   hydrochlorothiazide 12.5 MG tablet Commonly known as:  HYDRODIURIL Take 1 tablet (12.5 mg total) by mouth daily.   HYDROcodone-acetaminophen 5-325 MG tablet Commonly known as:  NORCO/VICODIN Take 1-2 tablets by mouth every 4 (four) hours as needed for moderate pain.   lisinopril 10 MG tablet Commonly known as:  PRINIVIL,ZESTRIL Take 1 tablet (10 mg total) by mouth daily.   promethazine 25 MG tablet Commonly known as:  PHENERGAN Take 1 tablet (25 mg total) by mouth every 6 (six) hours as needed for nausea.   tamsulosin 0.4 MG Caps capsule Commonly known as:  FLOMAX  Take 1 capsule (0.4 mg total) by mouth daily.   VENTOLIN HFA 108 (90 Base) MCG/ACT inhaler Generic drug:  albuterol Inhale 2 puffs into the lungs every 6 (six) hours as needed for wheezing or shortness of breath.      Allergies  Allergen Reactions  . Imitrex [Sumatriptan] Nausea And Vomiting  . Percocet [Oxycodone-Acetaminophen] Itching and Other (See Comments)    Body feeling like it's "burning," also    Follow-up Information    Clent Demark, PA-C Follow up in 2 week(s).   Specialty:  Physician Assistant Contact information: [place] [address] [phone]            The results of significant diagnostics from this hospitalization (including imaging, microbiology, ancillary and laboratory) are listed below for reference.    Significant Diagnostic Studies: Dg Chest 2 View  Result Date: 06/30/2017 CLINICAL DATA:  Kidney stone with fever EXAM: CHEST  2 VIEW COMPARISON:  06/25/2017 FINDINGS: The heart size and mediastinal contours are within normal limits. Both lungs are clear. The visualized skeletal structures are unremarkable. Partially visualized stent in the upper abdomen on lateral view. IMPRESSION: No active cardiopulmonary disease. Electronically Signed   By: Donavan Foil M.D.   On: 06/30/2017 01:27   Dg Chest 2 View  Result Date: 06/25/2017 CLINICAL DATA:  Fever, cough and congestion for 48 hours. EXAM: CHEST  2 VIEW COMPARISON:  None. FINDINGS: The cardiac silhouette, mediastinal and hilar contours are within normal limits. The lungs are clear. No infiltrates or effusions. The bony thorax is intact. IMPRESSION: No acute cardiopulmonary findings. Electronically Signed   By: Marijo Sanes M.D.   On: 06/25/2017 20:28   Dg Abdomen 1 View  Result Date: 06/30/2017 CLINICAL DATA:  Stent placed for stones EXAM: ABDOMEN - 1 VIEW COMPARISON:  CT 06/26/2017, radiograph 05/15/2017 FINDINGS: Nonobstructed bowel-gas pattern with moderate stool. Interim placement of right-sided ureteral stent. 11 mm stone adjacent to the proximal stent at the junction of the first and middle thirds. Additional stone adjacent to the pigtail at the bladder. IMPRESSION: 1. Right-sided ureteral stent with 11 mm stone near the proximal stent and 6 mm stone in the right pelvis. Position appears similar compared to the recent CT. 2. Nonobstructed gas pattern Electronically Signed   By: Donavan Foil M.D.   On: 06/30/2017  01:56   US Renal  Result Date: 06/30/2017 CLINICAL DATA:  Pyohydronephrosis. EXAM: RENAL / URINARY TRACT ULTRASOUND COMPLETE COMPARISON:  Radiographs earlier this day.  CT 06/26/2017 FINDINGS: Right Kidney: Length: 9.8 cm. Previous hydronephrosis has diminished from prior CT. Ureteral stent tentatively visualized in the lower calyx. No shadowing calculi. Left Kidney: Length: 10.0 cm. Echogenicity within normal limits. No mass or hydronephrosis visualized. Bladder: Appears normal for degree of bladder distention. Previous stone at the ureterovesicular junction is not visualized. IMPRESSION: 1. Decreased right hydronephrosis from prior exam. 2. No left hydronephrosis. Electronically Signed   By: Jeb Levering M.D.   On: 06/30/2017 06:23   Dg C-arm 1-60 Min-no Report  Result Date: 06/26/2017 Fluoroscopy was utilized by the requesting physician.  No radiographic interpretation.   Ct Renal Stone Study  Result Date: 06/26/2017 CLINICAL DATA:  Flank pain. EXAM: CT ABDOMEN AND PELVIS WITHOUT CONTRAST TECHNIQUE: Multidetector CT imaging of the abdomen and pelvis was performed following the standard protocol without IV contrast. COMPARISON:  CT 05/03/2017 FINDINGS: Lower chest: No consolidation or pleural fluid. Minimal hypoventilatory changes in the lung bases. Hepatobiliary: Decreased hepatic density consistent with  steatosis. No focal lesion allowing for lack contrast. Gallbladder physiologically distended, no calcified stone. No biliary dilatation. Pancreas: No ductal dilatation or inflammation. Spleen: Normal in size without focal abnormality. Adrenals/Urinary Tract: No adrenal nodule. Obstructing 9 x 11 mm stone in the right proximal ureter (at the level of L3-L4) with moderate proximal hydroureteronephrosis and perinephric edema. There is a 6 x 7 mm stone at the right ureterovesicular junction. No left hydronephrosis. No left urolithiasis. Urinary bladder is physiologically distended without wall  thickening. Stomach/Bowel: Stomach is nondistended. No small bowel inflammation, obstruction or wall thickening. Mild wall thickening of the ascending colon adjacent to renal inflammation likely reactive. Small to moderate colonic stool burden. Normal appendix. Vascular/Lymphatic: Mild aorta bi-iliac atherosclerosis. Retroaortic left renal vein. No bulky abdominal or pelvic adenopathy. Small calcified density in the pelvis likely calcified lymph node. Reproductive: Uterus and bilateral adnexa are unremarkable. Small amount of free fluid in the pelvis may be physiologic or reactive. Other: No free air or intra-abdominal abscess. Musculoskeletal: Mild facet arthropathy at L5-S1. There are no acute or suspicious osseous abnormalities. IMPRESSION: 1. Obstructing 9 x 11 mm stone in the right proximal ureter with moderate hydroureteronephrosis and perinephric edema. There is an additional 6 x 7 stone at the right ureterovesicular junction. 2. Hepatic steatosis. 3.  Aortic Atherosclerosis (ICD10-I70.0). Electronically Signed   By: Jeb Levering M.D.   On: 06/26/2017 06:30    Microbiology: Recent Results (from the past 240 hour(s))  Blood Culture (routine x 2)     Status: None   Collection Time: 06/26/17  6:49 AM  Result Value Ref Range Status   Specimen Description   Final    BLOOD RIGHT ANTECUBITAL Performed at [facility] [address], [place], [place] [address]    Special Requests   Final    BOTTLES DRAWN AEROBIC AND ANAEROBIC Blood Culture adequate volume Performed at [facility] [address] [place], [place] [address]    Culture   Final    NO GROWTH 5 DAYS Performed at [facility], [place] [address] [place], [place] [address]    Report Status 07/01/2017 FINAL  Final  Blood Culture (routine x 2)     Status: None   Collection Time: 06/26/17 10:48 AM  Result Value Ref Range Status   Specimen Description   Final    BLOOD LEFT HAND Performed  at [facility] [address] [place], [place] [address]    Special Requests   Final    IN PEDIATRIC BOTTLE Blood Culture adequate volume Performed at [facility] [address] [place], [place] [address]    Culture   Final    NO GROWTH 5 DAYS Performed at [facility], [place] [address], [place], [place] [address]    Report Status 07/01/2017 FINAL  Final  Urine culture     Status: None   Collection Time: 06/30/17  1:18 AM  Result Value Ref Range Status   Specimen Description   Final    URINE, RANDOM Performed at [facility] [address], [place], [place] [address]    Special Requests   Final    NONE Performed at [facility], [place] [address], [place], [place] [address]    Culture   Final    NO GROWTH Performed at [facility], [place] [address], [place], [place] [address]    Report Status 07/01/2017 FINAL  Final  Culture, blood (  routine x 2)     Status: None (Preliminary result)   Collection Time: 06/30/17  3:20 AM  Result Value Ref Range Status   Specimen Description   Final    BLOOD RIGHT ANTECUBITAL Performed at [facility] [address], [place], [place] [address]    Special Requests   Final    BOTTLES DRAWN AEROBIC AND ANAEROBIC Blood Culture adequate volume Performed at [facility] [address], [place], [place] [address]    Culture   Final    NO GROWTH 2 DAYS Performed at [facility] [address] [place], [place] [address]    Report Status PENDING  Incomplete  Culture, blood (routine x 2)     Status: None (Preliminary result)   Collection Time: 06/30/17  4:20 PM  Result Value Ref Range Status   Specimen Description   Final    BLOOD RIGHT ANTECUBITAL Performed at [facility] [address], [place], [place] [address]    Special Requests   Final    IN PEDIATRIC BOTTLE Blood Culture adequate  volume Performed at [facility] [address], [place], [place] [address]    Culture   Final    NO GROWTH 2 DAYS Performed at [facility] [address], [place],  [address]    Report Status PENDING  Incomplete     Labs: Basic Metabolic Panel: Recent Labs  Lab 06/25/17 2006 06/27/17 0410 06/30/17 0322 07/01/17 1137  NA 133* 141 139 138  K 3.3* 4.1 3.1* 4.2  CL 100* 110 101 104  CO2 20* 24 24 25   GLUCOSE 94 171* 140* 118*  BUN 10 12 12 9   CREATININE 1.04* 0.87 0.95 0.96  CALCIUM 8.9 8.9 9.5 9.0   Liver Function Tests: Recent Labs  Lab 06/25/17 2006 06/30/17 0322  AST 33 38  ALT 43 41  ALKPHOS 84 77  BILITOT 2.3* 0.4  PROT 7.8 7.3  ALBUMIN 4.1 3.7   No results for input(s): LIPASE, AMYLASE in the last 168 hours. No results for input(s): AMMONIA in the last 168 hours. CBC: Recent Labs  Lab 06/25/17 2006 06/27/17 0410 06/30/17 0322  WBC 18.1* 16.5* 15.2*  NEUTROABS 14.1*  --  13.9*  HGB 11.3* 10.2* 12.7  HCT 33.6* 31.5* 37.5  MCV 93.1 94.9 93.1  PLT 301 318 343       Signed:  Oswald Hillock MD.  [facility] 07/02/2017, 5:31 PM

## 2017-07-05 LAB — CULTURE, BLOOD (ROUTINE X 2)
CULTURE: NO GROWTH
Culture: NO GROWTH
SPECIAL REQUESTS: ADEQUATE
Special Requests: ADEQUATE

## 2017-07-07 ENCOUNTER — Other Ambulatory Visit: Payer: Self-pay | Admitting: Urology

## 2017-07-07 ENCOUNTER — Encounter (HOSPITAL_BASED_OUTPATIENT_CLINIC_OR_DEPARTMENT_OTHER): Payer: Self-pay | Admitting: *Deleted

## 2017-07-07 ENCOUNTER — Other Ambulatory Visit: Payer: Self-pay

## 2017-07-07 ENCOUNTER — Encounter (INDEPENDENT_AMBULATORY_CARE_PROVIDER_SITE_OTHER): Payer: Self-pay | Admitting: Physician Assistant

## 2017-07-07 ENCOUNTER — Ambulatory Visit (INDEPENDENT_AMBULATORY_CARE_PROVIDER_SITE_OTHER): Payer: Managed Care, Other (non HMO) | Admitting: Physician Assistant

## 2017-07-07 VITALS — BP 106/75 | HR 84 | Temp 98.0°F | Resp 18 | Ht 61.0 in | Wt 196.0 lb

## 2017-07-07 DIAGNOSIS — Z5321 Procedure and treatment not carried out due to patient leaving prior to being seen by health care provider: Secondary | ICD-10-CM | POA: Diagnosis not present

## 2017-07-07 NOTE — Progress Notes (Signed)
SPOKE W/ PT VIA PHONE FOR PRE-OP INTERVIEW.  NPO AFTER MN.  ARRIVE AT [id number].  CURRENT LAB RESULTS IN CHART AND Epic.  NEEDS EKG.  MAY TAKE TYLENOL IF NEEDED AM DOS W/ SIPS OF WATER.

## 2017-07-07 NOTE — Progress Notes (Signed)
Patient left without being seen.

## 2017-07-10 ENCOUNTER — Encounter (HOSPITAL_BASED_OUTPATIENT_CLINIC_OR_DEPARTMENT_OTHER): Payer: Self-pay | Admitting: *Deleted

## 2017-07-10 ENCOUNTER — Other Ambulatory Visit: Payer: Self-pay

## 2017-07-10 ENCOUNTER — Ambulatory Visit (HOSPITAL_BASED_OUTPATIENT_CLINIC_OR_DEPARTMENT_OTHER)
Admission: RE | Admit: 2017-07-10 | Discharge: 2017-07-10 | Disposition: A | Discharge status: Home / Self Care | Payer: Managed Care, Other (non HMO) | Source: Ambulatory Visit | Attending: Urology | Admitting: Urology

## 2017-07-10 ENCOUNTER — Ambulatory Visit (HOSPITAL_BASED_OUTPATIENT_CLINIC_OR_DEPARTMENT_OTHER): Payer: Managed Care, Other (non HMO) | Admitting: Certified Registered Nurse Anesthetist

## 2017-07-10 ENCOUNTER — Encounter (HOSPITAL_BASED_OUTPATIENT_CLINIC_OR_DEPARTMENT_OTHER)
Admission: RE | Disposition: A | Discharge status: Home / Self Care | Payer: Self-pay | Source: Ambulatory Visit | Attending: Urology

## 2017-07-10 DIAGNOSIS — N201 Calculus of ureter: Secondary | ICD-10-CM

## 2017-07-10 DIAGNOSIS — N136 Pyonephrosis: Secondary | ICD-10-CM | POA: Insufficient documentation

## 2017-07-10 DIAGNOSIS — Z79899 Other long term (current) drug therapy: Secondary | ICD-10-CM | POA: Insufficient documentation

## 2017-07-10 DIAGNOSIS — I10 Essential (primary) hypertension: Secondary | ICD-10-CM | POA: Diagnosis not present

## 2017-07-10 DIAGNOSIS — F17210 Nicotine dependence, cigarettes, uncomplicated: Secondary | ICD-10-CM | POA: Insufficient documentation

## 2017-07-10 HISTORY — DX: Personal history of other specified conditions: Z87.898

## 2017-07-10 HISTORY — DX: Personal history of urinary calculi: Z87.442

## 2017-07-10 HISTORY — DX: Hyperprolactinemia: E22.1

## 2017-07-10 HISTORY — DX: Benign neoplasm of pituitary gland: D35.2

## 2017-07-10 HISTORY — DX: Dyspnea, unspecified: R06.00

## 2017-07-10 HISTORY — PX: CYSTOSCOPY/URETEROSCOPY/HOLMIUM LASER/STENT PLACEMENT: SHX6546

## 2017-07-10 HISTORY — DX: Personal history of other diseases of the nervous system and sense organs: Z86.69

## 2017-07-10 HISTORY — DX: Presence of spectacles and contact lenses: Z97.3

## 2017-07-10 HISTORY — DX: Other forms of dyspnea: R06.09

## 2017-07-10 HISTORY — DX: Personal history of urinary (tract) infections: Z87.440

## 2017-07-10 SURGERY — CYSTOSCOPY/URETEROSCOPY/HOLMIUM LASER/STENT PLACEMENT
Anesthesia: General | Site: Renal | Laterality: Right

## 2017-07-10 MED ORDER — FENTANYL CITRATE (PF) 100 MCG/2ML IJ SOLN
INTRAMUSCULAR | Status: AC
  Filled 2017-07-10: qty 2

## 2017-07-10 MED ORDER — MIDAZOLAM HCL 2 MG/2ML IJ SOLN
INTRAMUSCULAR | Status: AC
  Filled 2017-07-10: qty 2

## 2017-07-10 MED ORDER — FENTANYL CITRATE (PF) 100 MCG/2ML IJ SOLN
25.0000 ug | INTRAMUSCULAR | Status: DC | PRN
  Administered 2017-07-10: 25 ug via INTRAVENOUS
  Filled 2017-07-10: qty 1

## 2017-07-10 MED ORDER — LACTATED RINGERS IV SOLN
INTRAVENOUS | Status: DC
  Administered 2017-07-10: 13:00:00 via INTRAVENOUS
  Filled 2017-07-10: qty 1000

## 2017-07-10 MED ORDER — PROPOFOL 10 MG/ML IV BOLUS
INTRAVENOUS | Status: DC | PRN
  Administered 2017-07-10: 200 mg via INTRAVENOUS

## 2017-07-10 MED ORDER — LIDOCAINE 2% (20 MG/ML) 5 ML SYRINGE
INTRAMUSCULAR | Status: DC | PRN
  Administered 2017-07-10: 80 mg via INTRAVENOUS

## 2017-07-10 MED ORDER — SODIUM CHLORIDE 0.9 % IV SOLN
INTRAVENOUS | Status: AC
  Filled 2017-07-10: qty 100

## 2017-07-10 MED ORDER — DEXAMETHASONE SODIUM PHOSPHATE 10 MG/ML IJ SOLN
INTRAMUSCULAR | Status: DC | PRN
  Administered 2017-07-10: 10 mg via INTRAVENOUS

## 2017-07-10 MED ORDER — KETOROLAC TROMETHAMINE 30 MG/ML IJ SOLN
30.0000 mg | Freq: Once | INTRAMUSCULAR | Status: AC
  Administered 2017-07-10: 30 mg via INTRAVENOUS
  Filled 2017-07-10: qty 1

## 2017-07-10 MED ORDER — SODIUM CHLORIDE 0.9 % IR SOLN
Status: DC | PRN
  Administered 2017-07-10 (×2): 3000 mL via INTRAVESICAL

## 2017-07-10 MED ORDER — MEPERIDINE HCL 25 MG/ML IJ SOLN
6.2500 mg | INTRAMUSCULAR | Status: DC | PRN
  Filled 2017-07-10: qty 1

## 2017-07-10 MED ORDER — KETOROLAC TROMETHAMINE 30 MG/ML IJ SOLN
INTRAMUSCULAR | Status: AC
  Filled 2017-07-10: qty 1

## 2017-07-10 MED ORDER — ONDANSETRON HCL 4 MG/2ML IJ SOLN
INTRAMUSCULAR | Status: AC
  Filled 2017-07-10: qty 2

## 2017-07-10 MED ORDER — ONDANSETRON HCL 4 MG/2ML IJ SOLN
INTRAMUSCULAR | Status: DC | PRN
  Administered 2017-07-10: 4 mg via INTRAVENOUS

## 2017-07-10 MED ORDER — CEFTRIAXONE SODIUM 2 G IJ SOLR
INTRAMUSCULAR | Status: AC
Start: 2017-07-10 — End: ?
  Filled 2017-07-10: qty 20

## 2017-07-10 MED ORDER — IOHEXOL 300 MG/ML  SOLN
INTRAMUSCULAR | Status: DC | PRN
  Administered 2017-07-10: 3 mL via URETHRAL

## 2017-07-10 MED ORDER — DEXAMETHASONE SODIUM PHOSPHATE 10 MG/ML IJ SOLN
INTRAMUSCULAR | Status: AC
  Filled 2017-07-10: qty 1

## 2017-07-10 MED ORDER — PROPOFOL 10 MG/ML IV BOLUS
INTRAVENOUS | Status: AC
  Filled 2017-07-10: qty 20

## 2017-07-10 MED ORDER — FENTANYL CITRATE (PF) 100 MCG/2ML IJ SOLN
INTRAMUSCULAR | Status: DC | PRN
  Administered 2017-07-10 (×4): 25 ug via INTRAVENOUS

## 2017-07-10 MED ORDER — LACTATED RINGERS IV SOLN
INTRAVENOUS | Status: DC
  Filled 2017-07-10: qty 1000

## 2017-07-10 MED ORDER — TRAMADOL HCL 50 MG PO TABS: 50.0000 mg | ORAL_TABLET | Freq: Four times a day (QID) | ORAL | 0 refills | Status: AC | PRN

## 2017-07-10 MED ORDER — SODIUM CHLORIDE 0.9 % IV SOLN
2.0000 g | INTRAVENOUS | Status: AC
  Administered 2017-07-10: 2 g via INTRAVENOUS
  Filled 2017-07-10: qty 20

## 2017-07-10 MED ORDER — MIDAZOLAM HCL 5 MG/5ML IJ SOLN
INTRAMUSCULAR | Status: DC | PRN
  Administered 2017-07-10: 2 mg via INTRAVENOUS

## 2017-07-10 MED ORDER — LIDOCAINE 2% (20 MG/ML) 5 ML SYRINGE
INTRAMUSCULAR | Status: AC
  Filled 2017-07-10: qty 5

## 2017-07-10 MED ORDER — METOCLOPRAMIDE HCL 5 MG/ML IJ SOLN
10.0000 mg | Freq: Once | INTRAMUSCULAR | Status: DC | PRN
  Filled 2017-07-10: qty 2

## 2017-07-10 MED ORDER — SODIUM CHLORIDE 0.9 % IV SOLN
INTRAVENOUS | Status: DC
  Administered 2017-07-10: 10:00:00 via INTRAVENOUS
  Filled 2017-07-10: qty 1000

## 2017-07-10 SURGICAL SUPPLY — 20 items
BAG DRAIN URO-CYSTO SKYTR STRL (DRAIN) ×3 IMPLANT
BASKET LASER NITINOL 1.9FR (BASKET) IMPLANT
BASKET ZERO TIP NITINOL 2.4FR (BASKET) ×6 IMPLANT
CATH INTERMIT  6FR 70CM (CATHETERS) IMPLANT
CLOTH BEACON ORANGE TIMEOUT ST (SAFETY) ×3 IMPLANT
FIBER LASER FLEXIVA 365 (UROLOGICAL SUPPLIES) IMPLANT
FIBER LASER TRAC TIP (UROLOGICAL SUPPLIES) ×3 IMPLANT
GLOVE BIO SURGEON STRL SZ7.5 (GLOVE) ×9 IMPLANT
GOWN STRL REUS W/TWL LRG LVL3 (GOWN DISPOSABLE) ×9 IMPLANT
GUIDEWIRE ANG ZIPWIRE 038X150 (WIRE) ×3 IMPLANT
GUIDEWIRE STR DUAL SENSOR (WIRE) ×3 IMPLANT
INFUSOR MANOMETER BAG 3000ML (MISCELLANEOUS) ×3 IMPLANT
IV NS IRRIG 3000ML ARTHROMATIC (IV SOLUTION) ×6 IMPLANT
KIT TURNOVER CYSTO (KITS) ×3 IMPLANT
MANIFOLD NEPTUNE II (INSTRUMENTS) ×3 IMPLANT
PACK CYSTO (CUSTOM PROCEDURE TRAY) ×3 IMPLANT
SHEATH URETERAL 12FRX28CM (UROLOGICAL SUPPLIES) ×3 IMPLANT
STENT URET 6FRX26 CONTOUR (STENTS) ×3 IMPLANT
TUBE CONNECTING 12'X1/4 (SUCTIONS) ×1
TUBE CONNECTING 12X1/4 (SUCTIONS) ×2 IMPLANT

## 2017-07-10 NOTE — Anesthesia Procedure Notes (Signed)
Procedure Name: LMA Insertion Date/Time: 07/10/2017 11:11 AM Performed by: Genelle Bal, CRNA Pre-anesthesia Checklist: Patient identified, Emergency Drugs available, Suction available and Patient being monitored Patient Re-evaluated:Patient Re-evaluated prior to induction Oxygen Delivery Method: Circle system utilized Preoxygenation: Pre-oxygenation with 100% oxygen Induction Type: IV induction Ventilation: Mask ventilation without difficulty LMA: LMA inserted LMA Size: 4.0 Number of attempts: 1 Airway Equipment and Method: Bite block Placement Confirmation: positive ETCO2 Tube secured with: Tape Dental Injury: Teeth and Oropharynx as per pre-operative assessment

## 2017-07-10 NOTE — Discharge Instructions (Signed)
NO ADVIL, ALEVE, IBUPROFEN , MOTRIN  UNTIL 630 PM TONIGHT  Ureteral Stent Implantation, Care After Refer to this sheet in the next few weeks. These instructions provide you with information about caring for yourself after your procedure. Your health care provider may also give you more specific instructions. Your treatment has been planned according to current medical practices, but problems sometimes occur. Call your health care provider if you have any problems or questions after your procedure.   Removal of the stent: Remove the stent by pulling the string on Monday morning July 13, 2017.  What can I expect after the procedure? After the procedure, it is common to have:  Nausea.  Mild pain when you urinate. You may feel this pain in your lower back or lower abdomen. Pain should stop within a few minutes after you urinate. This may last for up to 1 week.  A small amount of blood in your urine for several days.  Follow these instructions at home:  Medicines  Take over-the-counter and prescription medicines only as told by your health care provider.  If you were prescribed an antibiotic medicine, take it as told by your health care provider. Do not stop taking the antibiotic even if you start to feel better.  Do not drive for 24 hours if you received a sedative.  Do not drive or operate heavy machinery while taking prescription pain medicines. Activity  Return to your normal activities as told by your health care provider. Ask your health care provider what activities are safe for you.  Do not lift anything that is heavier than 10 lb (4.5 kg). Follow this limit for 1 week after your procedure, or for as long as told by your health care provider. General instructions  Watch for any blood in your urine. Call your health care provider if the amount of blood in your urine increases.  If you have a catheter: ? Follow instructions from your health care provider about taking care of  your catheter and collection bag. ? Do not take baths, swim, or use a hot tub until your health care provider approves.  Drink enough fluid to keep your urine clear or pale yellow.  Keep all follow-up visits as told by your health care provider. This is important. Contact a health care provider if:  You have pain that gets worse or does not get better with medicine, especially pain when you urinate.  You have difficulty urinating.  You feel nauseous or you vomit repeatedly during a period of more than 2 days after the procedure. Get help right away if:  Your urine is dark red or has blood clots in it.  You are leaking urine (have incontinence).  The end of the stent comes out of your urethra.  You cannot urinate.  You have sudden, sharp, or severe pain in your abdomen or lower back.  You have a fever. This information is not intended to replace advice given to you by your health care provider. Make sure you discuss any questions you have with your health care provider. Document Released: 12/29/2012 Document Revised: 10/04/2015 Document Reviewed: 11/10/2014 Elsevier Interactive Patient Education  2018 [facility] Instructions  Activity: Get plenty of rest for the remainder of the day. A responsible individual must stay with you for 24 hours following the procedure.  For the next 24 hours, DO NOT: -Drive a car -Paediatric nurse -Drink alcoholic beverages -Take any medication unless instructed by your physician -  Make any legal decisions or sign important papers.  Meals: Start with liquid foods such as gelatin or soup. Progress to regular foods as tolerated. Avoid greasy, spicy, heavy foods. If nausea and/or vomiting occur, drink only clear liquids until the nausea and/or vomiting subsides. Call your physician if vomiting continues.  Special Instructions/Symptoms: Your throat may feel dry or sore from the anesthesia or the breathing tube  placed in your throat during surgery. If this causes discomfort, gargle with warm salt water. The discomfort should disappear within 24 hours.  If you had a scopolamine patch placed behind your ear for the management of post- operative nausea and/or vomiting:  1. The medication in the patch is effective for 72 hours, after which it should be removed.  Wrap patch in a tissue and discard in the trash. Wash hands thoroughly with soap and water. 2. You may remove the patch earlier than 72 hours if you experience unpleasant side effects which may include dry mouth, dizziness or visual disturbances. 3. Avoid touching the patch. Wash your hands with soap and water after contact with the patch.

## 2017-07-10 NOTE — Anesthesia Preprocedure Evaluation (Signed)
Anesthesia Evaluation  Patient identified by MRN, date of birth, ID band Patient awake    Reviewed: Allergy & Precautions, NPO status , Patient's Chart, lab work & pertinent test results  Airway Mallampati: II  TM Distance: >3 FB Neck ROM: Full    Dental no notable dental hx.    Pulmonary Current Smoker,    Pulmonary exam normal breath sounds clear to auscultation       Cardiovascular hypertension, Pt. on medications Normal cardiovascular exam Rhythm:Regular Rate:Normal     Neuro/Psych  Headaches, Seizures -, Well Controlled,  negative psych ROS   GI/Hepatic negative GI ROS, Neg liver ROS,   Endo/Other  negative endocrine ROS  Renal/GU negative Renal ROS     Musculoskeletal negative musculoskeletal ROS (+)   Abdominal (+) + obese,   Peds  Hematology  (+) anemia , HLD   Anesthesia Other Findings right ureteral stone  Reproductive/Obstetrics                             Anesthesia Physical  Anesthesia Plan  ASA: III and emergent  Anesthesia Plan: General   Post-op Pain Management:    Induction: Intravenous  PONV Risk Score and Plan: 2 and Dexamethasone, Ondansetron, Midazolam and Treatment may vary due to age or medical condition  Airway Management Planned: LMA  Additional Equipment:   Intra-op Plan:   Post-operative Plan: Extubation in OR  Informed Consent: I have reviewed the patients History and Physical, chart, labs and discussed the procedure including the risks, benefits and alternatives for the proposed anesthesia with the patient or authorized representative who has indicated his/her understanding and acceptance.   Dental advisory given  Plan Discussed with: CRNA  Anesthesia Plan Comments:         Anesthesia Quick Evaluation

## 2017-07-10 NOTE — Transfer of Care (Signed)
Last Vitals:  Vitals:   07/10/17 0852  BP: 123/87  Pulse: 89  Resp: 16  Temp: 37 C  SpO2: 99%    Last Pain:  Vitals:   07/10/17 0930  TempSrc:   PainSc: 8       Patients Stated Pain Goal: 5 (07/10/17 0930)  Immediate Anesthesia Transfer of Care Note  Patient: Tracy Johns  Procedure(s) Performed: Procedure(s) (LRB): CYSTOSCOPY/URETEROSCOPY/HOLMIUM LASER/STENT EXCHANGE (Right)  Patient Location: PACU  Anesthesia Type: General  Level of Consciousness: awake, alert  and oriented  Airway & Oxygen Therapy: Patient Spontanous Breathing and Patient connected to nasal cannula oxygen  Post-op Assessment: Report given to PACU RN and Post -op Vital signs reviewed and stable  Post vital signs: Reviewed and stable  Complications: No apparent anesthesia complications

## 2017-07-10 NOTE — Anesthesia Postprocedure Evaluation (Signed)
Anesthesia Post Note  Patient: Tracy Johns  Procedure(s) Performed: CYSTOSCOPY/URETEROSCOPY/HOLMIUM LASER/STENT EXCHANGE (Right Renal)     Patient location during evaluation: PACU Anesthesia Type: General Level of consciousness: awake and alert Pain management: pain level controlled Vital Signs Assessment: post-procedure vital signs reviewed and stable Respiratory status: spontaneous breathing, nonlabored ventilation, respiratory function stable and patient connected to nasal cannula oxygen Cardiovascular status: blood pressure returned to baseline and stable Postop Assessment: no apparent nausea or vomiting Anesthetic complications: no    Last Vitals:  Vitals:   07/10/17 1300 07/10/17 1315  BP: 139/79 124/75  Pulse: 70 (!) 59  Resp: 20 12  Temp:    SpO2: 100% 100%    Last Pain:  Vitals:   07/10/17 1315  TempSrc:   PainSc: 4                  Montez Hageman

## 2017-07-10 NOTE — Op Note (Signed)
Preoperative diagnosis: Right ureteral stones Postoperative diagnosis: Same  Procedure: Cystoscopy, right retrograde pyelogram, right ureteroscopy with holmium laser lithotripsy, stone basket extraction and right ureteral stent exchange  Surgeon: Junious Silk  Anesthesia: General  Indication for procedure: 42 year old female stented urgently for fever and obstructing right stones.  She was brought today for definitive stone management.  Findings: Stone in the right intramural ureter at the bladder, large stone right proximal ureter.  Right retrograde pyelogram-this outlined the proximal ureter and collecting system with mild dilation and no extravasation.  No stone fragments or other filling defects were noted on scout imaging or on retrograde.  Description of procedure: After consent was obtained the patient brought to the operating room.  After adequate anesthesia she was placed in lithotomy position and prepped and draped in the usual sterile fashion.  A timeout was performed to confirm the patient and procedure.  The cystoscope was passed per urethra and the bladder inspected.  The right ureteral stent was grasped and removed through the urethral meatus and a Glidewire was advanced into the collecting system.  A semirigid ureteroscope dual channel was passed and the stone was located in the intramural ureter just inside the right ureteral orifice.  There was not a lot of space but it fragmented well at 0.4 and 53.  The stones were sequentially dropped into the bladder.  The bladder was drained.  I was able to get the scope easily up to the proximal ureter where the second larger stone was noted.  Again it fragmented well at 0.4 and 53 into relatively large pieces.  These were very hard stones.  They did not dust.  I was able to grab the pieces and sequentially dropped in the bladder.  The most proximal piece I was able to grab but it was a little too big but I was nicely able to spend it in the basket  and laser the edges off until it was manageable to drop into the bladder although I did pop 1 of the basket loops and had to repassed a new basket.  The ureter was inspected again no other significant fragments were noted.  On scout imaging there were no other stones noted in the stones had been visible on scout imaging.  Because of going in and out of the ureter multiple times passed the ureteroscope back to the mid ureter and injected contrast and no extravasation was noted.  The ureter was inspected on the way out noted to be normal.  The wire was backloaded on the cystoscope and a 6 x 26 cm stent was advanced.  The wire was removed with a good coil seen in the kidney and a good coil in the bladder.  Bladder was drained and all the fragments were drained.  She was awakened and taken to the recovery room in stable condition.  Complications: None  Blood loss: Minimal  Specimens: Stone fragments to patient/office lab  Drains: 6 x 26 cm right ureteral stent with string

## 2017-07-10 NOTE — Interval H&P Note (Signed)
History and Physical Interval Note:  07/10/2017 10:53 AM  Tracy Johns  has presented today for surgery, with the diagnosis of RIGHT URETERAL STONE  The various methods of treatment have been discussed with the patient and family. After consideration of risks, benefits and other options for treatment, the patient has consented to  Procedure(s): CYSTOSCOPY/URETEROSCOPY/HOLMIUM LASER/STENT EXCHANGE (Right) as a surgical intervention.  She returned to ED with fever and f/u urine cx negative. [place] normal. She hasnt had fever again. She completed abx and started on the Cipro I sent this week. The patient's history has been reviewed, patient examined, no change in status, stable for surgery.  I have reviewed the patient's chart and labs. Discussed stent exchange/string. Questions were answered to the patient's satisfaction.     Festus Aloe

## 2017-07-10 NOTE — Progress Notes (Signed)
Pt pulled stent in PACU. She was leaking and it was visible, so it was removed.

## 2017-07-13 ENCOUNTER — Ambulatory Visit (INDEPENDENT_AMBULATORY_CARE_PROVIDER_SITE_OTHER): Payer: Managed Care, Other (non HMO) | Admitting: Physician Assistant

## 2017-07-13 ENCOUNTER — Encounter (HOSPITAL_BASED_OUTPATIENT_CLINIC_OR_DEPARTMENT_OTHER): Payer: Self-pay | Admitting: Urology

## 2017-07-29 ENCOUNTER — Encounter (HOSPITAL_BASED_OUTPATIENT_CLINIC_OR_DEPARTMENT_OTHER): Payer: Self-pay | Admitting: Urology

## 2017-09-17 IMAGING — MR MR HEAD WO/W CM
19 series · 48 of 48 positions shown · IV contrast (multihance)
Comparison: None.

CLINICAL DATA: Hyperprolactinemia.  Chronic headaches.

Creatinine was obtained on site at [HOSPITAL] at [HOSPITAL].
Results: Creatinine 0.9 mg/dL.
EXAM:
MRI HEAD WITHOUT AND WITH CONTRAST
TECHNIQUE: Multiplanar, multiecho pulse sequences of the brain and surrounding
structures were obtained without and with intravenous contrast.
CONTRAST:  19 mL MultiHance

[Series 5: T1 · sagittal · 4.0mm · 0.72mm/px · 3 of 27 slices shown (1 of 5)]
[im 1/27]
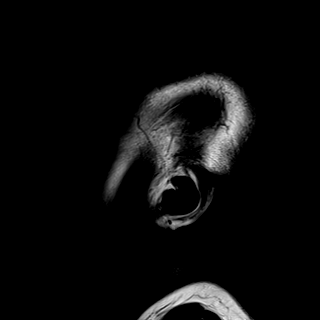
[im 14/27]
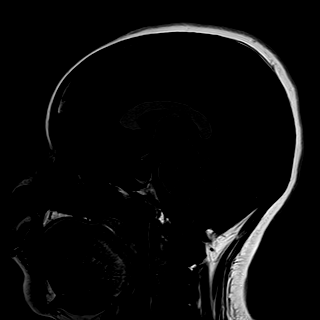
[im 27/27]
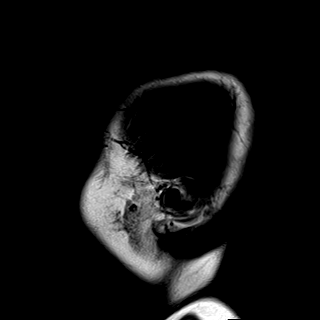

[Series 6: DWI · axial · 3.0mm · 1.44mm/px · z∈[-51,+107]mm · 7 of 84 slices shown (1 of 2)]
[im 1/84]
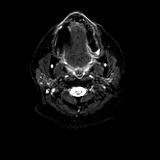
[im 14/84]
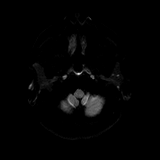
[im 28/84]
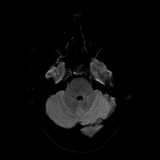
[im 42/84]
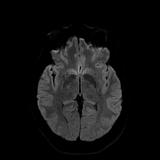
[im 56/84]
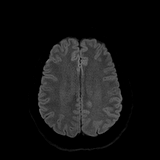
[im 70/84]
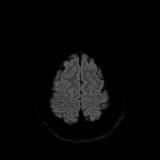
[im 84/84]
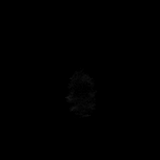

[Series 7: DWI · axial · 3.0mm · 1.44mm/px · z∈[-51,+107]mm · 4 of 42 slices shown (2 of 2)]
[im 1/42]
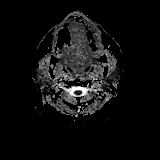
[im 14/42]
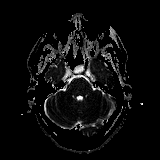
[im 28/42]
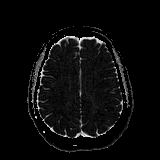
[im 42/42]
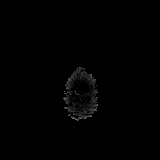

[Series 8: T2 · axial · 4.0mm · 0.36mm/px · z∈[-36,+92]mm · 2 of 26 slices shown]
[im 1/26]
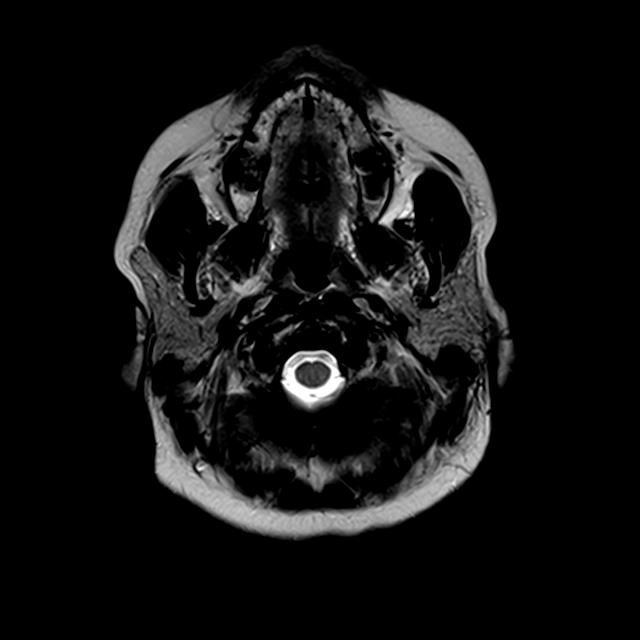
[im 26/26]
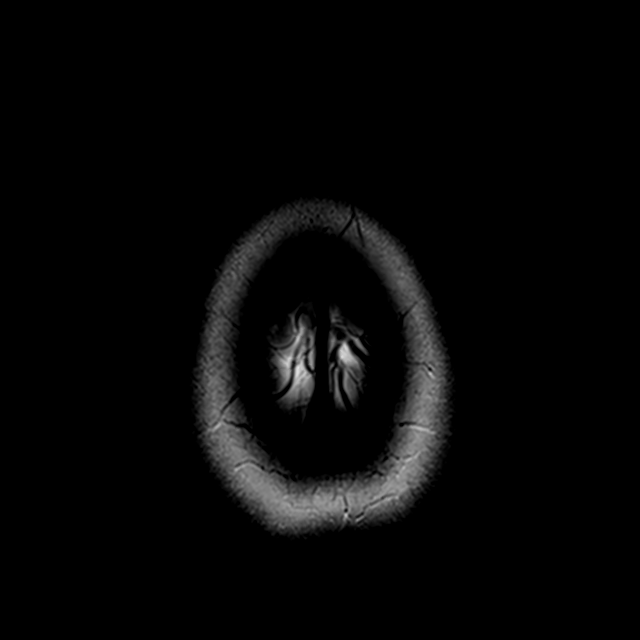

[Series 9: GRE · axial · 4.0mm · 0.69mm/px · z∈[-36,+92]mm · 2 of 26 slices shown]
[im 1/26]
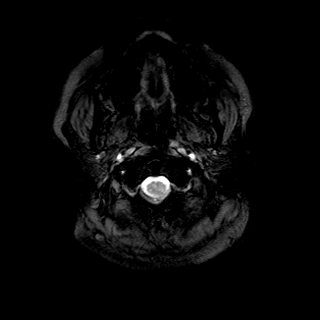
[im 26/26]
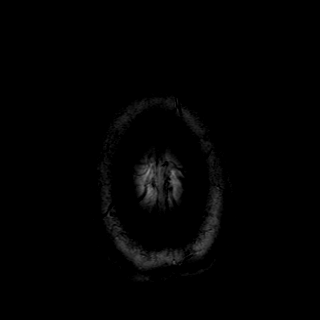

[Series 10: FLAIR · axial · 3.0mm · 0.72mm/px · z∈[-41,+96]mm · 2 of 24 slices shown]
[im 1/24]
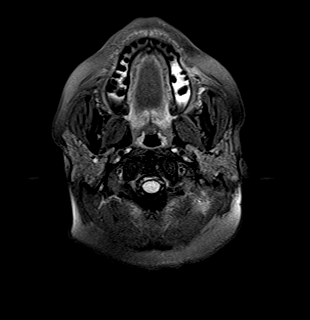
[im 24/24]
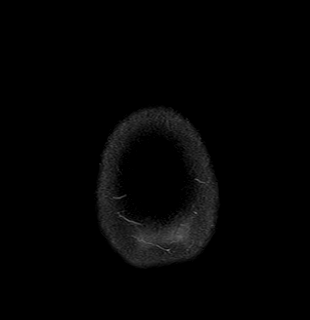

[Series 11: T1 · sagittal · 3.0mm · 0.42mm/px · 1 of 13 slices shown (2 of 5)]
[im 1/13]
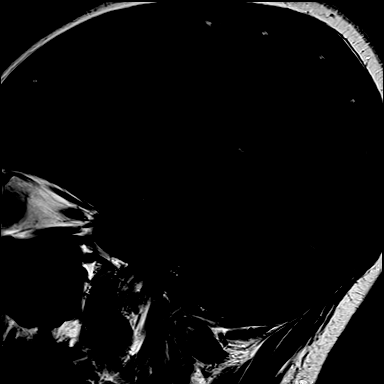

[Series 12: T1 · coronal · 3.0mm · 0.42mm/px · 1 of 12 slices shown (3 of 5)]
[im 1/12]
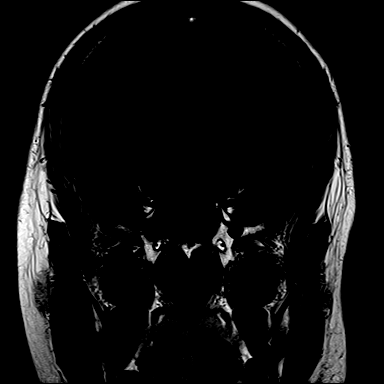

[Series 13: T1 · coronal · non-contrast · 3.0mm · 0.62mm/px · 1 of 11 slices shown (4 of 5)]
[im 1/11]
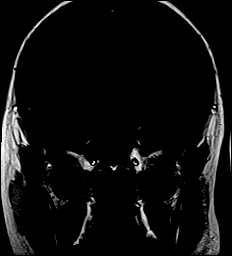

[Series 14: cor post dyn · coronal · 3.0mm · 0.62mm/px · 1 of 11 slices shown (1 of 6)]
[im 1/11]
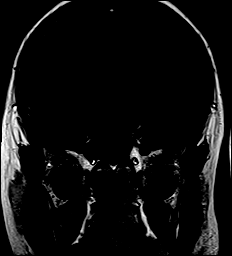

[Series 15: cor post dyn · coronal · 3.0mm · 0.62mm/px · 1 of 11 slices shown (2 of 6)]
[im 1/11]
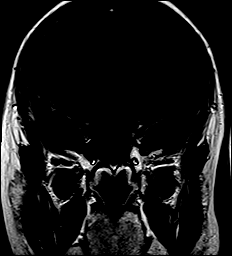

[Series 16: cor post dyn · coronal · 3.0mm · 0.62mm/px · 1 of 11 slices shown (3 of 6)]
[im 1/11]
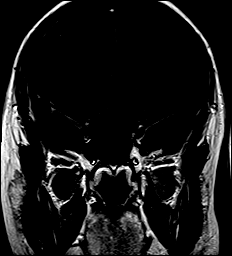

[Series 17: cor post dyn · coronal · 3.0mm · 0.62mm/px · 1 of 11 slices shown (4 of 6)]
[im 1/11]
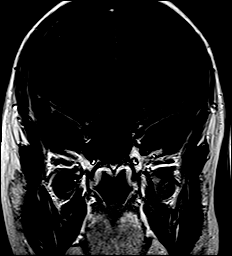

[Series 18: cor post dyn · coronal · 3.0mm · 0.62mm/px · 1 of 11 slices shown (5 of 6)]
[im 1/11]
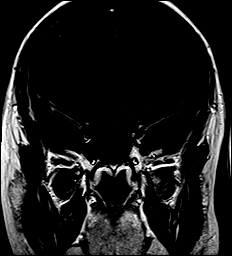

[Series 19: cor post dyn · coronal · 3.0mm · 0.62mm/px · 1 of 11 slices shown (6 of 6)]
[im 1/11]
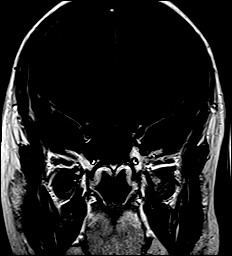

[Series 20: T1 post-contrast · sagittal · 3.0mm · 0.42mm/px · 1 of 13 slices shown (1 of 3)]
[im 1/13]
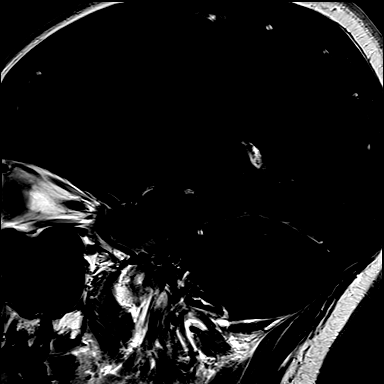

[Series 21: T1 post-contrast · coronal · 3.0mm · 0.42mm/px · 1 of 12 slices shown (2 of 3)]
[im 1/12]
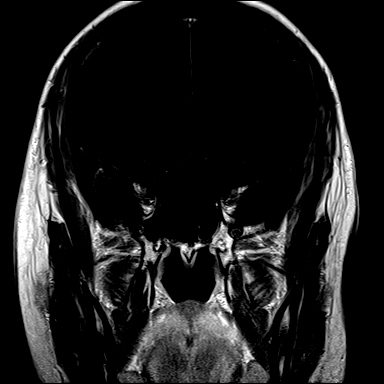

[Series 22: T1 · axial · 1.0mm · 0.86mm/px · z∈[-50,+107]mm · 14 of 160 slices shown (5 of 5)]
[im 1/160]
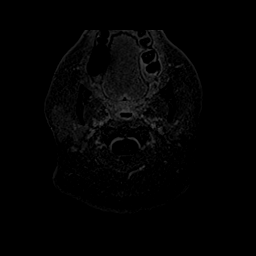
[im 13/160]
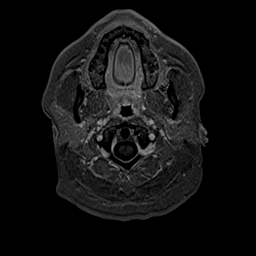
[im 25/160]
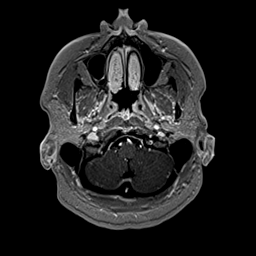
[im 37/160]
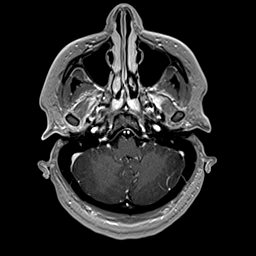
[im 49/160]
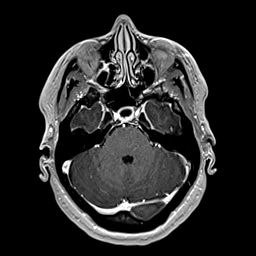
[im 62/160]
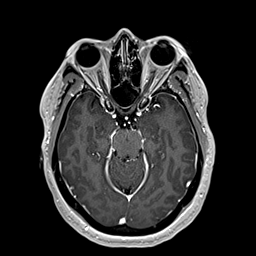
[im 74/160]
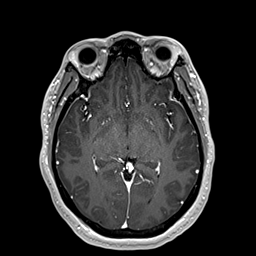
[im 86/160]
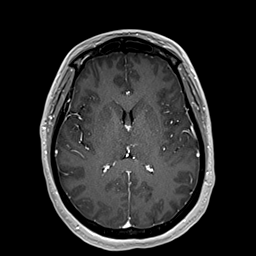
[im 98/160]
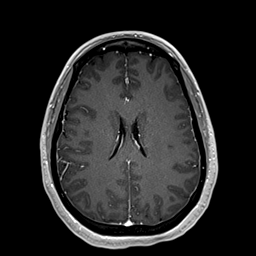
[im 111/160]
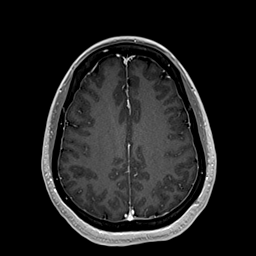
[im 123/160]
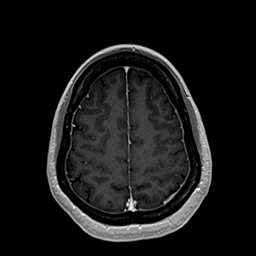
[im 135/160]
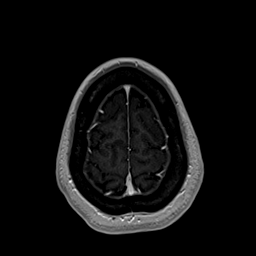
[im 147/160]
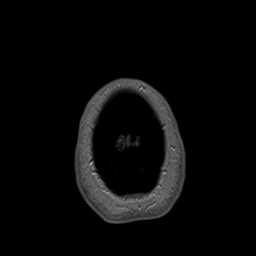
[im 160/160]
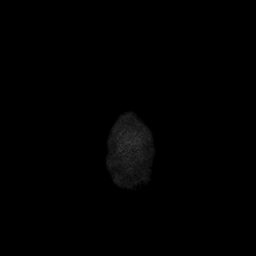

[Series 23: T1 post-contrast · coronal · 4.0mm · 0.47mm/px · 3 of 30 slices shown (3 of 3)]
[im 1/30]
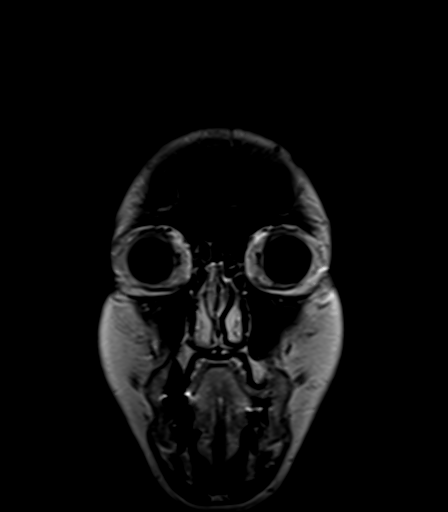
[im 15/30]
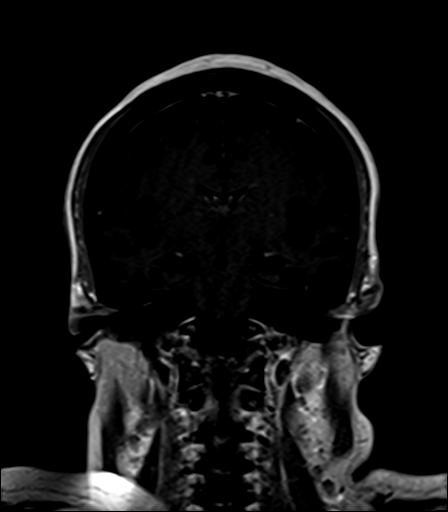
[im 30/30]
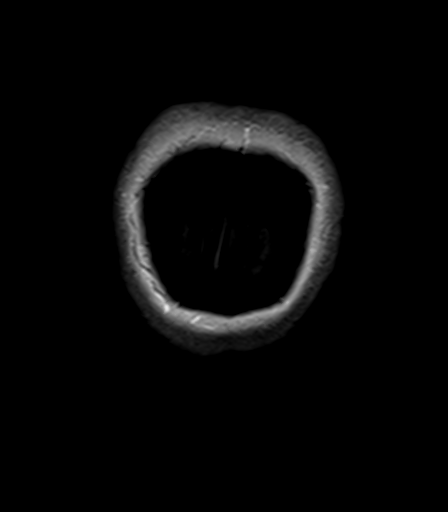

[48 of 48 positions shown; findings below may reference images not displayed]

FINDINGS: Brain: There is no evidence of acute infarct, intracranial
hemorrhage, mass, midline shift, or extra-axial fluid collection.
The ventricles and sulci are normal. The brain is normal in signal.
No abnormal enhancement is identified.

Dedicated pituitary protocol imaging was performed. There is a
partially empty sella configuration with moderate sellar expansion
and with a small amount of pituitary tissue along the floor of the
sella. There is asymmetry of the left aspect of the pituitary gland
with a 2 mm round focus of hypoenhancement (Series 21, image 7 and
series 18, image 7) and with focal depression of the floor of the
sella in this location. The gland otherwise enhances homogeneously.
Infundibulum is midline. The optic chiasm and cavernous sinuses are
unremarkable.

Vascular: Major intracranial vascular flow voids are preserved.

Skull and upper cervical spine: Mildly diminished bone marrow signal
intensity diffusely, nonspecific though can be seen with anemia,
smoking, and obesity. No suspicious focal marrow lesion is
identified.

Sinuses/Orbits: Unremarkable orbits. Minimal mucosal thickening in
the paranasal sinuses. Clear mastoid air cells.

Other: None.
IMPRESSION: 1. Partially empty sella. 2 mm hypoenhancing focus in the left
pituitary gland suspicious for a microadenoma.
2. Otherwise unremarkable appearance of the brain.

## 2017-10-06 ENCOUNTER — Encounter: Payer: Self-pay | Admitting: Internal Medicine

## 2017-10-06 ENCOUNTER — Ambulatory Visit (INDEPENDENT_AMBULATORY_CARE_PROVIDER_SITE_OTHER): Payer: Managed Care, Other (non HMO) | Admitting: Internal Medicine

## 2017-10-06 VITALS — BP 140/90 | HR 63 | Ht 61.5 in | Wt 206.0 lb

## 2017-10-06 DIAGNOSIS — E236 Other disorders of pituitary gland: Secondary | ICD-10-CM

## 2017-10-06 DIAGNOSIS — E221 Hyperprolactinemia: Secondary | ICD-10-CM | POA: Diagnosis not present

## 2017-10-06 DIAGNOSIS — R946 Abnormal results of thyroid function studies: Secondary | ICD-10-CM | POA: Diagnosis not present

## 2017-10-06 DIAGNOSIS — R7989 Other specified abnormal findings of blood chemistry: Secondary | ICD-10-CM | POA: Diagnosis not present

## 2017-10-06 DIAGNOSIS — D352 Benign neoplasm of pituitary gland: Secondary | ICD-10-CM

## 2017-10-06 LAB — TSH: TSH: 2.1 u[IU]/mL (ref 0.35–4.50)

## 2017-10-06 LAB — T3, FREE: T3, Free: 5 pg/mL — ABNORMAL HIGH (ref 2.3–4.2)

## 2017-10-06 LAB — CORTISOL: Cortisol, Plasma: 11.2 ug/dL

## 2017-10-06 LAB — T4, FREE: FREE T4: 0.73 ng/dL (ref 0.60–1.60)

## 2017-10-06 NOTE — Progress Notes (Signed)
Patient ID: Tracy Johns, female   DOB: 11-12-1975, 42 y.o.   MRN: [id number]    HPI  Tracy Johns is a 42 y.o.-year-old female, returning for follow-up for hyperprolactinemia, pituitary microadenoma, empty sella.  Last visit 6 months ago.  Since last visit, she had frequent UTIs, pyohydronephrosis and cystoscopy for ureterolithiasis.  She had many ED visits and admissions for these.   Reviewed and addended history: Pt. has been found to have a high prolactin level in 2012 [facility]) >> saw endocrinology >> Prolactin 300s >> A pituitary MRI was checked >> negative for a pituitary tumor.  She started cabergoline once a week >> PRL decreased >> menses returned.  She then moved to [place] >> was homeless for a period of time, a lot of stress. She started again to have irregular menses. Now she and boyfriend would like to get pregnant (She had tubal ligation in 2002 >> will try to have it reversed) >> saw PCP >> In 07/2016, prolactin was 101.7. And LH was 6.9, FSH 9.2, both normal. She had repeat investigation in 08/2016:  Prolactin was still elevated, at 108. A TSH level, CMP, CBC were all normal.  Pituitary MRI (11/02/2016): 1. Partially empty sella. 2 mm hypoenhancing focus in the left pituitary gland suspicious for a microadenoma. 2. Otherwise unremarkable appearance of the brain. Pituitary microadenoma, suspicious for prolactinoma. Partially empty sella, most likely incidental finding.  Reviewed pituitary hormone levels: Component     Latest Ref Rng & Units 10/03/2016  IGF-I, LC/MS     52 - 328 ng/mL 154  Z-Score (Female)     -2.0 - 2.0 SD 0.2  Prolactin, Total     ng/mL 55.0 (H)  Prolactin, Monomeric     3.2 - 25.2 ng/mL 45.6 (H)  TSH     0.35 - 4.50 uIU/mL 4.15  T4,Free(Direct)     0.60 - 1.60 ng/dL 0.75  Triiodothyronine,Free,Serum     2.3 - 4.2 pg/mL 4.6 (H)  Cortisol, Plasma     ug/dL 19.6  C206 ACTH     6 - 50 pg/mL 188 (H)   Her free T3 slightly high, with  normal TSH and free T4. Both monomeric and total prolactin levels are high, although not as high as before.  Her ACTH was high with a normal cortisol level.  A dexamethasone suppression test was normal, with no sign of Cushing sd.:  Component     Latest Ref Rng & Units 10/31/2016  Cortisol - AM     mcg/dL 0.6 (L)  Dexamethasone, Serum     ng/dL 538   We started cabergoline 0.25 mg weekly, after which she started to have regular menses.  Patient mentions: - Weight gain - fatigue - HAs, but less intense than before - no galactorrhea, but increased sensitivity of nipples  Reviewed patient's TFTs: Lab Results  Component Value Date   TSH 4.15 10/03/2016   TSH 1.620 08/12/2016   FREET4 0.75 10/03/2016    Lab Results  Component Value Date   T3FREE 4.6 (H) 10/03/2016   Pt. also has a history of seizure and vaso-vagal syncope >> stopped breathing - during an endometrial Bx. The syncopal episode happened with acute distress/pain.  ROS: Constitutional: + See HPI, no subjective hyperthermia, no subjective hypothermia Eyes: no blurry vision, no xerophthalmia ENT: no sore throat, no nodules palpated in throat, no dysphagia, no odynophagia, no hoarseness Cardiovascular: no CP/no SOB/no palpitations/no leg swelling Respiratory: no cough/no SOB/no wheezing Gastrointestinal: no N/no V/no D/no C/no acid  reflux Musculoskeletal: + muscle aches/no joint aches Skin: no rashes, no hair loss Neurological: no tremors/no numbness/no tingling/no dizziness  I reviewed pt's medications, allergies, PMH, social hx, family hx, and changes were documented in the history of present illness. Otherwise, unchanged from my initial visit note.  Past Medical History:  Diagnosis Date  . Dyspnea on exertion   . History of acute pyelonephritis 06/26/2017   w/ sepsis  . History of kidney stones    per pt 2008 and passed spontenaously  . History of pseudoseizure    07-07-2017 per had first episode seizure  activity that told to her caused by extreme stress in 2012, 2015 and last one 2017;  per pt was told no medication needed  . Hypercholesterolemia   . Hyperprolactinemia [facility]) dx 2012   endocrinologist-  dr Cruzita Lederer  . Hypertension   . Migraines   . Pituitary microadenoma ([place])    dx per MRI 06/ 2018  . Wears glasses    Past Surgical History:  Procedure Laterality Date  . CESAREAN SECTION  x2  last one 2000   Bilateral Tubal Ligation w/ last c/s  . CYSTOSCOPY/RETROGRADE/URETEROSCOPY Right 06/26/2017   Procedure: CYSTOSCOPY RIGHT RETROGRADE RIGHT CYSTOSCOPY;  Surgeon: Festus Aloe, MD;  Location: [facility];  Service: Urology;  Laterality: Right;  . CYSTOSCOPY/URETEROSCOPY/HOLMIUM LASER/STENT PLACEMENT Right 07/10/2017   Procedure: CYSTOSCOPY/URETEROSCOPY/HOLMIUM LASER/STENT EXCHANGE;  Surgeon: Festus Aloe, MD;  Location: [facility];  Service: Urology;  Laterality: Right;   Social History   Social History  . Marital status: Single    Spouse name: N/A  . Number of children: 4   Occupational History  . CSR for [employer]   Social History Main Topics  . Smoking status: Current Every Day Smoker    Packs/day: 1.5     Types: Cigarettes  . Smokeless tobacco: Never Used  . Alcohol use Yes     Comment: wine, beer 3x a week  . Drug use: No   Current Outpatient Medications on File Prior to Visit  Medication Sig Dispense Refill  . acetaminophen (TYLENOL) 500 MG tablet Take 500 mg by mouth every 6 (six) hours as needed.    [name] albuterol (VENTOLIN HFA) 108 (90 Base) MCG/ACT inhaler Inhale 2 puffs into the lungs every 6 (six) hours as needed for wheezing or shortness of breath.     [name] aspirin-acetaminophen-caffeine (EXCEDRIN MIGRAINE) 250-250-65 MG tablet Take by mouth every 6 (six) hours as needed for headache.    . cabergoline (DOSTINEX) 0.5 MG tablet Take 0.5 tablets (0.25 mg total) by mouth once a week. (Patient taking differently: Take 0.25 mg by mouth every Sunday. ) 10  tablet 2  . hydrochlorothiazide (HYDRODIURIL) 12.5 MG tablet Take 1 tablet (12.5 mg total) by mouth daily. (Patient taking differently: Take 12.5 mg by mouth every evening. ) 30 tablet 2  . lisinopril (PRINIVIL,ZESTRIL) 10 MG tablet Take 1 tablet (10 mg total) by mouth daily. (Patient taking differently: Take 10 mg by mouth every evening. ) 30 tablet 2  . traMADol (ULTRAM) 50 MG tablet Take 1 tablet (50 mg total) by mouth every 6 (six) hours as needed. 12 tablet 0   No current facility-administered medications on file prior to visit.    Allergies  Allergen Reactions  . Imitrex [Sumatriptan] Nausea And Vomiting  . Oxycodone Itching    Percocet "feel extremely itching and skin burns"   She has FH of: DM, HTN, HL, heart ds., cancer  PE: BP 140/90 (BP Location: Right Arm,  Patient Position: Sitting, Cuff Size: Normal)   Pulse 63   Ht 5' 1.5" (1.562 m)   Wt 206 lb (93.4 kg)   SpO2 97%   BMI 38.29 kg/m  Wt Readings from Last 3 Encounters:  10/06/17 206 lb (93.4 kg)  07/10/17 197 lb 8 oz (89.6 kg)  07/07/17 196 lb (88.9 kg)   Constitutional: overweight, in NAD Eyes: PERRLA, EOMI, no exophthalmos ENT: moist mucous membranes, no thyromegaly, no cervical lymphadenopathy Cardiovascular: RRR, No MRG Respiratory: CTA B Gastrointestinal: abdomen soft, NT, ND, BS+ Musculoskeletal: no deformities, strength intact in all 4 Skin: moist, warm, no rashes Neurological: no tremor with outstretched hands, DTR normal in all 4  ASSESSMENT: 1. Hyperprolactinemia/2. prolactinoma  3. Empty sella  4.  Elevated ACTH  5. Abnormal thyroid test - elevated free T3  PLAN:   1/2.  Patient with persistently high prolactin levels, initially without the evidence of a pituitary tumor per MRI from 2012, but with a 2 mm pituitary adenoma seen on the MRI from 10/2016.  We again discussed about the fact that the tumor is small and usually benign, slow-growing or even regressing tumors. -Reviewed previous  hormonal investigation and this was normal, except for a high ACTH, however, with normal cortisol level and with normal dexamethasone suppression test -We started cabergoline 0.25 mg weekly and we discussed that this can also shrink the size of the tumor -She continues to have regular menses, which started after starting cabergoline.  No galactorrhea or breast pain, except Hypersensitivity around menses. -We will repeat her prolactin level now  3.  Empty sella -Incidental finding on MRI to investigate for pituitary tumor -Pituitary hormonal investigation was negative -No further investigation is needed for this  4.  Elevated ACTH -Patient had a high ACTH, however, with a normal cortisol level -We checked a dexamethasone suppression test and this was negative for Cushing syndrome, with a cortisol appropriately suppressed, at 0.8  -At last visit, I reordered her cortisol and ACTH level, along with a DHEAS, but she did not return for labs.  We will also check a DHEAS. -will check them today, fasting, in a.m. Will add a DHEAS.   5. Abnormal thyroid test - normal TSH at last check with elevated free T3 - will recheck TFTs today  Component     Latest Ref Rng & Units 10/06/2017  Prolactin     ng/mL 95.1 (H)  TSH     0.35 - 4.50 uIU/mL 2.10  T4,Free(Direct)     0.60 - 1.60 ng/dL 0.73  Triiodothyronine,Free,Serum     2.3 - 4.2 pg/mL 5.0 (H)  Cortisol, Plasma     ug/dL 11.2  C206 ACTH     6 - 50 pg/mL 29  DHEA-Sulfate, LCMS     ug/dL 100   ACTH, cortisol, DHEAS all normal. fT3 still high, with normal TSH and fT4 - no intervention needed for this. PRL high >> I am surprised about this result, as she is on Cabergoline weekly >> will check with her if she is taking this consistently and if she is not pregnant, but may need to increase the dose to BIW.  Philemon Kingdom, MD PhD [facility] Endocrinology

## 2017-10-06 NOTE — Patient Instructions (Addendum)
Please stop at the lab.  Please come back for a follow-up appointment in 1 year.  

## 2017-10-09 LAB — ACTH: C206 ACTH: 29 pg/mL (ref 6–50)

## 2017-10-09 LAB — PROLACTIN: Prolactin: 95.1 ng/mL — ABNORMAL HIGH

## 2017-10-11 LAB — DHEA-SULFATE, SERUM: DHEA-Sulfate, LCMS: 100 ug/dL

## 2017-10-15 ENCOUNTER — Other Ambulatory Visit: Payer: Self-pay | Admitting: Internal Medicine

## 2017-10-15 ENCOUNTER — Encounter: Payer: Self-pay | Admitting: Internal Medicine

## 2017-10-15 DIAGNOSIS — E221 Hyperprolactinemia: Secondary | ICD-10-CM

## 2017-10-16 ENCOUNTER — Other Ambulatory Visit (INDEPENDENT_AMBULATORY_CARE_PROVIDER_SITE_OTHER): Payer: Self-pay | Admitting: Physician Assistant

## 2017-10-16 DIAGNOSIS — I10 Essential (primary) hypertension: Secondary | ICD-10-CM

## 2017-11-25 ENCOUNTER — Other Ambulatory Visit: Payer: Self-pay

## 2017-11-25 ENCOUNTER — Encounter: Payer: Self-pay | Admitting: Internal Medicine

## 2017-11-25 MED ORDER — CABERGOLINE 0.5 MG PO TABS: 0.2500 mg | ORAL_TABLET | ORAL | 2 refills | Status: DC

## 2018-01-09 ENCOUNTER — Emergency Department (HOSPITAL_COMMUNITY)
Admission: EM | Admit: 2018-01-09 | Discharge: 2018-01-09 | Disposition: A | Discharge status: Home / Self Care | Payer: Managed Care, Other (non HMO) | Attending: Emergency Medicine | Admitting: Emergency Medicine

## 2018-01-09 ENCOUNTER — Encounter (HOSPITAL_COMMUNITY): Payer: Self-pay

## 2018-01-09 ENCOUNTER — Other Ambulatory Visit: Payer: Self-pay

## 2018-01-09 DIAGNOSIS — Z79899 Other long term (current) drug therapy: Secondary | ICD-10-CM | POA: Diagnosis not present

## 2018-01-09 DIAGNOSIS — J069 Acute upper respiratory infection, unspecified: Secondary | ICD-10-CM | POA: Diagnosis not present

## 2018-01-09 DIAGNOSIS — I10 Essential (primary) hypertension: Secondary | ICD-10-CM | POA: Diagnosis not present

## 2018-01-09 DIAGNOSIS — F17210 Nicotine dependence, cigarettes, uncomplicated: Secondary | ICD-10-CM | POA: Diagnosis not present

## 2018-01-09 DIAGNOSIS — R05 Cough: Secondary | ICD-10-CM | POA: Diagnosis present

## 2018-01-09 MED ORDER — DOXYCYCLINE HYCLATE 100 MG PO CAPS: 100.0000 mg | ORAL_CAPSULE | Freq: Two times a day (BID) | ORAL | 0 refills | Status: DC

## 2018-01-09 NOTE — ED Notes (Signed)
Patient is in Peds with child.

## 2018-01-09 NOTE — ED Provider Notes (Addendum)
[place] [facility] EMERGENCY DEPARTMENT Provider Note   CSN: [id number] Arrival date & time: 01/09/18  0131     History   Chief Complaint Chief Complaint  Patient presents with  . Cough    HPI Tracy Johns is a 42 y.o. female.  Patient is a 42 year old female with history of migraines, kidney stones, hypertension.  She presents with chest congestion, cough, sinus pressure, and ear pressure.  This is been ongoing for the past 9 days.  She was started on Augmentin 5 days ago, however is worsening rather than improving.  She denies any fevers or chills.  The history is provided by the patient.  Cough  This is a new problem. Episode onset: 9 days ago. The problem occurs constantly. The cough is non-productive. There has been no fever. Associated symptoms include ear congestion, ear pain and headaches. Pertinent negatives include no chills. Treatments tried: Augmentin. The treatment provided no relief. She is a smoker.    Past Medical History:  Diagnosis Date  . Dyspnea on exertion   . History of acute pyelonephritis 06/26/2017   w/ sepsis  . History of kidney stones    per pt 2008 and passed spontenaously  . History of pseudoseizure    07-07-2017 per had first episode seizure activity that told to her caused by extreme stress in 2012, 2015 and last one 2017;  per pt was told no medication needed  . Hypercholesterolemia   . Hyperprolactinemia [facility]) dx 2012   endocrinologist-  dr Cruzita Lederer  . Hypertension   . Migraines   . Pituitary microadenoma ([place])    dx per MRI 06/ 2018  . Wears glasses     Patient Active Problem List   Diagnosis Date Noted  . Abnormal thyroid function test 10/06/2017  . Nephrolithiasis 06/27/2017  . Hypokalemia 06/27/2017  . Hyponatremia 06/27/2017  . Essential hypertension 06/27/2017  . Sepsis ([place]) 06/27/2017  . Pyelonephritis 06/26/2017  . Empty sella ([place]) 04/06/2017  . Prolactinoma ([place]) 04/06/2017  . High serum  adrenocorticotropic hormone (ACTH) 04/06/2017  . Hyperprolactinemia ([place]) 10/02/2016    Past Surgical History:  Procedure Laterality Date  . CESAREAN SECTION  x2  last one 2000   Bilateral Tubal Ligation w/ last c/s  . CYSTOSCOPY/RETROGRADE/URETEROSCOPY Right 06/26/2017   Procedure: CYSTOSCOPY RIGHT RETROGRADE RIGHT CYSTOSCOPY;  Surgeon: Festus Aloe, MD;  Location: [facility];  Service: Urology;  Laterality: Right;  . CYSTOSCOPY/URETEROSCOPY/HOLMIUM LASER/STENT PLACEMENT Right 07/10/2017   Procedure: CYSTOSCOPY/URETEROSCOPY/HOLMIUM LASER/STENT EXCHANGE;  Surgeon: Festus Aloe, MD;  Location: [facility];  Service: Urology;  Laterality: Right;     OB History   None      Home Medications    Prior to Admission medications   Medication Sig Start Date End Date Taking? Authorizing Provider  acetaminophen (TYLENOL) 500 MG tablet Take 500 mg by mouth every 6 (six) hours as needed.    [provider]  albuterol (VENTOLIN HFA) 108 (90 Base) MCG/ACT inhaler Inhale 2 puffs into the lungs every 6 (six) hours as needed for wheezing or shortness of breath.     [provider]  aspirin-acetaminophen-caffeine (EXCEDRIN MIGRAINE) [phone] MG tablet Take by mouth every 6 (six) hours as needed for headache.    [provider]  cabergoline (DOSTINEX) 0.5 MG tablet Take 0.5 tablets (0.25 mg total) by mouth once a week. 11/25/17   Philemon Kingdom, MD  hydrochlorothiazide (HYDRODIURIL) 12.5 MG tablet Take 1 tablet (12.5 mg total) by mouth daily. Patient taking differently: Take 12.5  mg by mouth every evening.  07/10/16   Clent Demark, PA-C  lisinopril (PRINIVIL,ZESTRIL) 10 MG tablet Take 1 tablet (10 mg total) by mouth every evening. 10/19/17   Clent Demark, PA-C  traMADol (ULTRAM) 50 MG tablet Take 1 tablet (50 mg total) by mouth every 6 (six) hours as needed. 07/10/17 07/10/18  Festus Aloe, MD    Family History Family History  Problem  Relation Age of Onset  . Hypertension Other   . Stroke Other   . Heart failure Other     Social History Social History   Tobacco Use  . Smoking status: Current Every Day Smoker    Packs/day: 0.50    Years: 6.00    Pack years: 3.00    Types: Cigarettes  . Smokeless tobacco: Never Used  Substance Use Topics  . Alcohol use: Yes    Comment: hasnt drank since christmas  . Drug use: No     Allergies   Imitrex [sumatriptan] and Oxycodone   Review of Systems Review of Systems  Constitutional: Negative for chills.  HENT: Positive for ear pain.   Respiratory: Positive for cough.   Neurological: Positive for headaches.  All other systems reviewed and are negative.    Physical Exam Updated Vital Signs BP (!) 158/97   Pulse 66   Temp 98.2 F (36.8 C) (Oral)   Resp 20   Ht 5\' 1"  (1.549 m)   Wt 93.4 kg   LMP 12/02/2017   SpO2 98%   BMI 38.92 kg/m   Physical Exam  Constitutional: She is oriented to person, place, and time. She appears well-developed and well-nourished. No distress.  HENT:  Head: Normocephalic and atraumatic.  Mouth/Throat: Oropharynx is clear and moist.  Neck: Normal range of motion. Neck supple.  Cardiovascular: Normal rate and regular rhythm. Exam reveals no gallop and no friction rub.  No murmur heard. Pulmonary/Chest: Effort normal and breath sounds normal. No respiratory distress. She has no wheezes.  Abdominal: Soft. Bowel sounds are normal. She exhibits no distension. There is no tenderness.  Musculoskeletal: Normal range of motion.  Neurological: She is alert and oriented to person, place, and time.  Skin: Skin is warm and dry. She is not diaphoretic.  Nursing note and vitals reviewed.    ED Treatments / Results  Labs (all labs ordered are listed, but only abnormal results are displayed) Labs Reviewed - No data to display  EKG None  Radiology No results found.  Procedures Procedures (including critical care time)  Medications  Ordered in ED Medications - No data to display   Initial Impression / Assessment and Plan / ED Course  I have reviewed the triage vital signs and the nursing notes.  Pertinent labs & imaging results that were available during my care of the patient were reviewed by me and considered in my medical decision making (see chart for details).  Patient with persistent URI symptoms for the past 9 days.  She has had no relief with Augmentin.  This makes me think that her illness is likely viral, however she does not believe this to be the case and would like a different antibiotic.  She will be given doxycycline and advised to take over-the-counter decongestants as I feel that this may help alleviate some of the pressure in her sinuses and ears..  To return as needed for any problems.  Final Clinical Impressions(s) / ED Diagnoses   Final diagnoses:  None    ED Discharge Orders  None       Veryl Speak, MD 01/09/18 1003    Veryl Speak, MD 01/09/18 [phone]

## 2018-01-09 NOTE — Discharge Instructions (Addendum)
Stop taking Augmentin.  Start taking doxycycline as prescribed this evening.  Take Sudafed as a decongestant per package instructions.  This medication is available over-the-counter.

## 2018-01-09 NOTE — ED Triage Notes (Signed)
Pt here with son, pt has been sick since Sunday. Has had a lot of pressure in her sinuses, ears are ringing. Taking augmentin since Sunday and not any better. Lost her voice today.

## 2018-10-07 ENCOUNTER — Ambulatory Visit: Payer: Managed Care, Other (non HMO) | Admitting: Internal Medicine

## 2018-12-30 ENCOUNTER — Other Ambulatory Visit: Payer: Self-pay

## 2018-12-30 ENCOUNTER — Emergency Department (HOSPITAL_COMMUNITY): Payer: BC Managed Care – PPO

## 2018-12-30 ENCOUNTER — Emergency Department (HOSPITAL_COMMUNITY)
Admission: EM | Admit: 2018-12-30 | Discharge: 2018-12-30 | Disposition: A | Discharge status: Home / Self Care | Payer: BC Managed Care – PPO | Attending: Emergency Medicine | Admitting: Emergency Medicine

## 2018-12-30 ENCOUNTER — Encounter (HOSPITAL_COMMUNITY): Payer: Self-pay | Admitting: Emergency Medicine

## 2018-12-30 DIAGNOSIS — Z79899 Other long term (current) drug therapy: Secondary | ICD-10-CM | POA: Insufficient documentation

## 2018-12-30 DIAGNOSIS — R0789 Other chest pain: Secondary | ICD-10-CM | POA: Diagnosis not present

## 2018-12-30 DIAGNOSIS — F17210 Nicotine dependence, cigarettes, uncomplicated: Secondary | ICD-10-CM | POA: Diagnosis not present

## 2018-12-30 DIAGNOSIS — Z20828 Contact with and (suspected) exposure to other viral communicable diseases: Secondary | ICD-10-CM | POA: Insufficient documentation

## 2018-12-30 DIAGNOSIS — I10 Essential (primary) hypertension: Secondary | ICD-10-CM | POA: Insufficient documentation

## 2018-12-30 LAB — CBC
HCT: 40.5 % (ref 36.0–46.0)
Hemoglobin: 12.8 g/dL (ref 12.0–15.0)
MCH: 31.1 pg (ref 26.0–34.0)
MCHC: 31.6 g/dL (ref 30.0–36.0)
MCV: 98.3 fL (ref 80.0–100.0)
Platelets: 376 10*3/uL (ref 150–400)
RBC: 4.12 MIL/uL (ref 3.87–5.11)
RDW: 13 % (ref 11.5–15.5)
WBC: 7.4 10*3/uL (ref 4.0–10.5)
nRBC: 0 % (ref 0.0–0.2)

## 2018-12-30 LAB — BASIC METABOLIC PANEL
Anion gap: 11 (ref 5–15)
BUN: 10 mg/dL (ref 6–20)
CO2: 26 mmol/L (ref 22–32)
Calcium: 9.7 mg/dL (ref 8.9–10.3)
Chloride: 103 mmol/L (ref 98–111)
Creatinine, Ser: 0.85 mg/dL (ref 0.44–1.00)
GFR calc Af Amer: >60 mL/min (ref >60)
GFR calc non Af Amer: >60 mL/min (ref >60)
Glucose, Bld: 124 mg/dL — ABNORMAL HIGH (ref 70–99)
Potassium: 4 mmol/L (ref 3.5–5.1)
Sodium: 140 mmol/L (ref 135–145)

## 2018-12-30 LAB — TROPONIN I (HIGH SENSITIVITY)
Troponin I (High Sensitivity): 3 ng/L (ref <18)
Troponin I (High Sensitivity): <2 ng/L (ref <18)

## 2018-12-30 LAB — I-STAT BETA HCG BLOOD, ED (MC, WL, AP ONLY): I-stat hCG, quantitative: <5 m[IU]/mL (ref <5)

## 2018-12-30 MED ORDER — LISINOPRIL 20 MG PO TABS: 20.0000 mg | ORAL_TABLET | Freq: Every evening | ORAL | 0 refills | Status: DC

## 2018-12-30 MED ORDER — SODIUM CHLORIDE 0.9% FLUSH: 3.0000 mL | Freq: Once | INTRAVENOUS | Status: DC

## 2018-12-30 NOTE — ED Triage Notes (Signed)
Pt states she has had high blood pressure since July. Today, BP was 215 systolic. Pt states she is on BP meds (lisinipril). Pt states she has had a HA, intermittent fever, only as high at 100.7. Pt states she does have some chest tightness.

## 2018-12-30 NOTE — Discharge Instructions (Signed)
It was my pleasure taking care of you today!   Start taking 20mg  Lisinopril and logging blood pressures once a day around the same time each day.   Call your primary care doctor in the morning to schedule follow up and BP check.   Return to ER for new or worsening symptoms, any additional concerns.

## 2018-12-30 NOTE — ED Notes (Signed)
Patient verbalizes understanding of discharge instructions. Opportunity for questioning and answers were provided. Armband removed by staff, pt discharged from ED ambulatory.   

## 2018-12-30 NOTE — ED Provider Notes (Signed)
[facility] EMERGENCY DEPARTMENT Provider Note   CSN: [id number] Arrival date & time: 12/30/18  1253     History   Chief Complaint Chief Complaint  Patient presents with  . Hypertension  . Chest Pain    HPI Eyonna Sandstrom is a 43 y.o. female.     The history is provided by the patient and medical records. No language interpreter was used.   Murline Weigel is a 43 y.o. female  with a PMH as listed below who presents to the Emergency Department for two complaints:  1. Temperature of 100.7 today. Patient reports that she works at [employer] and if she has a temperature higher than 100.0, she needs to see a provider to get coronavirus testing and clearance to return to work.  She denies any fever or chills.  No cough, congestion, sore throat.  She has had a headache and been very tired.  No abdominal pain, nausea, vomiting, diarrhea or urinary symptoms.  No medications taken prior to arrival for symptoms.  She states that she personally has not had any known coronavirus exposure, but he does have a daughter who has been coming and going several places and hanging out with her friends, etc.  She is worried that her daughter could have been asymptomatic given she has been going so many different places.  2.  Elevated blood pressure readings.  Patient states that her blood pressure has been high since July.  She has been logging her blood pressure since then as well.  She reports that her readings are typically 160s-170s / 100-110. She has tried reaching out to her doctor's office, but has not been able to get an appointment.  She did do a telehealth visit with a different service who recommended that she take 2 of her 10 mg lisinopril tablets for a couple of days then see her regular doctor.  She has been complaining of intermittent sharp central chest pain as well as bilateral blurry vision described as "seeing stars".  She reports this improved when she  started taking extra of her lisinopril.   Past Medical History:  Diagnosis Date  . Dyspnea on exertion   . History of acute pyelonephritis 06/26/2017   w/ sepsis  . History of kidney stones    per pt 2008 and passed spontenaously  . History of pseudoseizure    07-07-2017 per had first episode seizure activity that told to her caused by extreme stress in 2012, 2015 and last one 2017;  per pt was told no medication needed  . Hypercholesterolemia   . Hyperprolactinemia [facility]) dx 2012   endocrinologist-  dr Cruzita Lederer  . Hypertension   . Migraines   . Pituitary microadenoma ([place])    dx per MRI 06/ 2018  . Wears glasses     Patient Active Problem List   Diagnosis Date Noted  . Abnormal thyroid function test 10/06/2017  . Nephrolithiasis 06/27/2017  . Hypokalemia 06/27/2017  . Hyponatremia 06/27/2017  . Essential hypertension 06/27/2017  . Sepsis ([place]) 06/27/2017  . Pyelonephritis 06/26/2017  . Empty sella ([place]) 04/06/2017  . Prolactinoma ([place]) 04/06/2017  . High serum adrenocorticotropic hormone (ACTH) 04/06/2017  . Hyperprolactinemia ([place]) 10/02/2016    Past Surgical History:  Procedure Laterality Date  . CESAREAN SECTION  x2  last one 2000   Bilateral Tubal Ligation w/ last c/s  . CYSTOSCOPY/RETROGRADE/URETEROSCOPY Right 06/26/2017   Procedure: CYSTOSCOPY RIGHT RETROGRADE RIGHT CYSTOSCOPY;  Surgeon: Festus Aloe, MD;  Location: [facility];  Service: Urology;  Laterality: Right;  . CYSTOSCOPY/URETEROSCOPY/HOLMIUM LASER/STENT PLACEMENT Right 07/10/2017   Procedure: CYSTOSCOPY/URETEROSCOPY/HOLMIUM LASER/STENT EXCHANGE;  Surgeon: Festus Aloe, MD;  Location: [facility];  Service: Urology;  Laterality: Right;     OB History   No obstetric history on file.      Home Medications    Prior to Admission medications   Medication Sig Start Date End Date Taking? Authorizing Provider  acetaminophen (TYLENOL) 500 MG tablet Take 500 mg by mouth every 6 (six) hours  as needed.    [provider]  albuterol (VENTOLIN HFA) 108 (90 Base) MCG/ACT inhaler Inhale 2 puffs into the lungs every 6 (six) hours as needed for wheezing or shortness of breath.     [provider]  aspirin-acetaminophen-caffeine (EXCEDRIN MIGRAINE) [phone] MG tablet Take by mouth every 6 (six) hours as needed for headache.    [provider]  cabergoline (DOSTINEX) 0.5 MG tablet Take 0.5 tablets (0.25 mg total) by mouth once a week. 11/25/17   Philemon Kingdom, MD  doxycycline (VIBRAMYCIN) 100 MG capsule Take 1 capsule (100 mg total) by mouth 2 (two) times daily. One po bid x 7 days 01/09/18   Veryl Speak, MD  hydrochlorothiazide (HYDRODIURIL) 12.5 MG tablet Take 1 tablet (12.5 mg total) by mouth daily. Patient taking differently: Take 12.5 mg by mouth every evening.  07/10/16   Clent Demark, PA-C  lisinopril (ZESTRIL) 20 MG tablet Take 1 tablet (20 mg total) by mouth every evening. 12/30/18   Anushka Hartinger, Ozella Almond, PA-C    Family History Family History  Problem Relation Age of Onset  . Hypertension Other   . Stroke Other   . Heart failure Other     Social History Social History   Tobacco Use  . Smoking status: Current Every Day Smoker    Packs/day: 0.50    Years: 6.00    Pack years: 3.00    Types: Cigarettes  . Smokeless tobacco: Never Used  Substance Use Topics  . Alcohol use: Yes    Comment: hasnt drank since christmas  . Drug use: No     Allergies   Imitrex [sumatriptan], Oxycodone, and Percocet [oxycodone-acetaminophen]   Review of Systems Review of Systems  Constitutional: Positive for fatigue and fever.  Cardiovascular: Positive for chest pain. Negative for palpitations and leg swelling.  Neurological: Positive for headaches.  All other systems reviewed and are negative.    Physical Exam Updated Vital Signs BP (!) 141/106   Pulse 83   Temp 98.6 F (37 C) (Oral)   Resp (!) 28   SpO2 100%   Physical Exam Vitals signs  and nursing note reviewed.  Constitutional:      General: She is not in acute distress.    Appearance: She is well-developed.     Comments: Nontoxic-appearing.  HENT:     Head: Normocephalic and atraumatic.     Mouth/Throat:     Pharynx: Oropharynx is clear. No oropharyngeal exudate.  Neck:     Musculoskeletal: Neck supple.     Comments: No meningeal signs. Cardiovascular:     Rate and Rhythm: Normal rate and regular rhythm.     Heart sounds: Normal heart sounds. No murmur.  Pulmonary:     Effort: Pulmonary effort is normal. No respiratory distress.     Breath sounds: Normal breath sounds.     Comments: Lungs clear to ausculation bilaterally.  Abdominal:     General: There is no distension.     Palpations: Abdomen is soft.  Tenderness: There is no abdominal tenderness.  Musculoskeletal:     Right lower leg: No edema.     Left lower leg: No edema.  Skin:    General: Skin is warm and dry.  Neurological:     Mental Status: She is alert and oriented to person, place, and time.     Comments: Alert, oriented, thought content appropriate, able to give a coherent history. Speech is clear and goal oriented, able to follow commands.  Cranial Nerves:  II:  Peripheral visual fields grossly normal, pupils equal, round, reactive to light III, IV, VI: EOM intact bilaterally, ptosis not present V,VII: smile symmetric, eyes kept closed tightly against resistance, facial light touch sensation equal VIII: hearing grossly normal IX, X: symmetric soft palate movement, uvula elevates symmetrically  XI: bilateral shoulder shrug symmetric and strong XII: midline tongue extension 5/5 muscle strength in upper and lower extremities bilaterally including strong and equal grip strength and dorsiflexion/plantar flexion Sensory to light touch normal in all four extremities.  Normal finger-to-nose and rapid alternating movements; normal gait and balance.      ED Treatments / Results  Labs (all  labs ordered are listed, but only abnormal results are displayed) Labs Reviewed  BASIC METABOLIC PANEL - Abnormal; Notable for the following components:      Result Value   Glucose, Bld 124 (*)    All other components within normal limits  SARS CORONAVIRUS 2  CBC  I-STAT BETA HCG BLOOD, ED ([facility], [facility], [facility] ONLY)  TROPONIN I (HIGH SENSITIVITY)  TROPONIN I (HIGH SENSITIVITY)    EKG EKG Interpretation  Date/Time:  Thursday December 30 2018 13:06:20 EDT Ventricular Rate:  73 PR Interval:  134 QRS Duration: 80 QT Interval:  374 QTC Calculation: 412 R Axis:   89 Text Interpretation:  Normal sinus rhythm with sinus arrhythmia Normal ECG Confirmed by Fredia Sorrow [phone]) on 12/30/2018 5:16:21 PM   Radiology Dg Chest 2 View  Result Date: 12/30/2018 CLINICAL DATA:  Fever, chest pain. EXAM: CHEST - 2 VIEW COMPARISON:  Radiographs of June 30, 2017. FINDINGS: The heart size and mediastinal contours are within normal limits. Both lungs are clear. No pneumothorax or pleural effusion is noted. The visualized skeletal structures are unremarkable. IMPRESSION: No active cardiopulmonary disease. Electronically Signed   By: Marijo Conception M.D.   On: 12/30/2018 13:36    Procedures Procedures (including critical care time)  Medications Ordered in ED Medications  sodium chloride flush (NS) 0.9 % injection 3 mL (has no administration in time range)     Initial Impression / Assessment and Plan / ED Course  I have reviewed the triage vital signs and the nursing notes.  Pertinent labs & imaging results that were available during my care of the patient were reviewed by me and considered in my medical decision making (see chart for details).       Larrie Lucia is a 43 y.o. female who presents to ED for two complaints:  1. Temperature of 100.7 at home. Work requiring provider eval given covid pandemic. She is afebrile with clear lung exam and no abdominal tenderness.  No meningeal  signs.  Denies urinary symptoms.  Chest x-ray clear without evidence of pneumonia.  Labs reviewed with normal white count. Covid test ordered.   2. Elevated blood pressure readings since July.  She has been logging these very well reporting blood pressure ranges in the 160s to 170s / 100's to 110's. Intermittent central chest pains and blurred cision over the  last several months. She has been taking 2 of her 10mg  Lisinopril occasionally with improvement of her symptoms. Troponin wdl. Normal creatinine. BP elevated in ED as well. Will increase her lisinopril to 20mg  qd and have her follow up with PCP for bp recheck as soon as possible.   Evaluation does not show pathology that would require ongoing emergent intervention or inpatient treatment. Reasons to return to ER were discussed and all questions answered.    Final Clinical Impressions(s) / ED Diagnoses   Final diagnoses:  Essential hypertension    ED Discharge Orders         Ordered    lisinopril (ZESTRIL) 20 MG tablet  Every evening     12/30/18 1716           Sandrika Schwinn, Ozella Almond, PA-C 12/30/18 1810    Fredia Sorrow, MD 01/08/19 [phone]

## 2018-12-31 LAB — SARS CORONAVIRUS 2 (TAT 6-24 HRS): SARS Coronavirus 2: NEGATIVE

## 2019-01-10 ENCOUNTER — Telehealth: Payer: Self-pay | Admitting: Internal Medicine

## 2019-01-10 NOTE — Telephone Encounter (Signed)
[facility] has called in regards to a medical records release they have sent over to [place].  Please Advise. Thanks  Ph # [phone] Fax# [phone]

## 2019-01-10 NOTE — Telephone Encounter (Signed)
All requests for medical records must go through the medical records department. I faxed the release to medical records and also faxed it back to the [facility] letting them know to follow up with Med Rec.

## 2019-02-26 ENCOUNTER — Telehealth: Payer: Managed Care, Other (non HMO) | Admitting: Family

## 2019-02-26 DIAGNOSIS — Z20822 Contact with and (suspected) exposure to covid-19: Secondary | ICD-10-CM

## 2019-02-26 NOTE — Progress Notes (Signed)
E-Visit for Corona Virus Screening   Your current symptoms could be consistent with the coronavirus.  Many health care providers can now test patients at their office but not all are.  [facility] has multiple testing sites. For information on our COVID testing locations and hours go to [email or web address]  Please quarantine yourself while awaiting your test results.  We are enrolling you in our [facility] for [place] . Daily you will receive a questionnaire within the [facility] website. Our COVID 19 response team willl be monitoriing your responses daily.  The testing center is open Monday-Friday 8-330pm, [address] [place]  COVID-19 is a respiratory illness with symptoms that are similar to the flu. Symptoms are typically mild to moderate, but there have been cases of severe illness and death due to the virus. The following symptoms may appear 2-14 days after exposure: . Fever . Cough . Shortness of breath or difficulty breathing . Chills . Repeated shaking with chills . Muscle pain . Headache . Sore throat . New loss of taste or smell . Fatigue . Congestion or runny nose . Nausea or vomiting . Diarrhea  It is vitally important that if you feel that you have an infection such as this virus or any other virus that you stay home and away from places where you may spread it to others.  You should self-quarantine for 14 days if you have symptoms that could potentially be coronavirus or have been in close contact a with a person diagnosed with COVID-19 within the last 2 weeks. You should avoid contact with people age 64 and older.   You should wear a mask or cloth face covering over your nose and mouth if you must be around other people or animals, including pets (even at home). Try to stay at least 6 feet away from other people. This will protect the people around you.   You may also take acetaminophen (Tylenol) as needed for  fever.   Reduce your risk of any infection by using the same precautions used for avoiding the common cold or flu:  [name] Wash your hands often with soap and warm water for at least 20 seconds.  If soap and water are not readily available, use an alcohol-based hand sanitizer with at least 60% alcohol.  . If coughing or sneezing, cover your mouth and nose by coughing or sneezing into the elbow areas of your shirt or coat, into a tissue or into your sleeve (not your hands). . Avoid shaking hands with others and consider head nods or verbal greetings only. . Avoid touching your eyes, nose, or mouth with unwashed hands.  . Avoid close contact with people who are sick. . Avoid places or events with large numbers of people in one location, like concerts or sporting events. . Carefully consider travel plans you have or are making. . If you are planning any travel outside or inside the [place], visit the CDC's Travelers' Health webpage for the latest health notices. . If you have some symptoms but not all symptoms, continue to monitor at home and seek medical attention if your symptoms worsen. . If you are having a medical emergency, call 911.  HOME CARE . Only take medications as instructed by your medical team. . Drink plenty of fluids and get plenty of rest. . A steam or ultrasonic humidifier can help if you have congestion.   GET HELP RIGHT AWAY IF YOU HAVE EMERGENCY WARNING SIGNS** FOR COVID-19. If you or  someone is showing any of these signs seek emergency medical care immediately. Call 911 or proceed to your closest emergency facility if: . You develop worsening high fever. . Trouble breathing . Bluish lips or face . Persistent pain or pressure in the chest . New confusion . Inability to wake or stay awake . You cough up blood. . Your symptoms become more severe  **This list is not all possible symptoms. Contact your medical provider for any symptoms that are sever or concerning to you.   MAKE  SURE YOU   Understand these instructions.  Will watch your condition.  Will get help right away if you are not doing well or get worse.  Your e-visit answers were reviewed by a board certified advanced clinical practitioner to complete your personal care plan.  Depending on the condition, your plan could have included both over the counter or prescription medications.  If there is a problem please reply once you have received a response from your provider.  Your safety is important to [place].  If you have drug allergies check your prescription carefully.    You can use MyChart to ask questions about today's visit, request a non-urgent call back, or ask for a work or school excuse for 24 hours related to this e-Visit. If it has been greater than 24 hours you will need to follow up with your provider, or enter a new e-Visit to address those concerns. You will get an e-mail in the next two days asking about your experience.  I hope that your e-visit has been valuable and will speed your recovery. Thank you for using e-visits.   Greater than 5 minutes, yet less than 10 minutes of time have been spent researching, coordinating, and implementing care for this patient today.  Thank you for the details you included in the comment boxes. Those details are very helpful in determining the best course of treatment for you and help [place] to provide the best care.

## 2019-06-24 ENCOUNTER — Other Ambulatory Visit: Payer: Self-pay

## 2019-06-24 ENCOUNTER — Ambulatory Visit (HOSPITAL_COMMUNITY)
Admission: EM | Admit: 2019-06-24 | Discharge: 2019-06-24 | Disposition: A | Discharge status: Home / Self Care | Payer: BC Managed Care – PPO | Attending: Emergency Medicine | Admitting: Emergency Medicine

## 2019-06-24 ENCOUNTER — Encounter (HOSPITAL_COMMUNITY): Payer: Self-pay

## 2019-06-24 DIAGNOSIS — I10 Essential (primary) hypertension: Secondary | ICD-10-CM | POA: Insufficient documentation

## 2019-06-24 DIAGNOSIS — R519 Headache, unspecified: Secondary | ICD-10-CM | POA: Diagnosis not present

## 2019-06-24 DIAGNOSIS — Z760 Encounter for issue of repeat prescription: Secondary | ICD-10-CM | POA: Insufficient documentation

## 2019-06-24 LAB — COMPREHENSIVE METABOLIC PANEL
ALT: 24 U/L (ref 0–44)
AST: 19 U/L (ref 15–41)
Albumin: 4 g/dL (ref 3.5–5.0)
Alkaline Phosphatase: 91 U/L (ref 38–126)
Anion gap: 9 (ref 5–15)
BUN: 12 mg/dL (ref 6–20)
CO2: 27 mmol/L (ref 22–32)
Calcium: 9.6 mg/dL (ref 8.9–10.3)
Chloride: 103 mmol/L (ref 98–111)
Creatinine, Ser: 0.96 mg/dL (ref 0.44–1.00)
GFR calc Af Amer: >60 mL/min (ref >60)
GFR calc non Af Amer: >60 mL/min (ref >60)
Glucose, Bld: 90 mg/dL (ref 70–99)
Potassium: 3.8 mmol/L (ref 3.5–5.1)
Sodium: 139 mmol/L (ref 135–145)
Total Bilirubin: 0.4 mg/dL (ref 0.3–1.2)
Total Protein: 7.5 g/dL (ref 6.5–8.1)

## 2019-06-24 MED ORDER — LISINOPRIL 20 MG PO TABS: 20.0000 mg | ORAL_TABLET | Freq: Every evening | ORAL | 0 refills | Status: DC

## 2019-06-24 NOTE — ED Triage Notes (Signed)
Pt presents with complaints of needing refill on her blood pressure medications lisinopril. Reports she has been out of her medication since August. Pt reports headache and nausea that started yesterday.

## 2019-06-24 NOTE — Discharge Instructions (Signed)
Please restart your blood pressure medication.  Tylenol as needed for headache.  See provided information about managing your hypertension.  Please call Monday to set up follow up and to establish care with a primary care provider as urgent care does not manage blood pressure medications long term.  If persistent or worsening of headache, chest pain , shortness of breath , vision changes, weakness or confusion please go to the ER.

## 2019-06-24 NOTE — ED Provider Notes (Signed)
[place] [facility]    CSN: [id number] Arrival date & time: 06/24/19  1613      History   Chief Complaint Chief Complaint  Patient presents with   Hypertension    HPI Tracy Johns is a 44 y.o. female.   Tracy Johns presents with complaints of elevated blood pressure. Hasn't taken her medications since July. History of hypertension. Has had some headache over the past few days, took her BP yesterday and it was systolic [id number]. Found a left over BP pill, it was expired, and took it last night but BP still 172/111. Sensation of a "boulder on (her) back." headache has improved. No dizziness, no chest pain . No new or worsening lower extremity edema. Some shortness of breath which is not new for her, history of smoking. Endorses weight gain over the past year as well. Yesterday had left shoulder pain, denies currently. Her mother died of MI and father had a CVA. Doesn't follow regularly with a pcp. History  Of migraines.     ROS per HPI, negative if not otherwise mentioned.      Past Medical History:  Diagnosis Date   Dyspnea on exertion    History of acute pyelonephritis 06/26/2017   w/ sepsis   History of kidney stones    per pt 2008 and passed spontenaously   History of pseudoseizure    07-07-2017 per had first episode seizure activity that told to her caused by extreme stress in 2012, 2015 and last one 2017;  per pt was told no medication needed   Hypercholesterolemia    Hyperprolactinemia [facility]) dx 2012   endocrinologist-  dr Cruzita Lederer   Hypertension    Migraines    Pituitary microadenoma [facility])    dx per MRI 06/ 2018   Wears glasses     Patient Active Problem List   Diagnosis Date Noted   Abnormal thyroid function test 10/06/2017   Nephrolithiasis 06/27/2017   Hypokalemia 06/27/2017   Hyponatremia 06/27/2017   Essential hypertension 06/27/2017   Sepsis ([place]) 06/27/2017   Pyelonephritis 06/26/2017   Empty sella ([place])  04/06/2017   Prolactinoma ([place]) 04/06/2017   High serum adrenocorticotropic hormone (ACTH) 04/06/2017   Hyperprolactinemia ([place]) 10/02/2016    Past Surgical History:  Procedure Laterality Date   CESAREAN SECTION  x2  last one 2000   Bilateral Tubal Ligation w/ last c/s   CYSTOSCOPY/RETROGRADE/URETEROSCOPY Right 06/26/2017   Procedure: CYSTOSCOPY RIGHT RETROGRADE RIGHT CYSTOSCOPY;  Surgeon: Festus Aloe, MD;  Location: [facility];  Service: Urology;  Laterality: Right;   CYSTOSCOPY/URETEROSCOPY/HOLMIUM LASER/STENT PLACEMENT Right 07/10/2017   Procedure: CYSTOSCOPY/URETEROSCOPY/HOLMIUM LASER/STENT EXCHANGE;  Surgeon: Festus Aloe, MD;  [facility];  Service: Urology;  Laterality: Right;    OB History   No obstetric history on file.      Home Medications    Prior to Admission medications   Medication Sig Start Date End Date Taking? Authorizing Provider  acetaminophen (TYLENOL) 500 MG tablet Take 500 mg by mouth every 6 (six) hours as needed.    [provider]  albuterol (VENTOLIN HFA) 108 (90 Base) MCG/ACT inhaler Inhale 2 puffs into the lungs every 6 (six) hours as needed for wheezing or shortness of breath.     [provider]  aspirin-acetaminophen-caffeine (EXCEDRIN MIGRAINE) [phone] MG tablet Take by mouth every 6 (six) hours as needed for headache.    [provider]  cabergoline (DOSTINEX) 0.5 MG tablet Take 0.5 tablets (0.25 mg total) by mouth once a week. 11/25/17  Philemon Kingdom, MD  doxycycline (VIBRAMYCIN) 100 MG capsule Take 1 capsule (100 mg total) by mouth 2 (two) times daily. One po bid x 7 days 01/09/18   Veryl Speak, MD  hydrochlorothiazide (HYDRODIURIL) 12.5 MG tablet Take 1 tablet (12.5 mg total) by mouth daily. Patient taking differently: Take 12.5 mg by mouth every evening.  07/10/16   Clent Demark, PA-C  lisinopril (ZESTRIL) 20 MG tablet Take 1 tablet (20 mg total) by mouth every evening.  06/24/19   Zigmund Gottron, NP    Family History Family History  Problem Relation Age of Onset   Hypertension Other    Stroke Other    Heart failure Other    Heart attack Mother    Stroke Father     Social History Social History   Tobacco Use   Smoking status: Current Every Day Smoker    Packs/day: 0.50    Years: 6.00    Pack years: 3.00    Types: Cigarettes   Smokeless tobacco: Never Used  Substance Use Topics   Alcohol use: Yes    Comment: hasnt drank since christmas   Drug use: No     Allergies   Imitrex [sumatriptan], Oxycodone, and Percocet [oxycodone-acetaminophen]   Review of Systems Review of Systems   Physical Exam Triage Vital Signs ED Triage Vitals  Enc Vitals Group     BP 06/24/19 1659 (!) 171/115     Pulse Rate 06/24/19 1659 78     Resp 06/24/19 1659 18     Temp 06/24/19 1659 98.2 F (36.8 C)     Temp src --      SpO2 06/24/19 1659 98 %     Weight --      Height --      Head Circumference --      Peak Flow --      Pain Score 06/24/19 1656 8     Pain Loc --      Pain Edu? --      Excl. in [place]? --    No data found.  Updated Vital Signs BP (!) 153/82 (BP Location: Right Arm)    Pulse 78    Temp 98.2 F (36.8 C)    Resp 18    SpO2 98%    Physical Exam Constitutional:      General: She is not in acute distress.    Appearance: She is well-developed.  Cardiovascular:     Rate and Rhythm: Normal rate and regular rhythm.  Pulmonary:     Effort: Pulmonary effort is normal.     Breath sounds: Normal breath sounds.  Musculoskeletal:     Right lower leg: No edema.     Left lower leg: No edema.  Skin:    General: Skin is warm and dry.  Neurological:     Mental Status: She is alert and oriented to person, place, and time.     EKG:  NSR rate of 63. Previous EKG was available for review.  noted new t- inversion to avl without any other changes noted to ekg.  UC Treatments / Results  Labs (all labs ordered are listed, but only  abnormal results are displayed) Labs Reviewed  COMPREHENSIVE METABOLIC PANEL    EKG   Radiology No results found.  Procedures Procedures (including critical care time)  Medications Ordered in UC Medications - No data to display  Initial Impression / Assessment and Plan / UC Course  I have reviewed the triage vital signs and  the nursing notes.  Pertinent labs & imaging results that were available during my care of the patient were reviewed by me and considered in my medical decision making (see chart for details).    Hasn't been on medications in months now with noted elevated bp. Headache, due to bp or related to migraines? Headache is improving. No other neurological symptoms or red flag findings. Blood pressure medication refilled tonight with baseline cmp obtained. bp with some improvement with rest her in UC. ekg reassuring tonight. Emphasized follow up with PCP for recheck and management. Return precautions provided. Patient verbalized understanding and agreeable to plan.   Final Clinical Impressions(s) / UC Diagnoses   Final diagnoses:  Hypertension, unspecified type  Acute nonintractable headache, unspecified headache type  Medication refill     Discharge Instructions     Please restart your blood pressure medication.  Tylenol as needed for headache.  See provided information about managing your hypertension.  Please call Monday to set up follow up and to establish care with a primary care provider as urgent care does not manage blood pressure medications long term.  If persistent or worsening of headache, chest pain , shortness of breath , vision changes, weakness or confusion please go to the ER.     ED Prescriptions    Medication Sig Dispense Auth. Provider   lisinopril (ZESTRIL) 20 MG tablet Take 1 tablet (20 mg total) by mouth every evening. 30 tablet Zigmund Gottron, NP     PDMP not reviewed this encounter.   Zigmund Gottron, NP 06/24/19 1900

## 2019-07-04 ENCOUNTER — Encounter: Payer: Self-pay | Admitting: Family Medicine

## 2019-07-04 ENCOUNTER — Other Ambulatory Visit: Payer: Self-pay

## 2019-07-04 ENCOUNTER — Ambulatory Visit (INDEPENDENT_AMBULATORY_CARE_PROVIDER_SITE_OTHER): Payer: BC Managed Care – PPO | Admitting: Family Medicine

## 2019-07-04 VITALS — BP 138/88 | HR 80 | Temp 97.3°F | Ht 62.0 in | Wt 216.2 lb

## 2019-07-04 DIAGNOSIS — I10 Essential (primary) hypertension: Secondary | ICD-10-CM

## 2019-07-04 DIAGNOSIS — Z720 Tobacco use: Secondary | ICD-10-CM | POA: Diagnosis not present

## 2019-07-04 DIAGNOSIS — F418 Other specified anxiety disorders: Secondary | ICD-10-CM

## 2019-07-04 LAB — BASIC METABOLIC PANEL
BUN: 11 mg/dL (ref 6–23)
CO2: 26 mEq/L (ref 19–32)
Calcium: 9.8 mg/dL (ref 8.4–10.5)
Chloride: 102 mEq/L (ref 96–112)
Creatinine, Ser: 0.94 mg/dL (ref 0.40–1.20)
GFR: 78.09 mL/min (ref >60.00)
Glucose, Bld: 121 mg/dL — ABNORMAL HIGH (ref 70–99)
Potassium: 4.2 mEq/L (ref 3.5–5.1)
Sodium: 140 mEq/L (ref 135–145)

## 2019-07-04 MED ORDER — FLUOXETINE HCL 10 MG PO CAPS: ORAL_CAPSULE | ORAL | 1 refills | Status: DC

## 2019-07-04 MED ORDER — LISINOPRIL 20 MG PO TABS: 20.0000 mg | ORAL_TABLET | Freq: Every evening | ORAL | 0 refills | Status: DC

## 2019-07-04 NOTE — Progress Notes (Signed)
New Patient Office Visit  Subjective:  Patient ID: Tracy Johns, female    DOB: [birth year]  Age: 44 y.o. MRN: [id number]  CC:  Chief Complaint  Patient presents with  . Establish Care    New patient, evaluation for hypertension concerns about weight, anxiety and depression.     HPI Tracy Johns presents for establishment of care and follow-up of her longstanding history of hypertension, anxiety and depression.  Have been treated for depression and anxiety in her distant past but has been lost to follow-up.  The same is true of her hypertension.  Recently had developed headache and was seen in the emergency room.  She was started on Zestril at that time and was advised to follow-up with her new PCP.  History of depression with anxiety since 1994.  Had been diagnosed with postpartum depression.  In and out of treatment since that time.  He is currently seeing a Social worker.  She works for [employer] from home.  Primary caregiver for her husband who is a type I diabetic is well as her 79 year old daughter who is a type I diabetic.  Daughter and husband not related.  She also deals with low back pain.  She is currently taking Tylenol and Advil.  Past Medical History:  Diagnosis Date  . Dyspnea on exertion   . History of acute pyelonephritis 06/26/2017   w/ sepsis  . History of kidney stones    per pt 2008 and passed spontenaously  . History of pseudoseizure    07-07-2017 per had first episode seizure activity that told to her caused by extreme stress in 2012, 2015 and last one 2017;  per pt was told no medication needed  . Hypercholesterolemia   . Hyperprolactinemia [facility]) dx 2012   endocrinologist-  dr Cruzita Lederer  . Hypertension   . Migraines   . Pituitary microadenoma ([place])    dx per MRI 06/ 2018  . Wears glasses     Past Surgical History:  Procedure Laterality Date  . CESAREAN SECTION  x2  last one 2000   Bilateral Tubal Ligation w/ last c/s  .  CYSTOSCOPY/RETROGRADE/URETEROSCOPY Right 06/26/2017   Procedure: CYSTOSCOPY RIGHT RETROGRADE RIGHT CYSTOSCOPY;  Surgeon: Festus Aloe, MD;  [facility];  Service: Urology;  Laterality: Right;  . CYSTOSCOPY/URETEROSCOPY/HOLMIUM LASER/STENT PLACEMENT Right 07/10/2017   Procedure: CYSTOSCOPY/URETEROSCOPY/HOLMIUM LASER/STENT EXCHANGE;  Surgeon: Festus Aloe, MD;  Location: [facility];  Service: Urology;  Laterality: Right;    Family History  Problem Relation Age of Onset  . Hypertension Other   . Stroke Other   . Heart failure Other   . Heart attack Mother   . Stroke Father     Social History   Socioeconomic History  . Marital status: Married    Spouse name: Not on file  . Number of children: Not on file  . Years of education: Not on file  . Highest education level: Not on file  Occupational History  . Not on file  Tobacco Use  . Smoking status: Current Every Day Smoker    Packs/day: 0.50    Years: 6.00    Pack years: 3.00    Types: Cigarettes  . Smokeless tobacco: Never Used  Substance and Sexual Activity  . Alcohol use: Yes    Comment: occa  . Drug use: No  . Sexual activity: Yes  Other Topics Concern  . Not on file  Social History Narrative  . Not on file   Social Determinants of Health  Financial Resource Strain:   . Difficulty of Paying Living Expenses: Not on file  Food Insecurity:   . Worried About Charity fundraiser in the Last Year: Not on file  . Ran Out of Food in the Last Year: Not on file  Transportation Needs:   . Lack of Transportation (Medical): Not on file  . Lack of Transportation (Non-Medical): Not on file  Physical Activity:   . Days of Exercise per Week: Not on file  . Minutes of Exercise per Session: Not on file  Stress:   . Feeling of Stress : Not on file  Social Connections:   . Frequency of Communication with Friends and Family: Not on file  . Frequency of Social Gatherings with Friends and Family: Not on  file  . Attends Religious Services: Not on file  . Active Member of Clubs or Organizations: Not on file  . Attends Archivist Meetings: Not on file  . Marital Status: Not on file  Intimate Partner Violence:   . Fear of Current or Ex-Partner: Not on file  . Emotionally Abused: Not on file  . Physically Abused: Not on file  . Sexually Abused: Not on file    ROS Review of Systems  Constitutional: Negative.   HENT: Negative.   Eyes: Negative for photophobia and visual disturbance.  Respiratory: Negative.   Cardiovascular: Negative.   Gastrointestinal: Negative.   Endocrine: Negative for polyphagia and polyuria.  Genitourinary: Negative.   Musculoskeletal: Positive for back pain.  Allergic/Immunologic: Negative for immunocompromised state.  Neurological: Negative for speech difficulty and light-headedness.  Hematological: Negative.   Psychiatric/Behavioral: Positive for dysphoric mood. Negative for self-injury. The patient is nervous/anxious.    Depression screen [facility] 2/9 07/04/2019 07/04/2019 07/07/2017  Decreased Interest 2 1 1   Down, Depressed, Hopeless 2 2 2   PHQ - 2 Score 4 3 3   Altered sleeping 0 - 2  Tired, decreased energy 3 - 2  Change in appetite 2 - 3  Feeling bad or failure about yourself  2 - 1  Trouble concentrating 1 - 1  Moving slowly or fidgety/restless 0 - 1  Suicidal thoughts 0 - 0  PHQ-9 Score 12 - 13     Objective:   Today's Vitals: BP 138/88   Pulse 80   Temp (!) 97.3 F (36.3 C) (Tympanic)   Ht 5\' 2"  (1.575 m)   Wt 216 lb 3.2 oz (98.1 kg)   SpO2 97%   BMI 39.54 kg/m   Physical Exam Vitals and nursing note reviewed.  Constitutional:      General: She is not in acute distress.    Appearance: Normal appearance. She is obese. She is not ill-appearing, toxic-appearing or diaphoretic.  HENT:     Head: Normocephalic and atraumatic.     Right Ear: External ear normal.     Left Ear: External ear normal.  Eyes:     General: No scleral  icterus.       Right eye: No discharge.        Left eye: No discharge.     Extraocular Movements: Extraocular movements intact.     Conjunctiva/sclera: Conjunctivae normal.     Pupils: Pupils are equal, round, and reactive to light.  Cardiovascular:     Rate and Rhythm: Normal rate and regular rhythm.  Pulmonary:     Effort: Pulmonary effort is normal.  Musculoskeletal:     Cervical back: No tenderness.     Right lower leg: No edema.  Left lower leg: No edema.  Lymphadenopathy:     Cervical: No cervical adenopathy.  Skin:    General: Skin is warm and dry.  Neurological:     Mental Status: She is alert and oriented to person, place, and time.  Psychiatric:        Mood and Affect: Mood normal.        Behavior: Behavior normal.     Assessment & Plan:   Problem List Items Addressed This Visit      Cardiovascular and Mediastinum   Essential hypertension - Primary (Chronic)   Relevant Medications   lisinopril (ZESTRIL) 20 MG tablet   Other Relevant Orders   Basic metabolic panel     Other   Depression with anxiety   Relevant Medications   FLUoxetine (PROZAC) 10 MG capsule   Tobacco use    Other Visit Diagnoses    Hypertension, unspecified type       Relevant Medications   lisinopril (ZESTRIL) 20 MG tablet      Outpatient Encounter Medications as of 07/04/2019  Medication Sig  . acetaminophen (TYLENOL) 500 MG tablet Take 500 mg by mouth every 6 (six) hours as needed.  [name] albuterol (VENTOLIN HFA) 108 (90 Base) MCG/ACT inhaler Inhale 2 puffs into the lungs every 6 (six) hours as needed for wheezing or shortness of breath.   [name] aspirin-acetaminophen-caffeine (EXCEDRIN MIGRAINE) 250-250-65 MG tablet Take by mouth every 6 (six) hours as needed for headache.  . lisinopril (ZESTRIL) 20 MG tablet Take 1 tablet (20 mg total) by mouth every evening.  . [DISCONTINUED] lisinopril (ZESTRIL) 20 MG tablet Take 1 tablet (20 mg total) by mouth every evening.  [name] FLUoxetine (PROZAC) 10  MG capsule Take one tablet daily for one week and then take 2 daily.  . [DISCONTINUED] cabergoline (DOSTINEX) 0.5 MG tablet Take 0.5 tablets (0.25 mg total) by mouth once a week. (Patient not taking: Reported on 07/04/2019)  . [DISCONTINUED] doxycycline (VIBRAMYCIN) 100 MG capsule Take 1 capsule (100 mg total) by mouth 2 (two) times daily. One po bid x 7 days (Patient not taking: Reported on 07/04/2019)  . [DISCONTINUED] hydrochlorothiazide (HYDRODIURIL) 12.5 MG tablet Take 1 tablet (12.5 mg total) by mouth daily. (Patient not taking: Reported on 07/04/2019)   No facility-administered encounter medications on file as of 07/04/2019.    Follow-up: Return in about 1 month (around 08/01/2019).   Patient was advised to quit smoking and she was given information on steps to quit smoking.  Also given information on managing her blood pressure.  We will continue Zestril for now.  Follow-up BMP today.  Libby Maw, MD

## 2019-07-04 NOTE — Patient Instructions (Signed)
Coping with Quitting Smoking  Quitting smoking is a physical and mental challenge. You will face cravings, withdrawal symptoms, and temptation. Before quitting, work with your health care provider to make a plan that can help you cope. Preparation can help you quit and keep you from giving in. How can I cope with cravings? Cravings usually last for 5-10 minutes. If you get through it, the craving will pass. Consider taking the following actions to help you cope with cravings:  Keep your mouth busy: ? Chew sugar-free gum. ? Suck on hard candies or a straw. ? Brush your teeth.  Keep your hands and body busy: ? Immediately change to a different activity when you feel a craving. ? Squeeze or play with a ball. ? Do an activity or a hobby, like making bead jewelry, practicing needlepoint, or working with wood. ? Mix up your normal routine. ? Take a short exercise break. Go for a quick walk or run up and down stairs. ? Spend time in public places where smoking is not allowed.  Focus on doing something kind or helpful for someone else.  Call a friend or family member to talk during a craving.  Join a support group.  Call a quit line, such as 1-800-QUIT-NOW.  Talk with your health care provider about medicines that might help you cope with cravings and make quitting easier for you. How can I deal with withdrawal symptoms? Your body may experience negative effects as it tries to get used to not having nicotine in the system. These effects are called withdrawal symptoms. They may include:  Feeling hungrier than normal.  Trouble concentrating.  Irritability.  Trouble sleeping.  Feeling depressed.  Restlessness and agitation.  Craving a cigarette. To manage withdrawal symptoms:  Avoid places, people, and activities that trigger your cravings.  Remember why you want to quit.  Get plenty of sleep.  Avoid coffee and other caffeinated drinks. These may worsen some of your  symptoms. How can I handle social situations? Social situations can be difficult when you are quitting smoking, especially in the first few weeks. To manage this, you can:  Avoid parties, bars, and other social situations where people might be smoking.  Avoid alcohol.  Leave right away if you have the urge to smoke.  Explain to your family and friends that you are quitting smoking. Ask for understanding and support.  Plan activities with friends or family where smoking is not an option. What are some ways I can cope with stress? Wanting to smoke may cause stress, and stress can make you want to smoke. Find ways to manage your stress. Relaxation techniques can help. For example:  Breathe slowly and deeply, in through your nose and out through your mouth.  Listen to soothing, relaxing music.  Talk with a family member or friend about your stress.  Light a candle.  Soak in a bath or take a shower.  Think about a peaceful place. What are some ways I can prevent weight gain? Be aware that many people gain weight after they quit smoking. However, not everyone does. To keep from gaining weight, have a plan in place before you quit and stick to the plan after you quit. Your plan should include:  Having healthy snacks. When you have a craving, it may help to: ? Eat plain popcorn, crunchy carrots, celery, or other cut vegetables. ? Chew sugar-free gum.  Changing how you eat: ? Eat small portion sizes at meals. ? Eat 4-6 small meals   throughout the day instead of 1-2 large meals a day. ? Be mindful when you eat. Do not watch television or do other things that might distract you as you eat.  Exercising regularly: ? Make time to exercise each day. If you do not have time for a long workout, do short bouts of exercise for 5-10 minutes several times a day. ? Do some form of strengthening exercise, like weight lifting, and some form of aerobic exercise, like running or swimming.  Drinking  plenty of water or other low-calorie or no-calorie drinks. Drink 6-8 glasses of water daily, or as much as instructed by your health care provider. Summary  Quitting smoking is a physical and mental challenge. You will face cravings, withdrawal symptoms, and temptation to smoke again. Preparation can help you as you go through these challenges.  You can cope with cravings by keeping your mouth busy (such as by chewing gum), keeping your body and hands busy, and making calls to family, friends, or a helpline for people who want to quit smoking.  You can cope with withdrawal symptoms by avoiding places where people smoke, avoiding drinks with caffeine, and getting plenty of rest.  Ask your health care provider about the different ways to prevent weight gain, avoid stress, and handle social situations. This information is not intended to replace advice given to you by your health care provider. Make sure you discuss any questions you have with your health care provider. Document Revised: 04/10/2017 Document Reviewed: 04/25/2016 Elsevier Patient Education  2020 [employer].  Managing Your Hypertension Hypertension is commonly called high blood pressure. This is when the force of your blood pressing against the walls of your arteries is too strong. Arteries are blood vessels that carry blood from your heart throughout your body. Hypertension forces the heart to work harder to pump blood, and may cause the arteries to become narrow or stiff. Having untreated or uncontrolled hypertension can cause heart attack, stroke, kidney disease, and other problems. What are blood pressure readings? A blood pressure reading consists of a higher number over a lower number. Ideally, your blood pressure should be below 120/80. The first ("top") number is called the systolic pressure. It is a measure of the pressure in your arteries as your heart beats. The second ("bottom") number is called the diastolic pressure. It  is a measure of the pressure in your arteries as the heart relaxes. What does my blood pressure reading mean? Blood pressure is classified into four stages. Based on your blood pressure reading, your health care provider may use the following stages to determine what type of treatment you need, if any. Systolic pressure and diastolic pressure are measured in a unit called mm Hg. Normal  Systolic pressure: below 655.  Diastolic pressure: below 80. Elevated  Systolic pressure: 374-827.  Diastolic pressure: below 80. Hypertension stage 1  Systolic pressure: 078-675.  Diastolic pressure: 44-92. Hypertension stage 2  Systolic pressure: 010 or above.  Diastolic pressure: 90 or above. What health risks are associated with hypertension? Managing your hypertension is an important responsibility. Uncontrolled hypertension can lead to:  A heart attack.  A stroke.  A weakened blood vessel (aneurysm).  Heart failure.  Kidney damage.  Eye damage.  Metabolic syndrome.  Memory and concentration problems. What changes can I make to manage my hypertension? Hypertension can be managed by making lifestyle changes and possibly by taking medicines. Your health care provider will help you make a plan to bring your blood pressure within  a normal range. Eating and drinking   Eat a diet that is high in fiber and potassium, and low in salt (sodium), added sugar, and fat. An example eating plan is called the DASH (Dietary Approaches to Stop Hypertension) diet. To eat this way: ? Eat plenty of fresh fruits and vegetables. Try to fill half of your plate at each meal with fruits and vegetables. ? Eat whole grains, such as whole wheat pasta, brown rice, or whole grain bread. Fill about one quarter of your plate with whole grains. ? Eat low-fat diary products. ? Avoid fatty cuts of meat, processed or cured meats, and poultry with skin. Fill about one quarter of your plate with lean proteins such as  fish, chicken without skin, beans, eggs, and tofu. ? Avoid premade and processed foods. These tend to be higher in sodium, added sugar, and fat.  Reduce your daily sodium intake. Most people with hypertension should eat less than 1,500 mg of sodium a day.  Limit alcohol intake to no more than 1 drink a day for nonpregnant women and 2 drinks a day for men. One drink equals 12 oz of beer, 5 oz of wine, or 1 oz of hard liquor. Lifestyle  Work with your health care provider to maintain a healthy body weight, or to lose weight. Ask what an ideal weight is for you.  Get at least 30 minutes of exercise that causes your heart to beat faster (aerobic exercise) most days of the week. Activities may include walking, swimming, or biking.  Include exercise to strengthen your muscles (resistance exercise), such as weight lifting, as part of your weekly exercise routine. Try to do these types of exercises for 30 minutes at least 3 days a week.  Do not use any products that contain nicotine or tobacco, such as cigarettes and e-cigarettes. If you need help quitting, ask your health care provider.  Control any long-term (chronic) conditions you have, such as high cholesterol or diabetes. Monitoring  Monitor your blood pressure at home as told by your health care provider. Your personal target blood pressure may vary depending on your medical conditions, your age, and other factors.  Have your blood pressure checked regularly, as often as told by your health care provider. Working with your health care provider  Review all the medicines you take with your health care provider because there may be side effects or interactions.  Talk with your health care provider about your diet, exercise habits, and other lifestyle factors that may be contributing to hypertension.  Visit your health care provider regularly. Your health care provider can help you create and adjust your plan for managing hypertension. Will  I need medicine to control my blood pressure? Your health care provider may prescribe medicine if lifestyle changes are not enough to get your blood pressure under control, and if:  Your systolic blood pressure is 130 or higher.  Your diastolic blood pressure is 80 or higher. Take medicines only as told by your health care provider. Follow the directions carefully. Blood pressure medicines must be taken as prescribed. The medicine does not work as well when you skip doses. Skipping doses also puts you at risk for problems. Contact a health care provider if:  You think you are having a reaction to medicines you have taken.  You have repeated (recurrent) headaches.  You feel dizzy.  You have swelling in your ankles.  You have trouble with your vision. Get help right away if:  You develop  a severe headache or confusion.  You have unusual weakness or numbness, or you feel faint.  You have severe pain in your chest or abdomen.  You vomit repeatedly.  You have trouble breathing. Summary  Hypertension is when the force of blood pumping through your arteries is too strong. If this condition is not controlled, it may put you at risk for serious complications.  Your personal target blood pressure may vary depending on your medical conditions, your age, and other factors. For most people, a normal blood pressure is less than 120/80.  Hypertension is managed by lifestyle changes, medicines, or both. Lifestyle changes include weight loss, eating a healthy, low-sodium diet, exercising more, and limiting alcohol. This information is not intended to replace advice given to you by your health care provider. Make sure you discuss any questions you have with your health care provider. Document Revised: 08/20/2018 Document Reviewed: 03/26/2016 Elsevier Patient Education  2020 Elsevier Inc.  

## 2019-07-07 ENCOUNTER — Encounter: Payer: Self-pay | Admitting: Family Medicine

## 2019-07-07 DIAGNOSIS — I10 Essential (primary) hypertension: Secondary | ICD-10-CM

## 2019-07-08 MED ORDER — TELMISARTAN 40 MG PO TABS: 40.0000 mg | ORAL_TABLET | Freq: Every day | ORAL | 2 refills | Status: DC

## 2019-07-11 ENCOUNTER — Other Ambulatory Visit: Payer: Self-pay

## 2019-07-11 DIAGNOSIS — F418 Other specified anxiety disorders: Secondary | ICD-10-CM

## 2019-07-11 MED ORDER — FLUOXETINE HCL 20 MG PO CAPS: ORAL_CAPSULE | ORAL | 1 refills | Status: DC

## 2019-08-01 ENCOUNTER — Ambulatory Visit: Payer: BC Managed Care – PPO | Admitting: Family Medicine

## 2019-08-01 DIAGNOSIS — Z0289 Encounter for other administrative examinations: Secondary | ICD-10-CM

## 2019-08-07 ENCOUNTER — Other Ambulatory Visit: Payer: Self-pay | Admitting: Family Medicine

## 2019-08-07 DIAGNOSIS — I10 Essential (primary) hypertension: Secondary | ICD-10-CM

## 2019-08-08 ENCOUNTER — Other Ambulatory Visit: Payer: Self-pay

## 2019-08-08 ENCOUNTER — Encounter: Payer: Self-pay | Admitting: Family Medicine

## 2019-08-08 DIAGNOSIS — I10 Essential (primary) hypertension: Secondary | ICD-10-CM

## 2019-08-08 MED ORDER — TELMISARTAN 40 MG PO TABS: 40.0000 mg | ORAL_TABLET | Freq: Every day | ORAL | 0 refills | Status: DC

## 2019-08-16 ENCOUNTER — Other Ambulatory Visit: Payer: Self-pay

## 2019-08-17 ENCOUNTER — Encounter: Payer: Self-pay | Admitting: Family Medicine

## 2019-08-17 ENCOUNTER — Ambulatory Visit (INDEPENDENT_AMBULATORY_CARE_PROVIDER_SITE_OTHER): Payer: BC Managed Care – PPO | Admitting: Family Medicine

## 2019-08-17 VITALS — BP 126/80 | HR 88 | Temp 97.2°F | Ht 62.0 in | Wt 212.4 lb

## 2019-08-17 DIAGNOSIS — R7309 Other abnormal glucose: Secondary | ICD-10-CM | POA: Insufficient documentation

## 2019-08-17 DIAGNOSIS — F418 Other specified anxiety disorders: Secondary | ICD-10-CM

## 2019-08-17 DIAGNOSIS — I10 Essential (primary) hypertension: Secondary | ICD-10-CM | POA: Diagnosis not present

## 2019-08-17 DIAGNOSIS — M545 Low back pain, unspecified: Secondary | ICD-10-CM | POA: Insufficient documentation

## 2019-08-17 LAB — BASIC METABOLIC PANEL
BUN: 12 mg/dL (ref 6–23)
CO2: 28 mEq/L (ref 19–32)
Calcium: 9.5 mg/dL (ref 8.4–10.5)
Chloride: 104 mEq/L (ref 96–112)
Creatinine, Ser: 0.92 mg/dL (ref 0.40–1.20)
GFR: 80.01 mL/min (ref >60.00)
Glucose, Bld: 119 mg/dL — ABNORMAL HIGH (ref 70–99)
Potassium: 4 mEq/L (ref 3.5–5.1)
Sodium: 139 mEq/L (ref 135–145)

## 2019-08-17 LAB — HEMOGLOBIN A1C: Hgb A1c MFr Bld: 5.5 % (ref 4.6–6.5)

## 2019-08-17 MED ORDER — METHOCARBAMOL 500 MG PO TABS: 500.0000 mg | ORAL_TABLET | Freq: Three times a day (TID) | ORAL | 0 refills | Status: DC | PRN
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
## 2019-08-29 ENCOUNTER — Ambulatory Visit: Payer: BC Managed Care – PPO | Admitting: Family Medicine

## 2019-08-29 DIAGNOSIS — Z0289 Encounter for other administrative examinations: Secondary | ICD-10-CM

## 2019-08-29 NOTE — Progress Notes (Deleted)
   Subjective:    I'm seeing this patient as a consultation for:  Tracy Maw, MD. Note will be routed back to referring provider/PCP.   CC: Left low back pain  HPI: Patient is a 44 year old female who presents today with L loawer back pain that has been going on X1 year . Patient rates pain as a /10 and describes the pain as . Patient sometimes experiences spasms. She denies any weakness paresthesias or change in her bowel or bladder function.  Radiates: yes to L upper buttock and L thigh Numbness/tingling: LLE weakness: no Tried: Alternating Aspirin and Tylenol  Aggravating factors:   Past medical history, Surgical history, Family history, Social history, Allergies, and medications have been entered into the medical record, reviewed. ***  Review of Systems: No new headache, visual changes, nausea, vomiting, diarrhea, constipation, dizziness, abdominal pain, skin rash, fevers, chills, night sweats, weight loss, swollen lymph nodes, body aches, joint swelling, muscle aches, chest pain, shortness of breath, mood changes, visual or auditory hallucinations.   Objective:   There were no vitals filed for this visit. General: Well Developed, well nourished, and in no acute distress.  Neuro/Psych: Alert and oriented x3, extra-ocular muscles intact, able to move all 4 extremities, sensation grossly intact. Skin: Warm and dry, no rashes noted.  Respiratory: Not using accessory muscles, speaking in full sentences, trachea midline.  Cardiovascular: Pulses palpable, no extremity edema. Abdomen: Does not appear distended. MSK: ***  Lab and Radiology Results No results found for this or any previous visit (from the past 72 hour(s)). No results found.  Impression and Recommendations:    Assessment and Plan: 44 y.o. female with ***.  PDMP not reviewed this encounter. No orders of the defined types were placed in this encounter.  No orders of the defined types were placed in this  encounter.   Discussed warning signs or symptoms. Please see discharge instructions. Patient expresses understanding.   ***

## 2019-09-06 ENCOUNTER — Other Ambulatory Visit: Payer: Self-pay | Admitting: Family Medicine

## 2019-09-06 DIAGNOSIS — I10 Essential (primary) hypertension: Secondary | ICD-10-CM

## 2019-09-16 ENCOUNTER — Ambulatory Visit: Payer: BC Managed Care – PPO | Admitting: Family Medicine

## 2019-09-19 ENCOUNTER — Other Ambulatory Visit: Payer: Self-pay

## 2019-09-19 ENCOUNTER — Ambulatory Visit (INDEPENDENT_AMBULATORY_CARE_PROVIDER_SITE_OTHER): Payer: BC Managed Care – PPO | Admitting: Family Medicine

## 2019-09-19 ENCOUNTER — Encounter: Payer: Self-pay | Admitting: Family Medicine

## 2019-09-19 VITALS — BP 132/88 | HR 68 | Temp 97.0°F | Ht 62.0 in | Wt 210.9 lb

## 2019-09-19 DIAGNOSIS — M545 Low back pain, unspecified: Secondary | ICD-10-CM

## 2019-09-19 DIAGNOSIS — I10 Essential (primary) hypertension: Secondary | ICD-10-CM

## 2019-09-19 DIAGNOSIS — F418 Other specified anxiety disorders: Secondary | ICD-10-CM | POA: Diagnosis not present

## 2019-09-19 DIAGNOSIS — J45909 Unspecified asthma, uncomplicated: Secondary | ICD-10-CM | POA: Insufficient documentation

## 2019-09-19 DIAGNOSIS — J4520 Mild intermittent asthma, uncomplicated: Secondary | ICD-10-CM | POA: Diagnosis not present

## 2019-09-19 MED ORDER — FLUOXETINE HCL 20 MG PO CAPS: ORAL_CAPSULE | ORAL | 1 refills | Status: DC

## 2019-09-19 MED ORDER — TELMISARTAN 40 MG PO TABS: ORAL_TABLET | ORAL | 1 refills | Status: DC

## 2019-09-19 MED ORDER — ALBUTEROL SULFATE HFA 108 (90 BASE) MCG/ACT IN AERS: 1.0000 | INHALATION_SPRAY | Freq: Four times a day (QID) | RESPIRATORY_TRACT | 0 refills | Status: DC | PRN

## 2019-09-19 NOTE — Progress Notes (Signed)
Established Patient Office Visit  Subjective:  Patient ID: Tracy Johns, female    DOB: 1975-11-22  Age: 44 y.o. MRN: [id number]  CC:  Chief Complaint  Patient presents with  . Follow-up    1 month follow up on BP and medications, no concerns.     HPI Tracy Johns presents for follow-up of her depression and anxiety that is greatly improved with the Prozac.  Definitely feels an elevation in mood.  The edges been taken off of her anxiousness.  Work continues to be stressful but she is able to cope more so than before.  Taking Prozac at night because it seems to work better for her that way.  Requests a refill of her albuterol inhaler.  She uses it sometimes when she exercises or is out with the pollen.  Continues to assist her daughter and husband who both have type 1 diabetes.  Blood pressures been well controlled with the Telmisartan already she is tolerating the drug well.  Has follow-up appointment with sports medicine soon.  Past Medical History:  Diagnosis Date  . Dyspnea on exertion   . History of acute pyelonephritis 06/26/2017   w/ sepsis  . History of kidney stones    per pt 2008 and passed spontenaously  . History of pseudoseizure    07-07-2017 per had first episode seizure activity that told to her caused by extreme stress in 2012, 2015 and last one 2017;  per pt was told no medication needed  . Hypercholesterolemia   . Hyperprolactinemia [facility]) dx 2012   endocrinologist-  dr Cruzita Lederer  . Hypertension   . Migraines   . Pituitary microadenoma ([place])    dx per MRI 06/ 2018  . Wears glasses     Past Surgical History:  Procedure Laterality Date  . CESAREAN SECTION  x2  last one 2000   Bilateral Tubal Ligation w/ last c/s  . CYSTOSCOPY/RETROGRADE/URETEROSCOPY Right 06/26/2017   Procedure: CYSTOSCOPY RIGHT RETROGRADE RIGHT CYSTOSCOPY;  Surgeon: Festus Aloe, MD;  Location: [facility];  Service: Urology;  Laterality: Right;  .  CYSTOSCOPY/URETEROSCOPY/HOLMIUM LASER/STENT PLACEMENT Right 07/10/2017   Procedure: CYSTOSCOPY/URETEROSCOPY/HOLMIUM LASER/STENT EXCHANGE;  Surgeon: Festus Aloe, MD;  Location: [facility];  Service: Urology;  Laterality: Right;    Family History  Problem Relation Age of Onset  . Hypertension Other   . Stroke Other   . Heart failure Other   . Heart attack Mother   . Stroke Father     Social History   Socioeconomic History  . Marital status: Married    Spouse name: Not on file  . Number of children: Not on file  . Years of education: Not on file  . Highest education level: Not on file  Occupational History  . Not on file  Tobacco Use  . Smoking status: Current Every Day Smoker    Packs/day: 0.50    Years: 6.00    Pack years: 3.00    Types: Cigarettes  . Smokeless tobacco: Never Used  Substance and Sexual Activity  . Alcohol use: Yes    Comment: occa  . Drug use: No  . Sexual activity: Yes  Other Topics Concern  . Not on file  Social History Narrative  . Not on file   Social Determinants of Health   Financial Resource Strain:   . Difficulty of Paying Living Expenses:   Food Insecurity:   . Worried About Charity fundraiser in the Last Year:   . [employer]  in the Last Year:   Transportation Needs:   . Film/video editor (Medical):   [name] Lack of Transportation (Non-Medical):   Physical Activity:   . Days of Exercise per Week:   . Minutes of Exercise per Session:   Stress:   . Feeling of Stress :   Social Connections:   . Frequency of Communication with Friends and Family:   . Frequency of Social Gatherings with Friends and Family:   . Attends Religious Services:   . Active Member of Clubs or Organizations:   . Attends Archivist Meetings:   [name] Marital Status:   Intimate Partner Violence:   . Fear of Current or Ex-Partner:   . Emotionally Abused:   [name] Physically Abused:   . Sexually Abused:     Outpatient Medications  Prior to Visit  Medication Sig Dispense Refill  . acetaminophen (TYLENOL) 500 MG tablet Take 500 mg by mouth every 6 (six) hours as needed.    . methocarbamol (ROBAXIN) 500 MG tablet Take 1 tablet (500 mg total) by mouth every 8 (eight) hours as needed for muscle spasms. 40 tablet 0  . albuterol (VENTOLIN HFA) 108 (90 Base) MCG/ACT inhaler Inhale 2 puffs into the lungs every 6 (six) hours as needed for wheezing or shortness of breath.     [name] FLUoxetine (PROZAC) 20 MG capsule Take 1qd (stop 10mg ) 30 capsule 1  . telmisartan (MICARDIS) 40 MG tablet TAKE 1 TABLET(40 MG) BY MOUTH DAILY 90 tablet 1  . aspirin-acetaminophen-caffeine (EXCEDRIN MIGRAINE) [id number] MG tablet Take by mouth every 6 (six) hours as needed for headache.     No facility-administered medications prior to visit.    Allergies  Allergen Reactions  . Imitrex [Sumatriptan] Nausea And Vomiting  . Oxycodone Itching    Percocet "feel extremely itching and skin burns"  . Percocet [Oxycodone-Acetaminophen]     itching    ROS Review of Systems  Constitutional: Negative.   HENT: Negative.   Eyes: Negative for photophobia and visual disturbance.  Respiratory: Negative for chest tightness, shortness of breath and wheezing.   Cardiovascular: Negative.   Gastrointestinal: Negative.   Endocrine: Negative for polyphagia and polyuria.  Genitourinary: Negative.   Musculoskeletal: Negative.   Allergic/Immunologic: Negative for immunocompromised state.  Neurological: Negative for tremors and speech difficulty.  Hematological: Does not bruise/bleed easily.   Depression screen [facility] 2/9 09/19/2019 08/17/2019 07/04/2019  Decreased Interest 1 2 2   Down, Depressed, Hopeless 1 2 2   PHQ - 2 Score 2 4 4   Altered sleeping 0 3 0  Tired, decreased energy 2 2 3   Change in appetite 1 1 2   Feeling bad or failure about yourself  1 1 2   Trouble concentrating 2 2 1   Moving slowly or fidgety/restless 0 0 0  Suicidal thoughts 0 0 0  PHQ-9 Score 8  13 12   Difficult doing work/chores Somewhat difficult Somewhat difficult -      Objective:    Physical Exam  BP 132/88   Pulse 68   Temp (!) 97 F (36.1 C) (Tympanic)   Ht 5\' 2"  (1.575 m)   Wt 210 lb 14.4 oz (95.7 kg)   SpO2 100%   BMI 38.57 kg/m  Wt Readings from Last 3 Encounters:  09/19/19 210 lb 14.4 oz (95.7 kg)  08/17/19 212 lb 6.4 oz (96.3 kg)  07/04/19 216 lb 3.2 oz (98.1 kg)     Health Maintenance Due  Topic Date Due  . COVID-19 Vaccine (1) Never done  .  TETANUS/TDAP  Never done  . PAP SMEAR-Modifier  Never done    There are no preventive care reminders to display for this patient.  Lab Results  Component Value Date   TSH 2.10 10/06/2017   Lab Results  Component Value Date   WBC 7.4 12/30/2018   HGB 12.8 12/30/2018   HCT 40.5 12/30/2018   MCV 98.3 12/30/2018   PLT 376 12/30/2018   Lab Results  Component Value Date   NA 139 08/17/2019   K 4.0 08/17/2019   CO2 28 08/17/2019   GLUCOSE 119 (H) 08/17/2019   BUN 12 08/17/2019   CREATININE 0.92 08/17/2019   BILITOT 0.4 06/24/2019   ALKPHOS 91 06/24/2019   AST 19 06/24/2019   ALT 24 06/24/2019   PROT 7.5 06/24/2019   ALBUMIN 4.0 06/24/2019   CALCIUM 9.5 08/17/2019   ANIONGAP 9 06/24/2019   GFR 80.01 08/17/2019   Lab Results  Component Value Date   CHOL 227 (H) 08/12/2016   Lab Results  Component Value Date   HDL 46 08/12/2016   Lab Results  Component Value Date   LDLCALC 163 (H) 08/12/2016   Lab Results  Component Value Date   TRIG 92 08/12/2016   Lab Results  Component Value Date   CHOLHDL 4.9 (H) 08/12/2016   Lab Results  Component Value Date   HGBA1C 5.5 08/17/2019      Assessment & Plan:   Problem List Items Addressed This Visit      Cardiovascular and Mediastinum   Essential hypertension - Primary (Chronic)   Relevant Medications   telmisartan (MICARDIS) 40 MG tablet     Respiratory   Reactive airway disease   Relevant Medications   albuterol (VENTOLIN HFA)  108 (90 Base) MCG/ACT inhaler     Other   Depression with anxiety   Relevant Medications   FLUoxetine (PROZAC) 20 MG capsule   Left low back pain      Meds ordered this encounter  Medications  . telmisartan (MICARDIS) 40 MG tablet    Sig: TAKE 1 TABLET(40 MG) BY MOUTH DAILY    Dispense:  90 tablet    Refill:  1  . FLUoxetine (PROZAC) 20 MG capsule    Sig: Take 1qd (stop 10mg )    Dispense:  90 capsule    Refill:  1    D/C 10mg /Start 20mg   . albuterol (VENTOLIN HFA) 108 (90 Base) MCG/ACT inhaler    Sig: Inhale 1-2 puffs into the lungs every 6 (six) hours as needed for wheezing or shortness of breath.    Dispense:  18 g    Refill:  0    Follow-up: Return in about 6 months (around 03/21/2020), or if symptoms worsen or fail to improve.    Libby Maw, MD

## 2019-09-19 NOTE — Patient Instructions (Signed)
What You Need to Know About Chronic Back Pain Long-term (chronic) back pain is back pain that lasts for 12 weeks or longer. It often affects the lower back and can range from mild to severe. Many people have back pain at some point in their lives. It can feel different to each person. It may feel like a muscle ache or a sharp, stabbing pain. The pain often gets worse over time. It can be difficult to find the cause of chronic back pain. Treating chronic back pain often starts with rest and pain relief, followed by exercises (physical therapy) to strengthen the muscles that support your back. You may have to try different things to see what works best for you. If other treatments do not help, or if your pain is caused by a condition or an injury, you may need surgery. How can back pain affect me? Chronic back pain is uncomfortable and can make it hard to do your usual daily activities. Chronic back pain can:  Cause numbness and tingling.  Come and go.  Get worse when you are sitting, standing, walking, bending, or lifting.  Affect you while you are active, at rest, or both.  Eventually make it hard to move around.  Occur with fever, weight loss, or difficulty urinating. What are the benefits of treating back pain? Treating chronic back pain may:  Relieve pain.  Keep your pain from getting worse.  Make it easier for you to do your usual activities. What are some steps I can take to decrease my back pain?   Take over-the-counter or prescription medicines only as told by your health care provider.  If directed, apply heat to the affected area. Use the heat source that your health care provider recommends, such as a moist heat pack or a heating pad. ? Place a towel between your skin and the heat source. ? Leave the heat on for 20-30 minutes. ? Remove the heat if your skin turns bright red. This is especially important if you are unable to feel pain, heat, or cold. You may have a greater  risk of getting burned.  If directed, put ice on the affected area: ? Put ice in a plastic bag. ? Place a towel between your skin and the bag. ? Leave the ice on for 20 minutes, 2-3 times a [place].  Get regular exercise as told by your health care provider to improve flexibility and strength.  Do not smoke.  Maintain a healthy weight.  When lifting objects: ? Keep your feet as far apart as your shoulders (shoulder-width apart) or farther apart. ? Tighten the muscles in your abdomen. ? Bend your knees and hips and keep your spine neutral. It is important to lift using the strength of your legs, not your back. Do not lock your knees straight out. ? Always ask for help to lift heavy or awkward objects. What can happen if my back pain goes untreated? Untreated back pain can:  Get worse over time.  Start to occur more often or at different times, such as when you are resting.  Cause posture problems.  Make it hard to move around (limit mobility). Where can I get support? Chronic back pain can be a frustrating condition to manage. It may help to talk with other people who are having a similar experience. Consider joining a support group for people dealing with chronic back pain. Ask your health care provider about support groups in your area. You can also find online and  in-person support groups through:  The American Chronic Pain Association: DeluxeOption.si  The U.S. Pain Foundation: uspainfoundation.org/support-groups Contact a health care provider if:  Your symptoms do not get better or they get worse.  You have severe back pain.  You have chronic back pain and a fever.  You lose weight without trying.  You have difficulty urinating.  You experience numbness or tingling.  You develop new pain after an injury. Summary  Chronic back pain is often treated with rest, pain relief, and physical therapy.  Get regular exercise to improve your strength and  flexibility.  Put heat and ice on the affected areas as directed by your health care provider.  Chronic back pain can be challenging to live with. Joining a support group may help you manage your condition. This information is not intended to replace advice given to you by your health care provider. Make sure you discuss any questions you have with your health care provider. Document Revised: 04/10/2017 Document Reviewed: 01/05/2016 Elsevier Patient Education  [place].  Mindfulness-Based Stress Reduction Mindfulness-based stress reduction (MBSR) is a program that helps people learn to practice mindfulness. Mindfulness is the practice of intentionally paying attention to the present moment. It can be learned and practiced through techniques such as education, breathing exercises, meditation, and yoga. MBSR includes several mindfulness techniques in one program. MBSR works best when you understand the treatment, are willing to try new things, and can commit to spending time practicing what you learn. MBSR training may include learning about:  How your emotions, thoughts, and reactions affect your body.  New ways to respond to things that cause negative thoughts to start (triggers).  How to notice your thoughts and let go of them.  Practicing awareness of everyday things that you normally do without thinking.  The techniques and goals of different types of meditation. What are the benefits of MBSR? MBSR can have many benefits, which include helping you to:  Develop self-awareness. This refers to knowing and understanding yourself.  Learn skills and attitudes that help you to participate in your own health care.  Learn new ways to care for yourself.  Be more accepting about how things are, and let things go.  Be less judgmental and approach things with an open mind.  Be patient with yourself and trust yourself more. MBSR has also been shown to:  Reduce negative emotions,  such as depression and anxiety.  Improve memory and focus.  Change how you sense and approach pain.  Boost your body's ability to fight infections.  Help you connect better with other people.  Improve your sense of well-being. Follow these instructions at home:   Find a local in-person or online MBSR program.  Set aside some time regularly for mindfulness practice.  Find a mindfulness practice that works best for you. This may include one or more of the following: ? Meditation. Meditation involves focusing your mind on a certain thought or activity. ? Breathing awareness exercises. These help you to stay present by focusing on your breath. ? Body scan. For this practice, you lie down and pay attention to each part of your body from head to toe. You can identify tension and soreness and intentionally relax parts of your body. ? Yoga. Yoga involves stretching and breathing, and it can improve your ability to move and be flexible. It can also provide an experience of testing your body's limits, which can help you release stress. ? Mindful eating. This way of eating involves  focusing on the taste, texture, color, and smell of each bite of food. Because this slows down eating and helps you feel full sooner, it can be an important part of a weight-loss plan.  Find a podcast or recording that provides guidance for breathing awareness, body scan, or meditation exercises. You can listen to these any time when you have a free moment to rest without distractions.  Follow your treatment plan as told by your health care provider. This may include taking regular medicines and making changes to your diet or lifestyle as recommended. How to practice mindfulness To do a basic awareness exercise:  Find a comfortable place to sit.  Pay attention to the present moment. Observe your thoughts, feelings, and surroundings just as they are.  Avoid placing judgment on yourself, your feelings, or your  surroundings. Make note of any judgment that comes up, and let it go.  Your mind may wander, and that is okay. Make note of when your thoughts drift, and return your attention to the present moment. To do basic mindfulness meditation:  Find a comfortable place to sit. This may include a stable chair or a firm floor cushion. ? Sit upright with your back straight. Let your arms fall next to your side with your hands resting on your legs. ? If sitting in a chair, rest your feet flat on the floor. ? If sitting on a cushion, cross your legs in front of you.  Keep your head in a neutral position with your chin dropped slightly. Relax your jaw and rest the tip of your tongue on the roof of your mouth. Drop your gaze to the floor. You can close your eyes if you like.  Breathe normally and pay attention to your breath. Feel the air moving in and out of your nose. Feel your belly expanding and relaxing with each breath.  Your mind may wander, and that is okay. Make note of when your thoughts drift, and return your attention to your breath.  Avoid placing judgment on yourself, your feelings, or your surroundings. Make note of any judgment or feelings that come up, let them go, and bring your attention back to your breath.  When you are ready, lift your gaze or open your eyes. Pay attention to how your body feels after the meditation. Where to find more information You can find more information about MBSR from:  Your health care provider.  Community-based meditation centers or programs.  Programs offered near you. Summary  Mindfulness-based stress reduction (MBSR) is a program that teaches you how to intentionally pay attention to the present moment. It is used with other treatments to help you cope better with daily stress, emotions, and pain.  MBSR focuses on developing self-awareness, which allows you to respond to life stress without judgment or negative emotions.  MBSR programs may involve  learning different mindfulness practices, such as breathing exercises, meditation, yoga, body scan, or mindful eating. Find a mindfulness practice that works best for you, and set aside time for it on a regular basis. This information is not intended to replace advice given to you by your health care provider. Make sure you discuss any questions you have with your health care provider. Document Revised: 04/10/2017 Document Reviewed: 09/04/2016 Elsevier Patient Education  [place].

## 2019-09-22 ENCOUNTER — Ambulatory Visit (INDEPENDENT_AMBULATORY_CARE_PROVIDER_SITE_OTHER): Payer: BC Managed Care – PPO | Admitting: Family Medicine

## 2019-09-22 ENCOUNTER — Other Ambulatory Visit: Payer: Self-pay

## 2019-09-22 ENCOUNTER — Ambulatory Visit (INDEPENDENT_AMBULATORY_CARE_PROVIDER_SITE_OTHER): Payer: BC Managed Care – PPO

## 2019-09-22 ENCOUNTER — Encounter: Payer: Self-pay | Admitting: Family Medicine

## 2019-09-22 VITALS — BP 124/86 | HR 86 | Ht 62.0 in | Wt 210.2 lb

## 2019-09-22 DIAGNOSIS — G8929 Other chronic pain: Secondary | ICD-10-CM | POA: Diagnosis not present

## 2019-09-22 DIAGNOSIS — M545 Low back pain, unspecified: Secondary | ICD-10-CM

## 2019-09-22 MED ORDER — METHOCARBAMOL 500 MG PO TABS: 500.0000 mg | ORAL_TABLET | Freq: Three times a day (TID) | ORAL | 1 refills | Status: DC | PRN

## 2019-09-22 NOTE — Progress Notes (Signed)
Subjective:    I'm seeing this patient as a consultation for:  Dr. Ethelene Hal. Note will be routed back to referring provider/PCP.  CC: Low back pain  I, Molly Weber, LAT, ATC, am serving as scribe for Dr. Lynne Leader.  HPI: Pt is a 44 y/o female presenting w/ c/o L-sided low back pain and L post thigh pain x approximately one year.  She has seen her PCP and has been prescribed Robaxin.  She rates her pain as severe and describes her pain as sharp and aching and she notes a feeling of pressure and some intermittent burning.  Radiating pain: yes into her L post thigh to the knee LE numbness/tingling: Some intermittently but not currently LE weakness: yes in her L quad when going up stairs;  Aggravating factors: transitioning from supine or sit to stand; climbing stairs Treatments tried: Robaxin; aspirin; Tylenol; topical pain relievers  Diagnostic testing: L-spine XR- 07/14/16  Past medical history, Surgical history, Family history, Social history, Allergies, and medications have been entered into the medical record, reviewed.   Review of Systems: No new headache, visual changes, nausea, vomiting, diarrhea, constipation, dizziness, abdominal pain, skin rash, fevers, chills, night sweats, weight loss, swollen lymph nodes, body aches, joint swelling, muscle aches, chest pain, shortness of breath, mood changes, visual or auditory hallucinations.   Objective:    Vitals:   09/22/19 1042  BP: 124/86  Pulse: 86  SpO2: 100%   General: Well Developed, well nourished, and in no acute distress.  Neuro/Psych: Alert and oriented x3, extra-ocular muscles intact, able to move all 4 extremities, sensation grossly intact. Skin: Warm and dry, no rashes noted.  Respiratory: Not using accessory muscles, speaking in full sentences, trachea midline.  Cardiovascular: Pulses palpable, no extremity edema. Abdomen: Does not appear distended. MSK: L-spine: Normal-appearing nontender midline.. Tender palpation  left SI joint. Lumbar motion decreased flexion extension and rotation and lateral flexion. Lower extremity strength is intact except noted below. Reflexes and sensation are equal normal bilaterally. Mildly positive left-sided straight leg raise test.  Left hip normal-appearing normal motion. Mildly tender palpation greater trochanter.  Nontender overlying piriformis and sciatic nerve. Hip adduction strength slightly diminished 4/5. External rotation strength intact without reproduction of symptoms.  Minimally antalgic gait present.    Lab and Radiology Results  X-ray images L-spine obtained today personally and independently reviewed No severe degenerative changes.  No malalignment or acute fractures. Await formal radiology review  X-ray images today compared to images of the lumbar spine obtained during CT scan abdomen and pelvis February 2000 19 part of a CT renal study.   Impression and Recommendations:    Assessment and Plan: 44 y.o. female with chronic low back pain ongoing for about a year worsening recently.  Patient does have some's pain extending to the posterior left thigh but not past the left knee.  Symptoms most likely due to myofascial dysfunction and spasm.  She may have a component of radiculopathy but her symptoms are not classic at all.  Discussion of plan with patient.  Plan for trial of physical therapy and x-ray today.  Radiology review is still pending of x-rays today. Additionally she had some success with methocarbamol will go ahead and refill that. If not improving in about 6 weeks could proceed to MRI.  Technically at this point she does meet criteria for MRI however I think it is reasonable to also try somewhat limited physical therapy trial first.  She agrees with this plan.   Orders Placed  This Encounter  Procedures  . DG Lumbar Spine 2-3 Views    Standing Status:   Future    Number of Occurrences:   1    Standing Expiration Date:   11/21/2020     Order Specific Question:   Reason for Exam (SYMPTOM  OR DIAGNOSIS REQUIRED)    Answer:   eval chronic low back pain    Order Specific Question:   Is patient pregnant?    Answer:   No    Order Specific Question:   Preferred imaging location?    Answer:   [facility]    Order Specific Question:   Radiology Contrast Protocol - do NOT remove file path    Answer:   [email or web address]  . Ambulatory referral to Physical Therapy    Referral Priority:   Routine    Referral Type:   Physical Medicine    Referral Reason:   Specialty Services Required    Requested Specialty:   Physical Therapy   Meds ordered this encounter  Medications  . methocarbamol (ROBAXIN) 500 MG tablet    Sig: Take 1 tablet (500 mg total) by mouth every 8 (eight) hours as needed for muscle spasms.    Dispense:  90 tablet    Refill:  1    Discussed warning signs or symptoms. Please see discharge instructions. Patient expresses understanding.   The above documentation has been reviewed and is accurate and complete Lynne Leader

## 2019-09-22 NOTE — Patient Instructions (Addendum)
Thank you for coming in today.  Plan for PT.  Use heat and TENS unit.  If not better next step is MRI lumbar spine.  Recheck or update me in about 6 weeks.  Ok to contact me sooner if not doing well.   Get xray today on your way out.   TENS UNIT: This is helpful for muscle pain and spasm.   Search and Purchase a TENS 7000 2nd edition at  www.tenspros.com or www.[employer].com It should be less than $30.     TENS unit instructions: Do not shower or bathe with the unit on Turn the unit off before removing electrodes or batteries If the electrodes lose stickiness add a drop of water to the electrodes after they are disconnected from the unit and place on plastic sheet. If you continued to have difficulty, call the TENS unit company to purchase more electrodes. Do not apply lotion on the skin area prior to use. Make sure the skin is clean and dry as this will help prolong the life of the electrodes. After use, always check skin for unusual red areas, rash or other skin difficulties. If there are any skin problems, does not apply electrodes to the same area. Never remove the electrodes from the unit by pulling the wires. Do not use the TENS unit or electrodes other than as directed. Do not change electrode placement without consultating your therapist or physician. Keep 2 fingers with between each electrode. Wear time ratio is 2:1, on to off times.    For example on for 30 minutes off for 15 minutes and then on for 30 minutes off for 15 minutes     Lumbosacral Strain Lumbosacral strain is an injury that causes pain in the lower back (lumbosacral spine). This injury usually happens from overstretching the muscles or ligaments along your spine. Ligaments are cord-like tissues that connect bones to other bones. A strain can affect one or more muscles or ligaments. What are the causes? This condition may be caused by: A hard, direct hit to the back. Overstretching the lower back muscles.  This may result from: A fall. Lifting something heavy. Repetitive movements such as bending or crouching. What increases the risk? The following factors may make you more likely to develop this condition: Participating in sports or activities that involve: A sudden twist of the back. Pushing or pulling motions. Being overweight or obese. Having poor strength and flexibility, especially tight hamstrings or weak muscles in the back or abdomen. Having too much of a curve in the lower back. Having a pelvis that is tilted forward. What are the signs or symptoms? The main symptom of this condition is pain in the lower back, at the site of the strain. Pain may also be felt down one or both legs. How is this diagnosed? This condition is diagnosed based on your symptoms, your medical history, and a physical exam. During the physical exam, your health care provider may push on certain areas of your back to find the source of your pain. You may be asked to bend forward, backward, and side to side to check your pain and range of motion. You may also have imaging tests, such as X-rays and an MRI. How is this treated? This condition may be treated by: Applying heat and cold on the affected area. Taking medicines to help relieve pain and relax your muscles. Taking NSAIDs, such as ibuprofen, to help reduce swelling and discomfort. Doing stretching and strengthening exercises for your lower back.  Symptoms usually improve within several weeks of treatment. However, recovery time varies. When your symptoms improve, gradually return to your normal routine as soon as possible to reduce pain, avoid stiffness, and keep muscle strength. Follow these instructions at home: Medicines Take over-the-counter and prescription medicines only as told by your health care provider. Ask your health care provider if the medicine prescribed to you: Requires you to avoid driving or using heavy machinery. Can cause  constipation. You may need to take these actions to prevent or treat constipation: Drink enough fluid to keep your urine pale yellow. Take over-the-counter or prescription medicines. Eat foods that are high in fiber, such as beans, whole grains, and fresh fruits and vegetables. Limit foods that are high in fat and processed sugars, such as fried or sweet foods. Managing pain, stiffness, and swelling     If directed, put ice on the injured area. To do this: Put ice in a plastic bag. Place a towel between your skin and the bag. Leave the ice on for 20 minutes, 2-3 times a day. If directed, apply heat on the affected area as often as told by your health care provider. Use the heat source that your health care provider recommends, such as a moist heat pack or a heating pad. Place a towel between your skin and the heat source. Leave the heat on for 20-30 minutes. Remove the heat if your skin turns bright red. This is especially important if you are unable to feel pain, heat, or cold. You may have a greater risk of getting burned. Activity Rest as told by your health care provider. Do not stay in bed. Staying in bed for more than 1-2 days can delay your recovery. Return to your normal activities as told by your health care provider. Ask your health care provider what activities are safe for you. Avoid activities that take a lot of energy for as long as told by your health care provider. Do exercises as told by your health care provider. This includes stretching and strengthening exercises. General instructions Sit up and stand up straight. Avoid leaning forward when you sit, or hunching over when you stand. Do not use any products that contain nicotine or tobacco, such as cigarettes, e-cigarettes, and chewing tobacco. If you need help quitting, ask your health care provider. Keep all follow-up visits as told by your health care provider. This is important. How is this prevented?  Use correct  form when playing sports and lifting heavy objects. Use good posture when sitting and standing. Maintain a healthy weight. Sleep on a mattress with medium firmness to support your back. Do at least 150 minutes of moderate-intensity exercise each week, such as brisk walking or water aerobics. Try a form of exercise that takes stress off your back, such as swimming or stationary cycling. Maintain physical fitness, including: Strength. Flexibility. Contact a health care provider if: Your back pain does not improve after several weeks of treatment. Your symptoms get worse. Get help right away if: Your back pain is severe. You cannot stand or walk. You have difficulty controlling when you urinate or when you have a bowel movement. You feel nauseous or you vomit. Your feet or legs get very cold, turn pale, or look blue. You have numbness, tingling, weakness, or problems using your arms or legs. You develop any of the following: Shortness of breath. Dizziness. Pain in your legs. Weakness in your buttocks or legs. Summary Lumbosacral strain is an injury that causes pain in  the lower back (lumbosacral spine). This injury usually happens from overstretching the muscles or ligaments along your spine. This condition may be caused by a direct hit to the lower back or by overstretching the lower back muscles. Symptoms usually improve within several weeks of treatment. This information is not intended to replace advice given to you by your health care provider. Make sure you discuss any questions you have with your health care provider. Document Revised: 09/21/2018 Document Reviewed: 09/21/2018 Elsevier Patient Education  [place].

## 2019-09-23 NOTE — Progress Notes (Signed)
X-ray lumbar spine shows arthritis in the low part of the low back not much change from prior study.

## 2019-10-07 ENCOUNTER — Other Ambulatory Visit: Payer: Self-pay | Admitting: Internal Medicine

## 2019-10-11 ENCOUNTER — Other Ambulatory Visit: Payer: Self-pay | Admitting: Family Medicine

## 2019-10-11 DIAGNOSIS — J4520 Mild intermittent asthma, uncomplicated: Secondary | ICD-10-CM

## 2019-10-12 ENCOUNTER — Other Ambulatory Visit: Payer: Self-pay

## 2019-10-12 ENCOUNTER — Ambulatory Visit: Payer: BC Managed Care – PPO | Attending: Family Medicine | Admitting: Physical Therapy

## 2019-10-12 ENCOUNTER — Other Ambulatory Visit: Payer: Self-pay | Admitting: Family Medicine

## 2019-10-12 DIAGNOSIS — G8929 Other chronic pain: Secondary | ICD-10-CM | POA: Diagnosis not present

## 2019-10-12 DIAGNOSIS — M6281 Muscle weakness (generalized): Secondary | ICD-10-CM | POA: Diagnosis not present

## 2019-10-12 DIAGNOSIS — M545 Low back pain, unspecified: Secondary | ICD-10-CM

## 2019-10-12 DIAGNOSIS — F418 Other specified anxiety disorders: Secondary | ICD-10-CM
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
## 2019-10-12 NOTE — Patient Instructions (Signed)
Access Code: [email or web address] URL: [email or web address] Date: 10/12/2019 Prepared by: Hilda Blades  Exercises Supine Piriformis Stretch with Foot on Ground - 2 x daily - 7 x weekly - 3 reps - 20 seconds hold Modified Thomas Stretch - 2 x daily - 7 x weekly - 3 reps - 20 seconds hold Bridge - 2 x daily - 7 x weekly - 2 sets - 8 reps Sidelying Hip Abduction - 2 x daily - 7 x weekly - 2 sets - 8 reps

## 2019-10-13 ENCOUNTER — Ambulatory Visit: Payer: BC Managed Care – PPO | Admitting: Physical Therapy

## 2019-10-24 ENCOUNTER — Encounter: Payer: Self-pay | Admitting: Physical Therapy

## 2019-10-27 ENCOUNTER — Ambulatory Visit: Payer: BC Managed Care – PPO | Admitting: Physical Therapy

## 2019-11-04 ENCOUNTER — Other Ambulatory Visit: Payer: Self-pay | Admitting: Family Medicine

## 2019-11-04 ENCOUNTER — Ambulatory Visit: Payer: BC Managed Care – PPO | Admitting: Physical Therapy

## 2019-11-04 DIAGNOSIS — J4520 Mild intermittent asthma, uncomplicated: Secondary | ICD-10-CM

## 2019-11-10 ENCOUNTER — Encounter: Payer: BC Managed Care – PPO | Admitting: Physical Therapy

## 2019-12-21 ENCOUNTER — Encounter: Payer: Self-pay | Admitting: Family Medicine

## 2019-12-21 DIAGNOSIS — M545 Low back pain, unspecified: Secondary | ICD-10-CM

## 2019-12-22 MED ORDER — METHOCARBAMOL 500 MG PO TABS: 500.0000 mg | ORAL_TABLET | Freq: Three times a day (TID) | ORAL | 1 refills | Status: DC | PRN

## 2020-03-12 ENCOUNTER — Encounter: Payer: Self-pay | Admitting: Family Medicine

## 2020-03-12 ENCOUNTER — Telehealth (INDEPENDENT_AMBULATORY_CARE_PROVIDER_SITE_OTHER): Payer: BC Managed Care – PPO | Admitting: Family Medicine

## 2020-03-12 VITALS — Temp 98.6°F | Ht 62.0 in | Wt 210.0 lb

## 2020-03-12 DIAGNOSIS — F418 Other specified anxiety disorders: Secondary | ICD-10-CM

## 2020-03-12 DIAGNOSIS — E6609 Other obesity due to excess calories: Secondary | ICD-10-CM | POA: Diagnosis not present

## 2020-03-12 DIAGNOSIS — I10 Essential (primary) hypertension: Secondary | ICD-10-CM

## 2020-03-12 DIAGNOSIS — M545 Low back pain, unspecified: Secondary | ICD-10-CM

## 2020-03-12 DIAGNOSIS — E1169 Type 2 diabetes mellitus with other specified complication: Secondary | ICD-10-CM | POA: Insufficient documentation

## 2020-03-12 DIAGNOSIS — E7800 Pure hypercholesterolemia, unspecified: Secondary | ICD-10-CM

## 2020-03-12 DIAGNOSIS — Z6838 Body mass index (BMI) 38.0-38.9, adult: Secondary | ICD-10-CM

## 2020-03-12 MED ORDER — FLUOXETINE HCL 20 MG PO CAPS: ORAL_CAPSULE | ORAL | 1 refills | Status: DC

## 2020-03-12 MED ORDER — TELMISARTAN 40 MG PO TABS: ORAL_TABLET | ORAL | 1 refills | Status: DC

## 2020-03-12 MED ORDER — METHOCARBAMOL 500 MG PO TABS: 500.0000 mg | ORAL_TABLET | Freq: Three times a day (TID) | ORAL | 1 refills | Status: DC | PRN

## 2020-03-12 NOTE — Progress Notes (Signed)
Established Patient Office Visit  Subjective:  Patient ID: Angila Wombles, female    DOB: November 06, 1975  Age: 44 y.o. MRN: [id number]  CC:  Chief Complaint  Patient presents with  . Follow-up    6 month follow up on back pains, patient states she still has back pains she's working on losing weight but have not seem to lose any pounds.     HPI Kimba Crowe-Morrison presents for follow up of htn and anxiety with depression. Bp when checked runs in the 120s/80s. Doing well with prozac. Back bothers her when she tries to exercise. It seems to lock up.Unable to continue with pt secondary to job stress.  She knows that weight loss would be important and agrees to look into Weight loss management.   Past Medical History:  Diagnosis Date  . Dyspnea on exertion   . History of acute pyelonephritis 06/26/2017   w/ sepsis  . History of kidney stones    per pt 2008 and passed spontenaously  . History of pseudoseizure    07-07-2017 per had first episode seizure activity that told to her caused by extreme stress in 2012, 2015 and last one 2017;  per pt was told no medication needed  . Hypercholesterolemia   . Hyperprolactinemia [facility]) dx 2012   endocrinologist-  dr Cruzita Lederer  . Hypertension   . Migraines   . Pituitary microadenoma ([place])    dx per MRI 06/ 2018  . Wears glasses     Past Surgical History:  Procedure Laterality Date  . CESAREAN SECTION  x2  last one 2000   Bilateral Tubal Ligation w/ last c/s  . CYSTOSCOPY/RETROGRADE/URETEROSCOPY Right 06/26/2017   Procedure: CYSTOSCOPY RIGHT RETROGRADE RIGHT CYSTOSCOPY;  Surgeon: Festus Aloe, MD;  Location: [facility];  Service: Urology;  Laterality: Right;  . CYSTOSCOPY/URETEROSCOPY/HOLMIUM LASER/STENT PLACEMENT Right 07/10/2017   Procedure: CYSTOSCOPY/URETEROSCOPY/HOLMIUM LASER/STENT EXCHANGE;  Surgeon: Festus Aloe, MD;  Location: [facility];  Service: Urology;  Laterality: Right;    Family History  Problem  Relation Age of Onset  . Hypertension Other   . Stroke Other   . Heart failure Other   . Heart attack Mother   . Stroke Father     Social History   Socioeconomic History  . Marital status: Married    Spouse name: Not on file  . Number of children: Not on file  . Years of education: Not on file  . Highest education level: Not on file  Occupational History  . Not on file  Tobacco Use  . Smoking status: Current Every Day Smoker    Packs/day: 0.50    Years: 6.00    Pack years: 3.00    Types: Cigarettes  . Smokeless tobacco: Never Used  Vaping Use  . Vaping Use: Never used  Substance and Sexual Activity  . Alcohol use: Yes    Comment: occa  . Drug use: No  . Sexual activity: Yes  Other Topics Concern  . Not on file  Social History Narrative  . Not on file   Social Determinants of Health   Financial Resource Strain:   . Difficulty of Paying Living Expenses: Not on file  Food Insecurity:   . Worried About Charity fundraiser in the Last Year: Not on file  . Ran Out of Food in the Last Year: Not on file  Transportation Needs:   . Lack of Transportation (Medical): Not on file  . Lack of Transportation (Non-Medical): Not on file  Physical Activity:   .  Days of Exercise per Week: Not on file  . Minutes of Exercise per Session: Not on file  Stress:   . Feeling of Stress : Not on file  Social Connections:   . Frequency of Communication with Friends and Family: Not on file  . Frequency of Social Gatherings with Friends and Family: Not on file  . Attends Religious Services: Not on file  . Active Member of Clubs or Organizations: Not on file  . Attends Archivist Meetings: Not on file  . Marital Status: Not on file  Intimate Partner Violence:   . Fear of Current or Ex-Partner: Not on file  . Emotionally Abused: Not on file  . Physically Abused: Not on file  . Sexually Abused: Not on file    Outpatient Medications Prior to Visit  Medication Sig Dispense  Refill  . albuterol (VENTOLIN HFA) 108 (90 Base) MCG/ACT inhaler INHALE 1 TO 2 PUFFS INTO THE LUNGS EVERY 6 HOURS AS NEEDED FOR WHEEZING OR SHORTNESS OF BREATH 8.5 g 0  . aspirin-acetaminophen-caffeine (EXCEDRIN MIGRAINE) 250-250-65 MG tablet Take by mouth every 6 (six) hours as needed for headache.    [name] FLUoxetine (PROZAC) 20 MG capsule Take 1qd (stop 10mg ) 90 capsule 1  . methocarbamol (ROBAXIN) 500 MG tablet Take 1 tablet (500 mg total) by mouth every 8 (eight) hours as needed for muscle spasms. 90 tablet 1  . telmisartan (MICARDIS) 40 MG tablet TAKE 1 TABLET(40 MG) BY MOUTH DAILY 90 tablet 1  . acetaminophen (TYLENOL) 500 MG tablet Take 500 mg by mouth every 6 (six) hours as needed. (Patient not taking: Reported on 03/12/2020)     No facility-administered medications prior to visit.    Allergies  Allergen Reactions  . Imitrex [Sumatriptan] Nausea And Vomiting  . Oxycodone Itching    Percocet "feel extremely itching and skin burns"  . Percocet [Oxycodone-Acetaminophen]     itching    ROS Review of Systems  Constitutional: Negative.   HENT: Negative.   Respiratory: Negative.   Cardiovascular: Negative.   Gastrointestinal: Negative.   Genitourinary: Negative.   Musculoskeletal: Positive for back pain and myalgias.  Allergic/Immunologic: Negative for immunocompromised state.  Neurological: Negative for weakness.  Hematological: Positive for adenopathy. Does not bruise/bleed easily.  Psychiatric/Behavioral: Positive for dysphoric mood.      Objective:    Physical Exam Vitals and nursing note reviewed.  Constitutional:      General: She is not in acute distress.    Appearance: Normal appearance. She is obese. She is not ill-appearing, toxic-appearing or diaphoretic.  HENT:     Head: Normocephalic and atraumatic.     Left Ear: External ear normal.  Eyes:     General: No scleral icterus.       Right eye: No discharge.        Left eye: No discharge.     Conjunctiva/sclera:  Conjunctivae normal.  Pulmonary:     Effort: Pulmonary effort is normal.  Neurological:     Mental Status: She is oriented to person, place, and time.  Psychiatric:        Mood and Affect: Mood normal.        Behavior: Behavior normal.     Temp 98.6 F (37 C) (Tympanic)   Ht 5\' 2"  (1.575 m)   Wt 210 lb (95.3 kg)   BMI 38.41 kg/m  Wt Readings from Last 3 Encounters:  03/12/20 210 lb (95.3 kg)  09/22/19 210 lb 3.2 oz (95.3 kg)  09/19/19 210  lb 14.4 oz (95.7 kg)     Health Maintenance Due  Topic Date Due  . Hepatitis C Screening  Never done  . COVID-19 Vaccine (1) Never done  . TETANUS/TDAP  Never done  . PAP SMEAR-Modifier  Never done  . INFLUENZA VACCINE  12/11/2019    There are no preventive care reminders to display for this patient.  Lab Results  Component Value Date   TSH 2.10 10/06/2017   Lab Results  Component Value Date   WBC 7.4 12/30/2018   HGB 12.8 12/30/2018   HCT 40.5 12/30/2018   MCV 98.3 12/30/2018   PLT 376 12/30/2018   Lab Results  Component Value Date   NA 139 08/17/2019   K 4.0 08/17/2019   CO2 28 08/17/2019   GLUCOSE 119 (H) 08/17/2019   BUN 12 08/17/2019   CREATININE 0.92 08/17/2019   BILITOT 0.4 06/24/2019   ALKPHOS 91 06/24/2019   AST 19 06/24/2019   ALT 24 06/24/2019   PROT 7.5 06/24/2019   ALBUMIN 4.0 06/24/2019   CALCIUM 9.5 08/17/2019   ANIONGAP 9 06/24/2019   GFR 80.01 08/17/2019   Lab Results  Component Value Date   CHOL 227 (H) 08/12/2016   Lab Results  Component Value Date   HDL 46 08/12/2016   Lab Results  Component Value Date   LDLCALC 163 (H) 08/12/2016   Lab Results  Component Value Date   TRIG 92 08/12/2016   Lab Results  Component Value Date   CHOLHDL 4.9 (H) 08/12/2016   Lab Results  Component Value Date   HGBA1C 5.5 08/17/2019      Assessment & Plan:   Problem List Items Addressed This Visit      Cardiovascular and Mediastinum   Essential hypertension - Primary (Chronic)   Relevant  Medications   telmisartan (MICARDIS) 40 MG tablet   Other Relevant Orders   Basic metabolic panel     Other   Class 2 obesity due to excess calories with body mass index (BMI) of 38.0 to 38.9 in adult (Chronic)   Relevant Orders   Amb Ref to Medical Weight Management   Depression with anxiety   Relevant Medications   FLUoxetine (PROZAC) 20 MG capsule   Left low back pain   Relevant Medications   methocarbamol (ROBAXIN) 500 MG tablet   Elevated LDL cholesterol level   Relevant Orders   LDL cholesterol, direct      Meds ordered this encounter  Medications  . telmisartan (MICARDIS) 40 MG tablet    Sig: TAKE 1 TABLET(40 MG) BY MOUTH DAILY    Dispense:  90 tablet    Refill:  1  . FLUoxetine (PROZAC) 20 MG capsule    Sig: Take 1qd (stop 10mg )    Dispense:  90 capsule    Refill:  1    D/C 10mg /Start 20mg   . methocarbamol (ROBAXIN) 500 MG tablet    Sig: Take 1 tablet (500 mg total) by mouth every 8 (eight) hours as needed for muscle spasms.    Dispense:  90 tablet    Refill:  1    Follow-up: Return in about 6 months (around 09/09/2020), or if symptoms worsen or fail to improve.   Will try weight loss management. Return for BMP and in spring  Libby Maw, MD   Virtual Visit via Video Note  I connected with Rudolpho Sevin on 03/12/20 at  2:00 PM EDT by a video enabled telemedicine application and verified that I am speaking with  the correct person using two identifiers.  Location: Patient: home with family.  Provider: home   I discussed the limitations of evaluation and management by telemedicine and the availability of in person appointments. The patient expressed understanding and agreed to proceed.  History of Present Illness:    Observations/Objective:   Assessment and Plan:   Follow Up Instructions:    I discussed the assessment and treatment plan with the patient. The patient was provided an opportunity to ask questions and all were  answered. The patient agreed with the plan and demonstrated an understanding of the instructions.   The patient was advised to call back or seek an in-person evaluation if the symptoms worsen or if the condition fails to improve as anticipated.  I provided 30 minutes of non-face-to-face time during this encounter.   Libby Maw, MD

## 2020-03-14 ENCOUNTER — Encounter (INDEPENDENT_AMBULATORY_CARE_PROVIDER_SITE_OTHER): Payer: Self-pay

## 2020-03-21 ENCOUNTER — Ambulatory Visit: Payer: BC Managed Care – PPO | Admitting: Family Medicine

## 2020-03-26 ENCOUNTER — Encounter: Payer: Self-pay | Admitting: Family Medicine

## 2020-04-05 ENCOUNTER — Other Ambulatory Visit: Payer: Self-pay | Admitting: Family Medicine

## 2020-04-05 DIAGNOSIS — F418 Other specified anxiety disorders: Secondary | ICD-10-CM

## 2020-06-10 ENCOUNTER — Encounter (INDEPENDENT_AMBULATORY_CARE_PROVIDER_SITE_OTHER): Payer: Self-pay

## 2020-06-11 ENCOUNTER — Encounter: Payer: Self-pay | Admitting: Family Medicine

## 2020-07-25 ENCOUNTER — Other Ambulatory Visit: Payer: Self-pay

## 2020-07-26 ENCOUNTER — Encounter: Payer: Self-pay | Admitting: Family Medicine

## 2020-07-26 ENCOUNTER — Ambulatory Visit (INDEPENDENT_AMBULATORY_CARE_PROVIDER_SITE_OTHER): Payer: BC Managed Care – PPO | Admitting: Family Medicine

## 2020-07-26 VITALS — BP 142/90 | HR 90 | Temp 96.5°F | Ht 62.0 in | Wt 223.8 lb

## 2020-07-26 DIAGNOSIS — Z0000 Encounter for general adult medical examination without abnormal findings: Secondary | ICD-10-CM

## 2020-07-26 DIAGNOSIS — F418 Other specified anxiety disorders: Secondary | ICD-10-CM | POA: Diagnosis not present

## 2020-07-26 DIAGNOSIS — Z6838 Body mass index (BMI) 38.0-38.9, adult: Secondary | ICD-10-CM

## 2020-07-26 DIAGNOSIS — E6609 Other obesity due to excess calories: Secondary | ICD-10-CM

## 2020-07-26 DIAGNOSIS — F439 Reaction to severe stress, unspecified: Secondary | ICD-10-CM

## 2020-07-26 DIAGNOSIS — I10 Essential (primary) hypertension: Secondary | ICD-10-CM

## 2020-07-26 LAB — CBC
HCT: 40.5 % (ref 36.0–46.0)
Hemoglobin: 13.9 g/dL (ref 12.0–15.0)
MCHC: 34.4 g/dL (ref 30.0–36.0)
MCV: 90.3 fl (ref 78.0–100.0)
Platelets: 189 10*3/uL (ref 150.0–400.0)
RBC: 4.48 Mil/uL (ref 3.87–5.11)
RDW: 12.6 % (ref 11.5–15.5)
WBC: 5 10*3/uL (ref 4.0–10.5)

## 2020-07-26 LAB — BASIC METABOLIC PANEL
BUN: 14 mg/dL (ref 6–23)
CO2: 29 mEq/L (ref 19–32)
Calcium: 9.3 mg/dL (ref 8.4–10.5)
Chloride: 103 mEq/L (ref 96–112)
Creatinine, Ser: 0.8 mg/dL (ref 0.40–1.20)
GFR: 88.9 mL/min (ref >60.00)
Glucose, Bld: 89 mg/dL (ref 70–99)
Potassium: 4.2 mEq/L (ref 3.5–5.1)
Sodium: 139 mEq/L (ref 135–145)

## 2020-07-26 LAB — LIPID PANEL
Cholesterol: 148 mg/dL (ref 0–200)
HDL: 57.7 mg/dL (ref >39.00)
LDL Cholesterol: 78 mg/dL (ref 0–99)
NonHDL: 90.69
Total CHOL/HDL Ratio: 3
Triglycerides: 63 mg/dL (ref 0.0–149.0)
VLDL: 12.6 mg/dL (ref 0.0–40.0)

## 2020-07-26 MED ORDER — FLUOXETINE HCL 40 MG PO CAPS: 40.0000 mg | ORAL_CAPSULE | Freq: Every day | ORAL | 3 refills | Status: DC

## 2020-07-26 MED ORDER — HYDROCHLOROTHIAZIDE 25 MG PO TABS: 25.0000 mg | ORAL_TABLET | Freq: Every day | ORAL | 3 refills | Status: DC

## 2020-07-26 NOTE — Progress Notes (Signed)
Established Patient Office Visit  Subjective:  Patient ID: Tracy Johns, female    DOB: [birth year]  Age: 45 y.o. MRN: [id number]  CC:  Chief Complaint  Patient presents with  . Acute Visit    Pt states she is being seen because the last few days her bp has been >=160 on top and >=95 on the bottom with her home cuff, pt states checks bp twice a day. Has headche that feel as if someone it=s trying to pop her eyes out of her head, has high stressed job, and family issues.     HPI Tracy Johns presents for follow-up of a family and work crisis that is been affecting her mental and physical health.  Depression has worsened.  She continues with 20 mg of Prozac and is tolerating the drug well.  Blood pressure has been elevated at home 160/100.  Work has been extremely stressful.  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  She had asked her 22 year old daughter to leave her home along with the patient's 37-year-old granddaughter.  She has gained 12 pounds.  Past Medical History:  Diagnosis Date  . Dyspnea on exertion   . History of acute pyelonephritis 06/26/2017   w/ sepsis  . History of kidney stones    per pt 2008 and passed spontenaously  . History of pseudoseizure    07-07-2017 per had first episode seizure activity that told to her caused by extreme stress in 2012, 2015 and last one 2017;  per pt was told no medication needed  . Hypercholesterolemia   . Hyperprolactinemia [facility]) dx 2012   endocrinologist-  dr Cruzita Lederer  . Hypertension   . Migraines   . Pituitary microadenoma ([place])    dx per MRI 06/ 2018  . Wears glasses     Past Surgical History:  Procedure Laterality Date  . CESAREAN SECTION  x2  last one 2000   Bilateral Tubal Ligation w/ last c/s  . CYSTOSCOPY/RETROGRADE/URETEROSCOPY Right 06/26/2017   Procedure: CYSTOSCOPY RIGHT RETROGRADE RIGHT CYSTOSCOPY;  Surgeon: Festus Aloe, MD;  Location: [facility];  Service: Urology;  Laterality: Right;  .  CYSTOSCOPY/URETEROSCOPY/HOLMIUM LASER/STENT PLACEMENT Right 07/10/2017   Procedure: CYSTOSCOPY/URETEROSCOPY/HOLMIUM LASER/STENT EXCHANGE;  Surgeon: Festus Aloe, MD;  Location: [facility];  Service: Urology;  Laterality: Right;    Family History  Problem Relation Age of Onset  . Hypertension Other   . Stroke Other   . Heart failure Other   . Heart attack Mother   . Stroke Father     Social History   Socioeconomic History  . Marital status: Married    Spouse name: Not on file  . Number of children: Not on file  . Years of education: Not on file  . Highest education level: Not on file  Occupational History  . Not on file  Tobacco Use  . Smoking status: Current Every Day Smoker    Packs/day: 0.50    Years: 6.00    Pack years: 3.00    Types: Cigarettes  . Smokeless tobacco: Never Used  Vaping Use  . Vaping Use: Never used  Substance and Sexual Activity  . Alcohol use: Yes    Comment: occa  . Drug use: No  . Sexual activity: Yes  Other Topics Concern  . Not on file  Social History Narrative  . Not on file   Social Determinants of Health   Financial Resource Strain: Not on file  Food Insecurity: Not on file  Transportation Needs: Not  on file  Physical Activity: Not on file  Stress: Not on file  Social Connections: Not on file  Intimate Partner Violence: Not on file    Outpatient Medications Prior to Visit  Medication Sig Dispense Refill  . acetaminophen (TYLENOL) 500 MG tablet Take 500 mg by mouth every 6 (six) hours as needed.    [name] albuterol (VENTOLIN HFA) 108 (90 Base) MCG/ACT inhaler INHALE 1 TO 2 PUFFS INTO THE LUNGS EVERY 6 HOURS AS NEEDED FOR WHEEZING OR SHORTNESS OF BREATH 8.5 g 0  . aspirin-acetaminophen-caffeine (EXCEDRIN MIGRAINE) 250-250-65 MG tablet Take by mouth every 6 (six) hours as needed for headache.    . methocarbamol (ROBAXIN) 500 MG tablet Take 1 tablet (500 mg total) by mouth every 8 (eight) hours as needed for muscle  spasms. 90 tablet 1  . telmisartan (MICARDIS) 40 MG tablet TAKE 1 TABLET(40 MG) BY MOUTH DAILY 90 tablet 1  . FLUoxetine (PROZAC) 20 MG capsule TAKE 1 CAPSULE BY MOUTH EVERY DAY. STOP 10 MG 90 capsule 1   No facility-administered medications prior to visit.    Allergies  Allergen Reactions  . Imitrex [Sumatriptan] Nausea And Vomiting  . Oxycodone Itching    Percocet "feel extremely itching and skin burns"  . Percocet [Oxycodone-Acetaminophen]     itching    ROS Review of Systems  Constitutional: Negative.   HENT: Negative.   Eyes: Negative for photophobia and visual disturbance.  Respiratory: Negative.   Cardiovascular: Negative.   Gastrointestinal: Negative.   Endocrine: Negative for polyphagia and polyuria.  Genitourinary: Negative.   Neurological: Negative.   Psychiatric/Behavioral: Positive for decreased concentration, dysphoric mood and sleep disturbance. Negative for self-injury and suicidal ideas. The patient is nervous/anxious.       Objective:    Physical Exam Vitals and nursing note reviewed.  Constitutional:      General: She is not in acute distress.    Appearance: Normal appearance. She is obese. She is not ill-appearing, toxic-appearing or diaphoretic.  HENT:     Head: Normocephalic and atraumatic.     Right Ear: Tympanic membrane, ear canal and external ear normal.     Left Ear: Tympanic membrane, ear canal and external ear normal.  Eyes:     Extraocular Movements: Extraocular movements intact.     Conjunctiva/sclera: Conjunctivae normal.     Pupils: Pupils are equal, round, and reactive to light.  Cardiovascular:     Rate and Rhythm: Normal rate and regular rhythm.  Pulmonary:     Effort: Pulmonary effort is normal.     Breath sounds: Normal breath sounds.  Abdominal:     General: Bowel sounds are normal.  Musculoskeletal:     Cervical back: No rigidity or tenderness.     Right lower leg: No edema.     Left lower leg: No edema.   Lymphadenopathy:     Cervical: No cervical adenopathy.  Neurological:     Mental Status: She is alert and oriented to person, place, and time.  Psychiatric:        Mood and Affect: Mood normal.        Behavior: Behavior normal.    Depression screen [facility] 2/9 07/26/2020 03/12/2020 09/19/2019  Decreased Interest 2 0 1  Down, Depressed, Hopeless 3 0 1  PHQ - 2 Score 5 0 2  Altered sleeping 2 - 0  Tired, decreased energy 2 - 2  Change in appetite 2 - 1  Feeling bad or failure about yourself  2 - 1  Trouble concentrating 0 - 2  Moving slowly or fidgety/restless 1 - 0  Suicidal thoughts 0 - 0  PHQ-9 Score 14 - 8  Difficult doing work/chores Very difficult - Somewhat difficult    BP (!) 142/90 (BP Location: Left Arm, Patient Position: Sitting, Cuff Size: Large)   Pulse 90   Temp (!) 96.5 F (35.8 C) (Temporal)   Wt 223 lb 12.8 oz (101.5 kg)   SpO2 98%   BMI 40.93 kg/m  Wt Readings from Last 3 Encounters:  07/26/20 223 lb 12.8 oz (101.5 kg)  03/12/20 210 lb (95.3 kg)  09/22/19 210 lb 3.2 oz (95.3 kg)     Health Maintenance Due  Topic Date Due  . Hepatitis C Screening  Never done  . COVID-19 Vaccine (1) Never done  . TETANUS/TDAP  Never done  . PAP SMEAR-Modifier  Never done  . INFLUENZA VACCINE  12/11/2019  . COLONOSCOPY (Pts 45-90yrs Insurance coverage will need to be confirmed)  Never done    There are no preventive care reminders to display for this patient.  Lab Results  Component Value Date   TSH 2.10 10/06/2017   Lab Results  Component Value Date   WBC 7.4 12/30/2018   HGB 12.8 12/30/2018   HCT 40.5 12/30/2018   MCV 98.3 12/30/2018   PLT 376 12/30/2018   Lab Results  Component Value Date   NA 139 08/17/2019   K 4.0 08/17/2019   CO2 28 08/17/2019   GLUCOSE 119 (H) 08/17/2019   BUN 12 08/17/2019   CREATININE 0.92 08/17/2019   BILITOT 0.4 06/24/2019   ALKPHOS 91 06/24/2019   AST 19 06/24/2019   ALT 24 06/24/2019   PROT 7.5 06/24/2019   ALBUMIN 4.0  06/24/2019   CALCIUM 9.5 08/17/2019   ANIONGAP 9 06/24/2019   GFR 80.01 08/17/2019   Lab Results  Component Value Date   CHOL 227 (H) 08/12/2016   Lab Results  Component Value Date   HDL 46 08/12/2016   Lab Results  Component Value Date   LDLCALC 163 (H) 08/12/2016   Lab Results  Component Value Date   TRIG 92 08/12/2016   Lab Results  Component Value Date   CHOLHDL 4.9 (H) 08/12/2016   Lab Results  Component Value Date   HGBA1C 5.5 08/17/2019      Assessment & Plan:   Problem List Items Addressed This Visit      Cardiovascular and Mediastinum   Essential hypertension - Primary (Chronic)   Relevant Medications   hydrochlorothiazide (HYDRODIURIL) 25 MG tablet   Other Relevant Orders   Basic metabolic panel   CBC     Other   Class 2 obesity due to excess calories with body mass index (BMI) of 38.0 to 38.9 in adult (Chronic)   Depression with anxiety   Relevant Medications   FLUoxetine (PROZAC) 40 MG capsule    Other Visit Diagnoses    Stress at home       Healthcare maintenance       Relevant Orders   Lipid panel      Meds ordered this encounter  Medications  . FLUoxetine (PROZAC) 40 MG capsule    Sig: Take 1 capsule (40 mg total) by mouth daily.    Dispense:  90 capsule    Refill:  3  . hydrochlorothiazide (HYDRODIURIL) 25 MG tablet    Sig: Take 1 tablet (25 mg total) by mouth daily.    Dispense:  90 tablet    Refill:  3    Follow-up: Return in about 6 weeks (around 09/06/2020).  Have increased Prozac to 40 mg.  Have added HCTZ 25 mg daily. Out of work through the end of the month.  Again suggested that weight loss would help her sense of wellbeing and blood pressure. Libby Maw, MD

## 2020-07-30 ENCOUNTER — Encounter: Payer: Self-pay | Admitting: Family Medicine

## 2020-07-31 NOTE — Telephone Encounter (Signed)
Please see message and advise.  Thank you. ° °

## 2020-08-01 ENCOUNTER — Encounter: Payer: Self-pay | Admitting: Family Medicine

## 2020-08-03 ENCOUNTER — Telehealth: Payer: Self-pay | Admitting: Family Medicine

## 2020-08-03 NOTE — Telephone Encounter (Signed)
I called pt and let her know her paperwork was ready for pickup.

## 2020-08-05 ENCOUNTER — Ambulatory Visit (HOSPITAL_COMMUNITY)
Admission: EM | Admit: 2020-08-05 | Discharge: 2020-08-05 | Disposition: A | Discharge status: Home / Self Care | Payer: BC Managed Care – PPO | Attending: Emergency Medicine | Admitting: Emergency Medicine

## 2020-08-05 ENCOUNTER — Encounter (HOSPITAL_COMMUNITY): Payer: Self-pay | Admitting: *Deleted

## 2020-08-05 ENCOUNTER — Other Ambulatory Visit: Payer: Self-pay

## 2020-08-05 DIAGNOSIS — M5442 Lumbago with sciatica, left side: Secondary | ICD-10-CM

## 2020-08-05 DIAGNOSIS — G8929 Other chronic pain: Secondary | ICD-10-CM

## 2020-08-05 DIAGNOSIS — M5441 Lumbago with sciatica, right side: Secondary | ICD-10-CM | POA: Diagnosis not present

## 2020-08-05 MED ORDER — METHOCARBAMOL 500 MG PO TABS: 500.0000 mg | ORAL_TABLET | Freq: Two times a day (BID) | ORAL | 0 refills | Status: DC | PRN

## 2020-08-05 NOTE — ED Triage Notes (Signed)
Pt reports back pain for awhile and has been going to a PT. Pt went to her MD Thursday but back pain is worse since this weekend.

## 2020-08-05 NOTE — Discharge Instructions (Addendum)
Take the muscle relaxer as needed for muscle spasm; Do not drive, operate machinery, or drink alcohol with this medication as it can cause drowsiness.   Follow up with your primary care provider or orthopedist if your symptoms are not improving.

## 2020-08-05 NOTE — ED Provider Notes (Signed)
[place]    CSN: [id number] Arrival date & time: 08/05/20  [place]      History   Chief Complaint Chief Complaint  Patient presents with  . Back Pain    HPI Tracy Johns is a 45 y.o. female.   Patient presents with bilateral low back pain for more than 1 year; worse for 2-3 months.  No recent falls or injury.  She states the pain radiates down her posterior thighs.  She reports she has previously been seen for this by a Sports Medicine specialist and had physical therapy.  She denies numbness, weakness, saddle anesthesia, loss of bowel/bladder control, redness, bruising, wounds, abdominal pain, dysuria, vaginal discharge, pelvic pain, or other symptoms.  Treatment at home with ibuprofen.  Her medical history includes hypertension, obesity, reactive airway disease, low back pain, depression with anxiety, kidney stones, pyelonephritis, pseudoseizure, pituitary microadenoma, tobacco use.  The history is provided by the patient and medical records.    Past Medical History:  Diagnosis Date  . Dyspnea on exertion   . History of acute pyelonephritis 06/26/2017   w/ sepsis  . History of kidney stones    per pt 2008 and passed spontenaously  . History of pseudoseizure    07-07-2017 per had first episode seizure activity that told to her caused by extreme stress in 2012, 2015 and last one 2017;  per pt was told no medication needed  . Hypercholesterolemia   . Hyperprolactinemia [facility]) dx 2012   endocrinologist-  dr Cruzita Lederer  . Hypertension   . Migraines   . Pituitary microadenoma ([place])    dx per MRI 06/ 2018  . Wears glasses     Patient Active Problem List   Diagnosis Date Noted  . Class 2 obesity due to excess calories with body mass index (BMI) of 38.0 to 38.9 in adult 03/12/2020  . Elevated LDL cholesterol level 03/12/2020  . Reactive airway disease 09/19/2019  . Left low back pain 08/17/2019  . Elevated glucose 08/17/2019  . Depression with anxiety  07/04/2019  . Tobacco use 07/04/2019  . Abnormal thyroid function test 10/06/2017  . Nephrolithiasis 06/27/2017  . Hypokalemia 06/27/2017  . Hyponatremia 06/27/2017  . Essential hypertension 06/27/2017  . Sepsis ([place]) 06/27/2017  . Pyelonephritis 06/26/2017  . Empty sella ([place]) 04/06/2017  . Prolactinoma ([place]) 04/06/2017  . High serum adrenocorticotropic hormone (ACTH) 04/06/2017  . Hyperprolactinemia ([place]) 10/02/2016    Past Surgical History:  Procedure Laterality Date  . CESAREAN SECTION  x2  last one 2000   Bilateral Tubal Ligation w/ last c/s  . CYSTOSCOPY/RETROGRADE/URETEROSCOPY Right 06/26/2017   Procedure: CYSTOSCOPY RIGHT RETROGRADE RIGHT CYSTOSCOPY;  Surgeon: Festus Aloe, MD;  Location: [facility];  Service: Urology;  Laterality: Right;  . CYSTOSCOPY/URETEROSCOPY/HOLMIUM LASER/STENT PLACEMENT Right 07/10/2017   Procedure: CYSTOSCOPY/URETEROSCOPY/HOLMIUM LASER/STENT EXCHANGE;  Surgeon: Festus Aloe, MD;  Location: [facility];  Service: Urology;  Laterality: Right;    OB History   No obstetric history on file.      Home Medications    Prior to Admission medications   Medication Sig Start Date End Date Taking? Authorizing Provider  methocarbamol (ROBAXIN) 500 MG tablet Take 1 tablet (500 mg total) by mouth 2 (two) times daily as needed for muscle spasms. 08/05/20  Yes Sharion Balloon, NP  acetaminophen (TYLENOL) 500 MG tablet Take 500 mg by mouth every 6 (six) hours as needed.    [provider]  albuterol (VENTOLIN HFA) 108 (90 Base) MCG/ACT inhaler INHALE 1 TO  2 PUFFS INTO THE LUNGS EVERY 6 HOURS AS NEEDED FOR WHEEZING OR SHORTNESS OF BREATH 10/11/19   Libby Maw, MD  aspirin-acetaminophen-caffeine [facility] MIGRAINE) [phone] MG tablet Take by mouth every 6 (six) hours as needed for headache.    [provider]  FLUoxetine (PROZAC) 40 MG capsule Take 1 capsule (40 mg total) by mouth daily. 07/26/20   Libby Maw,  MD  hydrochlorothiazide (HYDRODIURIL) 25 MG tablet Take 1 tablet (25 mg total) by mouth daily. 07/26/20   Libby Maw, MD  telmisartan (MICARDIS) 40 MG tablet TAKE 1 TABLET(40 MG) BY MOUTH DAILY 03/12/20   Libby Maw, MD    Family History Family History  Problem Relation Age of Onset  . Hypertension Other   . Stroke Other   . Heart failure Other   . Heart attack Mother   . Stroke Father     Social History Social History   Tobacco Use  . Smoking status: Current Every Day Smoker    Packs/day: 0.50    Years: 6.00    Pack years: 3.00    Types: Cigarettes  . Smokeless tobacco: Never Used  Vaping Use  . Vaping Use: Never used  Substance Use Topics  . Alcohol use: Yes    Comment: occa  . Drug use: No     Allergies   Imitrex [sumatriptan], Oxycodone, and Percocet [oxycodone-acetaminophen]   Review of Systems Review of Systems  Constitutional: Negative for chills and fever.  HENT: Negative for ear pain and sore throat.   Eyes: Negative for pain and visual disturbance.  Respiratory: Negative for cough and shortness of breath.   Cardiovascular: Negative for chest pain and palpitations.  Gastrointestinal: Negative for abdominal pain and vomiting.  Genitourinary: Negative for dysuria, hematuria, pelvic pain and vaginal discharge.  Musculoskeletal: Positive for back pain. Negative for arthralgias and gait problem.  Skin: Negative for color change and rash.  Neurological: Negative for syncope, weakness and numbness.  All other systems reviewed and are negative.    Physical Exam Triage Vital Signs ED Triage Vitals  Enc Vitals Group     BP      Pulse      Resp      Temp      Temp src      SpO2      Weight      Height      Head Circumference      Peak Flow      Pain Score      Pain Loc      Pain Edu?      Excl. in [place]?    No data found.  Updated Vital Signs BP 111/76 (BP Location: Left Arm)   Pulse 91   Temp 98.5 F (36.9 C) (Oral)    Resp 20   SpO2 100%   Visual Acuity Right Eye Distance:   Left Eye Distance:   Bilateral Distance:    Right Eye Near:   Left Eye Near:    Bilateral Near:     Physical Exam Vitals and nursing note reviewed.  Constitutional:      General: She is not in acute distress.    Appearance: She is well-developed. She is obese. She is not ill-appearing.  HENT:     Head: Normocephalic and atraumatic.     Mouth/Throat:     Mouth: Mucous membranes are moist.  Eyes:     Conjunctiva/sclera: Conjunctivae normal.  Cardiovascular:     Rate and  Rhythm: Normal rate and regular rhythm.     Heart sounds: Normal heart sounds.  Pulmonary:     Effort: Pulmonary effort is normal. No respiratory distress.     Breath sounds: Normal breath sounds.  Abdominal:     Palpations: Abdomen is soft.     Tenderness: There is no abdominal tenderness. There is no right CVA tenderness, left CVA tenderness, guarding or rebound.  Musculoskeletal:        General: No swelling, tenderness or deformity. Normal range of motion.     Cervical back: Neck supple.  Skin:    General: Skin is warm and dry.     Capillary Refill: Capillary refill takes less than 2 seconds.     Findings: No bruising, erythema, lesion or rash.  Neurological:     General: No focal deficit present.     Mental Status: She is alert and oriented to person, place, and time.     Sensory: No sensory deficit.     Motor: No weakness.     Gait: Gait normal.     Comments: Negative straight leg raise.  Psychiatric:        Mood and Affect: Mood normal.        Behavior: Behavior normal.      UC Treatments / Results  Labs (all labs ordered are listed, but only abnormal results are displayed) Labs Reviewed - No data to display  EKG   Radiology No results found.  Procedures Procedures (including critical care time)  Medications Ordered in UC Medications - No data to display  Initial Impression / Assessment and Plan / UC Course  I have  reviewed the triage vital signs and the nursing notes.  Pertinent labs & imaging results that were available during my care of the patient were reviewed by me and considered in my medical decision making (see chart for details).   Chronic low back pain with bilateral sciatica.  Instructed patient to continue taking ibuprofen at home.  Also treating with methocarbamol; precautions for drowsiness with this medication discussed.  Instructed her to follow-up with her PCP or orthopedist if her symptoms are not improving.  She agrees to plan of care.   Final Clinical Impressions(s) / UC Diagnoses   Final diagnoses:  Chronic bilateral low back pain with bilateral sciatica     Discharge Instructions     Take the muscle relaxer as needed for muscle spasm; Do not drive, operate machinery, or drink alcohol with this medication as it can cause drowsiness.   Follow up with your primary care provider or orthopedist if your symptoms are not improving.        ED Prescriptions    Medication Sig Dispense Auth. Provider   methocarbamol (ROBAXIN) 500 MG tablet Take 1 tablet (500 mg total) by mouth 2 (two) times daily as needed for muscle spasms. 10 tablet Sharion Balloon, NP     I have reviewed the PDMP during this encounter.   Sharion Balloon, NP 08/05/20 [phone]

## 2020-08-06 ENCOUNTER — Encounter: Payer: Self-pay | Admitting: Family Medicine

## 2020-08-06 DIAGNOSIS — Z0279 Encounter for issue of other medical certificate: Secondary | ICD-10-CM

## 2020-08-13 ENCOUNTER — Encounter: Payer: Self-pay | Admitting: Family Medicine

## 2020-08-13 NOTE — Telephone Encounter (Signed)
Please see message and advise.  Thank you. ° °

## 2020-09-04 NOTE — Telephone Encounter (Signed)
I called pt and informed her-her 09/06/20 appointment has cancelled

## 2020-09-06 ENCOUNTER — Ambulatory Visit: Payer: BC Managed Care – PPO | Admitting: Family Medicine

## 2020-11-13 ENCOUNTER — Other Ambulatory Visit: Payer: Self-pay | Admitting: Family Medicine

## 2020-11-13 ENCOUNTER — Encounter: Payer: Self-pay | Admitting: Family Medicine

## 2020-12-03 ENCOUNTER — Other Ambulatory Visit: Payer: Self-pay | Admitting: Family Medicine

## 2020-12-03 DIAGNOSIS — I10 Essential (primary) hypertension: Secondary | ICD-10-CM

## 2020-12-03 NOTE — Telephone Encounter (Signed)
Chart supports Rx, pharmacy to inform patient to make f/u appointment.

## 2021-02-05 ENCOUNTER — Other Ambulatory Visit: Payer: Self-pay | Admitting: Family Medicine

## 2021-02-07 MED ORDER — METHOCARBAMOL 500 MG PO TABS: 500.0000 mg | ORAL_TABLET | Freq: Three times a day (TID) | ORAL | 0 refills | Status: DC | PRN

## 2021-02-17 ENCOUNTER — Ambulatory Visit (INDEPENDENT_AMBULATORY_CARE_PROVIDER_SITE_OTHER): Payer: 59

## 2021-02-17 ENCOUNTER — Emergency Department (HOSPITAL_COMMUNITY)
Admission: EM | Admit: 2021-02-17 | Discharge: 2021-02-17 | Disposition: A | Discharge status: Home / Self Care | Payer: 59 | Attending: Physician Assistant | Admitting: Physician Assistant

## 2021-02-17 ENCOUNTER — Encounter (HOSPITAL_COMMUNITY): Payer: Self-pay

## 2021-02-17 ENCOUNTER — Ambulatory Visit (HOSPITAL_COMMUNITY)
Admission: EM | Admit: 2021-02-17 | Discharge: 2021-02-17 | Disposition: A | Discharge status: Home / Self Care | Payer: 59 | Attending: Physician Assistant | Admitting: Physician Assistant

## 2021-02-17 ENCOUNTER — Other Ambulatory Visit: Payer: Self-pay

## 2021-02-17 ENCOUNTER — Encounter (HOSPITAL_COMMUNITY): Payer: Self-pay | Admitting: Emergency Medicine

## 2021-02-17 ENCOUNTER — Emergency Department (HOSPITAL_COMMUNITY): Payer: 59

## 2021-02-17 DIAGNOSIS — W19XXXA Unspecified fall, initial encounter: Secondary | ICD-10-CM

## 2021-02-17 DIAGNOSIS — S52122A Displaced fracture of head of left radius, initial encounter for closed fracture: Secondary | ICD-10-CM | POA: Diagnosis not present

## 2021-02-17 DIAGNOSIS — W010XXA Fall on same level from slipping, tripping and stumbling without subsequent striking against object, initial encounter: Secondary | ICD-10-CM | POA: Diagnosis not present

## 2021-02-17 DIAGNOSIS — M25532 Pain in left wrist: Secondary | ICD-10-CM

## 2021-02-17 DIAGNOSIS — Z5321 Procedure and treatment not carried out due to patient leaving prior to being seen by health care provider: Secondary | ICD-10-CM | POA: Diagnosis not present

## 2021-02-17 DIAGNOSIS — M79632 Pain in left forearm: Secondary | ICD-10-CM | POA: Insufficient documentation

## 2021-02-17 DIAGNOSIS — M79602 Pain in left arm: Secondary | ICD-10-CM

## 2021-02-17 NOTE — ED Provider Notes (Signed)
[place]    CSN: [id number] Arrival date & time: 02/17/21  1349      History   Chief Complaint No chief complaint on file.   HPI Tracy Johns is a 45 y.o. female.   Patient presents today with an 11-day history of pain following injury.  Reports that she was at the park on 02/06/2021 when a group of 8 pimples without collars or owners started charging towards her and her puppy.  She immediately picked up her dog and ran but tripped and fell with the majority of her weight landing on her left arm.  Since that time she has had ongoing pain in her left forearm/elbow as well as her wrist.  She is right-handed.  She has tried Tylenol and ibuprofen as well as heat with improvement but not resolution of symptoms.  She does report decreased range of motion at the elbow due to pain.  Denies any numbness or paresthesias.  She has been using a sling as well as wrap with improvement but not resolution of symptoms.  She did go to the emergency room earlier today and had x-rays but was told it would be an 18 to 20-hour wait and so she declined to stay and so presented to our clinic.  She has not seen orthopedic provider regarding symptoms.   Past Medical History:  Diagnosis Date   Dyspnea on exertion    History of acute pyelonephritis 06/26/2017   w/ sepsis   History of kidney stones    per pt 2008 and passed spontenaously   History of pseudoseizure    07-07-2017 per had first episode seizure activity that told to her caused by extreme stress in 2012, 2015 and last one 2017;  per pt was told no medication needed   Hypercholesterolemia    Hyperprolactinemia [facility]) dx 2012   endocrinologist-  dr Cruzita Lederer   Hypertension    Migraines    Pituitary microadenoma ([place])    dx per MRI 06/ 2018   Wears glasses     Patient Active Problem List   Diagnosis Date Noted   Class 2 obesity due to excess calories with body mass index (BMI) of 38.0 to 38.9 in adult 03/12/2020   Elevated  LDL cholesterol level 03/12/2020   Reactive airway disease 09/19/2019   Left low back pain 08/17/2019   Elevated glucose 08/17/2019   Depression with anxiety 07/04/2019   Tobacco use 07/04/2019   Abnormal thyroid function test 10/06/2017   Nephrolithiasis 06/27/2017   Hypokalemia 06/27/2017   Hyponatremia 06/27/2017   Essential hypertension 06/27/2017   Sepsis ([place]) 06/27/2017   Pyelonephritis 06/26/2017   Empty sella ([place]) 04/06/2017   Prolactinoma ([place]) 04/06/2017   High serum adrenocorticotropic hormone (ACTH) 04/06/2017   Hyperprolactinemia ([place]) 10/02/2016    Past Surgical History:  Procedure Laterality Date   CESAREAN SECTION  x2  last one 2000   Bilateral Tubal Ligation w/ last c/s   CYSTOSCOPY/RETROGRADE/URETEROSCOPY Right 06/26/2017   Procedure: CYSTOSCOPY RIGHT RETROGRADE RIGHT CYSTOSCOPY;  Surgeon: Festus Aloe, MD;  [facility];  Service: Urology;  Laterality: Right;   CYSTOSCOPY/URETEROSCOPY/HOLMIUM LASER/STENT PLACEMENT Right 07/10/2017   Procedure: CYSTOSCOPY/URETEROSCOPY/HOLMIUM LASER/STENT EXCHANGE;  Surgeon: Festus Aloe, MD;  [facility];  Service: Urology;  Laterality: Right;    OB History   No obstetric history on file.      Home Medications    Prior to Admission medications   Medication Sig Start Date End Date Taking? Authorizing Provider  methocarbamol (ROBAXIN)  500 MG tablet Take 1 tablet (500 mg total) by mouth every 8 (eight) hours as needed for muscle spasms. 02/07/21   Libby Maw, MD  acetaminophen (TYLENOL) 500 MG tablet Take 500 mg by mouth every 6 (six) hours as needed.    [provider]  albuterol (VENTOLIN HFA) 108 (90 Base) MCG/ACT inhaler INHALE 1 TO 2 PUFFS INTO THE LUNGS EVERY 6 HOURS AS NEEDED FOR WHEEZING OR SHORTNESS OF BREATH 10/11/19   Libby Maw, MD  aspirin-acetaminophen-caffeine (EXCEDRIN MIGRAINE) [phone] MG tablet Take by mouth every 6 (six) hours as needed  for headache.    [provider]  FLUoxetine (PROZAC) 40 MG capsule Take 1 capsule (40 mg total) by mouth daily. 07/26/20   Libby Maw, MD  hydrochlorothiazide (HYDRODIURIL) 25 MG tablet Take 1 tablet (25 mg total) by mouth daily. 07/26/20   Libby Maw, MD  telmisartan (MICARDIS) 40 MG tablet TAKE 1 TABLET(40 MG) BY MOUTH DAILY 12/03/20   Libby Maw, MD    Family History Family History  Problem Relation Age of Onset   Hypertension Other    Stroke Other    Heart failure Other    Heart attack Mother    Stroke Father     Social History Social History   Tobacco Use   Smoking status: Every Day    Packs/day: 0.50    Years: 6.00    Pack years: 3.00    Types: Cigarettes   Smokeless tobacco: Never  Vaping Use   Vaping Use: Never used  Substance Use Topics   Alcohol use: Yes    Comment: occa   Drug use: No     Allergies   Imitrex [sumatriptan], Oxycodone, and Percocet [oxycodone-acetaminophen]   Review of Systems Review of Systems  Constitutional:  Positive for activity change. Negative for appetite change, fatigue and fever.  Respiratory:  Negative for cough and shortness of breath.   Cardiovascular:  Negative for chest pain.  Gastrointestinal:  Negative for abdominal pain, diarrhea, nausea and vomiting.  Musculoskeletal:  Positive for arthralgias and joint swelling. Negative for myalgias.  Neurological:  Negative for dizziness, weakness, light-headedness, numbness and headaches.    Physical Exam Triage Vital Signs ED Triage Vitals  Enc Vitals Group     BP 02/17/21 1538 124/74     Pulse Rate 02/17/21 1538 74     Resp 02/17/21 1538 18     Temp 02/17/21 1538 98.7 F (37.1 C)     Temp Source 02/17/21 1538 Oral     SpO2 02/17/21 1538 100 %     Weight --      Height --      Head Circumference --      Peak Flow --      Pain Score 02/17/21 1536 10     Pain Loc --      Pain Edu? --      Excl. in [place]? --    No data  found.  Updated Vital Signs BP 124/74 (BP Location: Right Arm)   Pulse 74   Temp 98.7 F (37.1 C) (Oral)   Resp 18   SpO2 100%   Visual Acuity Right Eye Distance:   Left Eye Distance:   Bilateral Distance:    Right Eye Near:   Left Eye Near:    Bilateral Near:     Physical Exam Vitals reviewed.  Constitutional:      General: She is awake. She is not in acute distress.  Appearance: Normal appearance. She is well-developed. She is not ill-appearing.     Comments: Very pleasant female appears to be age no acute distress sitting comfortably in exam room  HENT:     Head: Normocephalic and atraumatic.  Cardiovascular:     Rate and Rhythm: Normal rate and regular rhythm.     Pulses:          Radial pulses are 2+ on the right side and 2+ on the left side.     Heart sounds: Normal heart sounds, S1 normal and S2 normal. No murmur heard. Pulmonary:     Effort: Pulmonary effort is normal.     Breath sounds: Normal breath sounds. No wheezing, rhonchi or rales.     Comments: Clear to auscultation bilaterally Abdominal:     Palpations: Abdomen is soft.     Tenderness: There is no abdominal tenderness.  Musculoskeletal:     Right forearm: Swelling, tenderness and bony tenderness present.     Right wrist: Swelling, tenderness, bony tenderness and snuff box tenderness present. Normal range of motion.     Right hand: No swelling. Normal strength. There is no disruption of two-point discrimination. Normal capillary refill.     Comments: Left wrist/arm pain: Swelling and tenderness palpation over radial forearm.  Tenderness palpation over dorsal wrist with snuffbox tenderness.  Normal active range of motion at wrist but decreased range of motion with flexion at elbow.  Hand neurovascularly intact.  Psychiatric:        Behavior: Behavior is cooperative.     UC Treatments / Results  Labs (all labs ordered are listed, but only abnormal results are displayed) Labs Reviewed - No data to  display  EKG   Radiology DG Forearm Left  Result Date: 02/17/2021 CLINICAL DATA:  Left arm pain. EXAM: LEFT FOREARM - 2 VIEW COMPARISON:  None. FINDINGS: There is a mildly comminuted impacted fracture of the radial head with intra-articular extension to the articulation of the radius and capitellum. There is a small joint effusion. No other fractures are seen. IMPRESSION: 1. Mildly comminuted impacted intra-articular fracture of the radial head. 2. Small joint effusion. Electronically Signed   By: Fidela Salisbury M.D.   On: 02/17/2021 13:27   DG Wrist Complete Left  Result Date: 02/17/2021 CLINICAL DATA:  Fall 11 days ago.  Snuffbox tenderness. EXAM: LEFT WRIST - COMPLETE 3+ VIEW COMPARISON:  None. FINDINGS: There is no evidence of fracture or dislocation. There is no evidence of arthropathy or other focal bone abnormality. Soft tissues are unremarkable. IMPRESSION: Negative. Electronically Signed   By: Rolm Baptise M.D.   On: 02/17/2021 16:39    Procedures Procedures (including critical care time)  Medications Ordered in UC Medications - No data to display  Initial Impression / Assessment and Plan / UC Course  I have reviewed the triage vital signs and the nursing notes.  Pertinent labs & imaging results that were available during my care of the patient were reviewed by me and considered in my medical decision making (see chart for details).      X-ray obtained in the emergency room showed comminuted fracture of radial head consistent with Mason III.  Patient was put in long-arm splint by [facility] and encouraged to follow-up with orthopedics first thing tomorrow.  She was given contact information for local provider.  She can continue over-the-counter medications for symptom relief.  X-ray of wrist was obtained given tenderness palpation which showed no acute abnormalities.  Discussed alarm symptoms that warrant  emergent evaluation.  Strict return precautions given to which she  expressed understanding.  Final Clinical Impressions(s) / UC Diagnoses   Final diagnoses:  Fall, initial encounter  Left arm pain  Left wrist pain  Closed displaced fracture of head of left radius, initial encounter     Discharge Instructions      Your wrist x-ray was normal.  Your x-ray of your forearm showed a fracture of your radial head.  We are placing you in a splint.  Please remain in the splint until you are seen by orthopedics.  Please call them to schedule appointment first thing tomorrow.  Keep your arm elevated and use over-the-counter medications (Tylenol and ibuprofen) for symptom relief.  If you have any numbness or tingling in your hand or difficulty moving you need to go to the emergency room.     ED Prescriptions   None    PDMP not reviewed this encounter.   Terrilee Croak, PA-C 02/17/21 1700

## 2021-02-17 NOTE — ED Triage Notes (Signed)
Pt presents with pain in the left forearm x 11 days. Pt reports she fell over the left arm 11 days ago when she was trying to protect her puppy from 43 Pitbull dogs.  Heating pads gives some relief.

## 2021-02-17 NOTE — Progress Notes (Signed)
Orthopedic Tech Progress Note Patient Details:  Tracy Johns 1976/05/11 [id number]  Ortho Devices Type of Ortho Device: Long arm splint Ortho Device/Splint Location: lue Ortho Device/Splint Interventions: Ordered, Application, Adjustment   Post Interventions Patient Tolerated: Well Instructions Provided: Care of device, Poper ambulation with device  [name] 02/17/2021, 5:27 PM

## 2021-02-17 NOTE — ED Triage Notes (Signed)
Pt tripped and fell 11 days ago.  C/o pain to L forearm since fall.  CMS intact.

## 2021-02-17 NOTE — Discharge Instructions (Signed)
Your wrist x-ray was normal.  Your x-ray of your forearm showed a fracture of your radial head.  We are placing you in a splint.  Please remain in the splint until you are seen by orthopedics.  Please call them to schedule appointment first thing tomorrow.  Keep your arm elevated and use over-the-counter medications (Tylenol and ibuprofen) for symptom relief.  If you have any numbness or tingling in your hand or difficulty moving you need to go to the emergency room.

## 2021-02-23 ENCOUNTER — Ambulatory Visit (HOSPITAL_COMMUNITY): Admission: EM | Admit: 2021-02-23 | Discharge: 2021-02-23 | Discharge status: Against Medical Advice | Payer: 59

## 2021-03-18 ENCOUNTER — Ambulatory Visit (INDEPENDENT_AMBULATORY_CARE_PROVIDER_SITE_OTHER)
Admission: EM | Admit: 2021-03-18 | Discharge: 2021-03-18 | Disposition: A | Discharge status: Home / Self Care | Payer: 59 | Source: Home / Self Care | Attending: Internal Medicine | Admitting: Internal Medicine

## 2021-03-18 ENCOUNTER — Encounter (HOSPITAL_COMMUNITY): Payer: Self-pay | Admitting: Emergency Medicine

## 2021-03-18 DIAGNOSIS — E1165 Type 2 diabetes mellitus with hyperglycemia: Secondary | ICD-10-CM | POA: Diagnosis not present

## 2021-03-18 DIAGNOSIS — E1110 Type 2 diabetes mellitus with ketoacidosis without coma: Secondary | ICD-10-CM

## 2021-03-18 DIAGNOSIS — E119 Type 2 diabetes mellitus without complications: Secondary | ICD-10-CM | POA: Insufficient documentation

## 2021-03-18 LAB — CBC WITH DIFFERENTIAL/PLATELET
Abs Immature Granulocytes: 0.08 10*3/uL — ABNORMAL HIGH (ref 0.00–0.07)
Basophils Absolute: 0.1 10*3/uL (ref 0.0–0.1)
Basophils Relative: 1 %
Eosinophils Absolute: 0 10*3/uL (ref 0.0–0.5)
Eosinophils Relative: 0 %
HCT: 43.4 % (ref 36.0–46.0)
Hemoglobin: 14 g/dL (ref 12.0–15.0)
Immature Granulocytes: 1 %
Lymphocytes Relative: 21 %
Lymphs Abs: 2.5 10*3/uL (ref 0.7–4.0)
MCH: 31.2 pg (ref 26.0–34.0)
MCHC: 32.3 g/dL (ref 30.0–36.0)
MCV: 96.7 fL (ref 80.0–100.0)
Monocytes Absolute: 0.8 10*3/uL (ref 0.1–1.0)
Monocytes Relative: 6 %
Neutro Abs: 8.3 10*3/uL — ABNORMAL HIGH (ref 1.7–7.7)
Neutrophils Relative %: 71 %
Platelets: 390 10*3/uL (ref 150–400)
RBC: 4.49 MIL/uL (ref 3.87–5.11)
RDW: 12.2 % (ref 11.5–15.5)
WBC: 11.7 10*3/uL — ABNORMAL HIGH (ref 4.0–10.5)
nRBC: 0 % (ref 0.0–0.2)

## 2021-03-18 LAB — COMPREHENSIVE METABOLIC PANEL WITH GFR
ALT: 31 U/L (ref 0–44)
AST: 20 U/L (ref 15–41)
Albumin: 4.6 g/dL (ref 3.5–5.0)
Alkaline Phosphatase: 101 U/L (ref 38–126)
Anion gap: 19 — ABNORMAL HIGH (ref 5–15)
BUN: 13 mg/dL (ref 6–20)
CO2: 9 mmol/L — ABNORMAL LOW (ref 22–32)
Calcium: 9.1 mg/dL (ref 8.9–10.3)
Chloride: 101 mmol/L (ref 98–111)
Creatinine, Ser: 1.4 mg/dL — ABNORMAL HIGH (ref 0.44–1.00)
GFR, Estimated: 47 mL/min — ABNORMAL LOW
Glucose, Bld: 403 mg/dL — ABNORMAL HIGH (ref 70–99)
Potassium: 4.4 mmol/L (ref 3.5–5.1)
Sodium: 129 mmol/L — ABNORMAL LOW (ref 135–145)
Total Bilirubin: 1.7 mg/dL — ABNORMAL HIGH (ref 0.3–1.2)
Total Protein: 8.8 g/dL — ABNORMAL HIGH (ref 6.5–8.1)

## 2021-03-18 LAB — HEMOGLOBIN A1C
Hgb A1c MFr Bld: 12 % — ABNORMAL HIGH (ref 4.8–5.6)
Mean Plasma Glucose: 297.7 mg/dL

## 2021-03-18 LAB — CBG MONITORING, ED: Glucose-Capillary: 401 mg/dL — ABNORMAL HIGH (ref 70–99)

## 2021-03-18 MED ORDER — SODIUM CHLORIDE 0.9 % IV BOLUS
1000.0000 mL | Freq: Once | INTRAVENOUS | Status: AC
  Administered 2021-03-18: 1000 mL via INTRAVENOUS

## 2021-03-18 MED ORDER — KETOROLAC TROMETHAMINE 30 MG/ML IJ SOLN
30.0000 mg | Freq: Once | INTRAMUSCULAR | Status: AC
  Administered 2021-03-18: 30 mg via INTRAVENOUS

## 2021-03-18 MED ORDER — KETOROLAC TROMETHAMINE 60 MG/2ML IM SOLN: 30.0000 mg | Freq: Once | INTRAMUSCULAR | Status: DC

## 2021-03-18 MED ORDER — METFORMIN HCL 500 MG PO TABS: 500.0000 mg | ORAL_TABLET | Freq: Two times a day (BID) | ORAL | 0 refills | Status: DC

## 2021-03-18 MED ORDER — KETOROLAC TROMETHAMINE 30 MG/ML IJ SOLN
INTRAMUSCULAR | Status: AC
  Filled 2021-03-18: qty 1

## 2021-03-18 NOTE — ED Provider Notes (Addendum)
[place]    CSN: [id number] Arrival date & time: 03/18/21  0849      History   Chief Complaint Chief Complaint  Patient presents with   Hyperglycemia   Fatigue    HPI Tracy Johns is a 45 y.o. female comes to the urgent care with 2 weeks history of general malaise, increased urination, increased thirst and increased oral intake.  Patient says the symptoms have been worsening over the past 2 weeks.  Yesterday she started experiencing generalized cramping in both lower extremities and lower back.  She denies any vomiting except for 1 episode of vomiting yesterday.  No diarrhea.  No weight loss.  No fever or chills.   HPI  Past Medical History:  Diagnosis Date   Dyspnea on exertion    History of acute pyelonephritis 06/26/2017   w/ sepsis   History of kidney stones    per pt 2008 and passed spontenaously   History of pseudoseizure    07-07-2017 per had first episode seizure activity that told to her caused by extreme stress in 2012, 2015 and last one 2017;  per pt was told no medication needed   Hypercholesterolemia    Hyperprolactinemia [facility]) dx 2012   endocrinologist-  dr Cruzita Lederer   Hypertension    Migraines    Pituitary microadenoma ([place])    dx per MRI 06/ 2018   Wears glasses     Patient Active Problem List   Diagnosis Date Noted   Class 2 obesity due to excess calories with body mass index (BMI) of 38.0 to 38.9 in adult 03/12/2020   Elevated LDL cholesterol level 03/12/2020   Reactive airway disease 09/19/2019   Left low back pain 08/17/2019   Elevated glucose 08/17/2019   Depression with anxiety 07/04/2019   Tobacco use 07/04/2019   Abnormal thyroid function test 10/06/2017   Nephrolithiasis 06/27/2017   Hypokalemia 06/27/2017   Hyponatremia 06/27/2017   Essential hypertension 06/27/2017   Sepsis ([place]) 06/27/2017   Pyelonephritis 06/26/2017   Empty sella ([place]) 04/06/2017   Prolactinoma ([place]) 04/06/2017   High serum  adrenocorticotropic hormone (ACTH) 04/06/2017   Hyperprolactinemia ([place]) 10/02/2016    Past Surgical History:  Procedure Laterality Date   CESAREAN SECTION  x2  last one 2000   Bilateral Tubal Ligation w/ last c/s   CYSTOSCOPY/RETROGRADE/URETEROSCOPY Right 06/26/2017   Procedure: CYSTOSCOPY RIGHT RETROGRADE RIGHT CYSTOSCOPY;  Surgeon: Festus Aloe, MD;  Location: [facility];  Service: Urology;  Laterality: Right;   CYSTOSCOPY/URETEROSCOPY/HOLMIUM LASER/STENT PLACEMENT Right 07/10/2017   Procedure: CYSTOSCOPY/URETEROSCOPY/HOLMIUM LASER/STENT EXCHANGE;  Surgeon: Festus Aloe, MD;  Location: [facility];  Service: Urology;  Laterality: Right;    OB History   No obstetric history on file.      Home Medications    Prior to Admission medications   Medication Sig Start Date End Date Taking? Authorizing Provider  metFORMIN (GLUCOPHAGE) 500 MG tablet Take 1 tablet (500 mg total) by mouth 2 (two) times daily with a meal. 03/18/21 04/17/21 Yes Kasidi Shanker, Myrene Galas, MD  methocarbamol (ROBAXIN) 500 MG tablet Take 1 tablet (500 mg total) by mouth every 8 (eight) hours as needed for muscle spasms. 02/07/21   Libby Maw, MD  acetaminophen (TYLENOL) 500 MG tablet Take 500 mg by mouth every 6 (six) hours as needed.    [provider]  albuterol (VENTOLIN HFA) 108 (90 Base) MCG/ACT inhaler INHALE 1 TO 2 PUFFS INTO THE LUNGS EVERY 6 HOURS AS NEEDED FOR WHEEZING OR SHORTNESS OF BREATH  10/11/19   Libby Maw, MD  aspirin-acetaminophen-caffeine (EXCEDRIN MIGRAINE) [phone] MG tablet Take by mouth every 6 (six) hours as needed for headache.    [provider]  FLUoxetine (PROZAC) 40 MG capsule Take 1 capsule (40 mg total) by mouth daily. 07/26/20   Libby Maw, MD  hydrochlorothiazide (HYDRODIURIL) 25 MG tablet Take 1 tablet (25 mg total) by mouth daily. 07/26/20   Libby Maw, MD  telmisartan (MICARDIS) 40 MG tablet TAKE 1 TABLET(40 MG)  BY MOUTH DAILY 12/03/20   Libby Maw, MD    Family History Family History  Problem Relation Age of Onset   Hypertension Other    Stroke Other    Heart failure Other    Heart attack Mother    Stroke Father     Social History Social History   Tobacco Use   Smoking status: Every Day    Packs/day: 0.50    Years: 6.00    Pack years: 3.00    Types: Cigarettes   Smokeless tobacco: Never  Vaping Use   Vaping Use: Never used  Substance Use Topics   Alcohol use: Yes    Comment: occa   Drug use: No     Allergies   Imitrex [sumatriptan], Oxycodone, and Percocet [oxycodone-acetaminophen]   Review of Systems Review of Systems  HENT: Negative.    Eyes: Negative.   Respiratory: Negative.    Gastrointestinal: Negative.  Negative for abdominal pain.  Genitourinary: Negative.  Negative for dysuria and vaginal discharge.  Neurological:  Positive for dizziness and light-headedness.    Physical Exam Triage Vital Signs ED Triage Vitals  Enc Vitals Group     BP 03/18/21 1025 (!) 135/95     Pulse Rate 03/18/21 1025 (!) 108     Resp 03/18/21 1025 18     Temp 03/18/21 1025 99.1 F (37.3 C)     Temp Source 03/18/21 1025 Oral     SpO2 03/18/21 1025 100 %     Weight --      Height --      Head Circumference --      Peak Flow --      Pain Score 03/18/21 1030 10     Pain Loc --      Pain Edu? --      Excl. in [place]? --    No data found.  Updated Vital Signs BP (!) 135/95 (BP Location: Left Arm)   Pulse (!) 108   Temp 99.1 F (37.3 C) (Oral)   Resp 18   SpO2 100%   Visual Acuity Right Eye Distance:   Left Eye Distance:   Bilateral Distance:    Right Eye Near:   Left Eye Near:    Bilateral Near:     Physical Exam Vitals and nursing note reviewed.  Constitutional:      General: She is not in acute distress.    Appearance: She is ill-appearing.  HENT:     Right Ear: Tympanic membrane normal.     Left Ear: Tympanic membrane normal.  Cardiovascular:      Rate and Rhythm: Normal rate and regular rhythm.  Musculoskeletal:        General: Normal range of motion.  Skin:    General: Skin is warm.  Neurological:     Mental Status: She is alert.     UC Treatments / Results  Labs (all labs ordered are listed, but only abnormal results are displayed) Labs Reviewed  CBC WITH DIFFERENTIAL/PLATELET -  Abnormal; Notable for the following components:      Result Value   WBC 11.7 (*)    Neutro Abs 8.3 (*)    Abs Immature Granulocytes 0.08 (*)    All other components within normal limits  COMPREHENSIVE METABOLIC PANEL - Abnormal; Notable for the following components:   Sodium 129 (*)    CO2 9 (*)    Glucose, Bld 403 (*)    Creatinine, Ser 1.40 (*)    Total Protein 8.8 (*)    Total Bilirubin 1.7 (*)    GFR, Estimated 47 (*)    Anion gap 19 (*)    All other components within normal limits  HEMOGLOBIN A1C - Abnormal; Notable for the following components:   Hgb A1c MFr Bld 12.0 (*)    All other components within normal limits  CBG MONITORING, ED - Abnormal; Notable for the following components:   Glucose-Capillary 401 (*)    All other components within normal limits    EKG   Radiology No results found.  Procedures Procedures (including critical care time)  Medications Ordered in UC Medications  sodium chloride 0.9 % bolus 1,000 mL (0 mLs Intravenous Stopped 03/18/21 1224)  ketorolac (TORADOL) 30 MG/ML injection 30 mg (30 mg Intravenous Given 03/18/21 1139)    Initial Impression / Assessment and Plan / UC Course  I have reviewed the triage vital signs and the nursing notes.  Pertinent labs & imaging results that were available during my care of the patient were reviewed by me and considered in my medical decision making (see chart for details).     1.  Newly diagnosed type 2 diabetes mellitus: 2.  Diabetic ketoacidosis Normal saline 1 L x 1 Total time for normal saline bolus was 37 minutes CBC, CMP, hemoglobin A1c Metformin  500 mg twice daily Increase oral fluid intake Diabetic diet Lab results available shows that the patient is in diabetic ketoacidosis evidenced by an increased anion gap and metabolic acidosis.  Patient is advised to go to the emergency department for hospitalization Final Clinical Impressions(s) / UC Diagnoses   Final diagnoses:  Newly diagnosed diabetes [facility])     Discharge Instructions      Please follow-up with your primary care physician for further education and management Maintain adequate hydration We will call you with recommendations if labs are abnormal      ED Prescriptions     Medication Sig Dispense Auth. Provider   metFORMIN (GLUCOPHAGE) 500 MG tablet Take 1 tablet (500 mg total) by mouth 2 (two) times daily with a meal. 60 tablet Eric Morganti, Myrene Galas, MD      PDMP not reviewed this encounter.   Chase Picket, MD 03/18/21 1513    Chase Picket, MD 03/18/21 1514    Chase Picket, MD 03/18/21 [phone]

## 2021-03-18 NOTE — Discharge Instructions (Addendum)
Please follow-up with your primary care physician for further education and management Maintain adequate hydration We will call you with recommendations if labs are abnormal

## 2021-03-18 NOTE — ED Triage Notes (Signed)
Not feeling well for two weeks. Having nausea, lightheadedness. Reports husband is diabetic and checked pt's blood sugar yesterday it was in 500s. Reports today was 400s.

## 2021-03-19 ENCOUNTER — Encounter (HOSPITAL_COMMUNITY): Payer: Self-pay

## 2021-03-19 ENCOUNTER — Inpatient Hospital Stay (HOSPITAL_COMMUNITY)
Admission: EM | Admit: 2021-03-19 | Discharge: 2021-03-21 | DRG: 638 | Disposition: A | Discharge status: Home / Self Care | Payer: 59 | Attending: Internal Medicine | Admitting: Internal Medicine

## 2021-03-19 ENCOUNTER — Other Ambulatory Visit: Payer: Self-pay

## 2021-03-19 DIAGNOSIS — Z6841 Body Mass Index (BMI) 40.0 and over, adult: Secondary | ICD-10-CM

## 2021-03-19 DIAGNOSIS — E669 Obesity, unspecified: Secondary | ICD-10-CM | POA: Diagnosis present

## 2021-03-19 DIAGNOSIS — E7800 Pure hypercholesterolemia, unspecified: Secondary | ICD-10-CM | POA: Diagnosis present

## 2021-03-19 DIAGNOSIS — Z79899 Other long term (current) drug therapy: Secondary | ICD-10-CM

## 2021-03-19 DIAGNOSIS — Z20822 Contact with and (suspected) exposure to covid-19: Secondary | ICD-10-CM | POA: Diagnosis present

## 2021-03-19 DIAGNOSIS — E876 Hypokalemia: Secondary | ICD-10-CM | POA: Diagnosis present

## 2021-03-19 DIAGNOSIS — G43909 Migraine, unspecified, not intractable, without status migrainosus: Secondary | ICD-10-CM | POA: Diagnosis present

## 2021-03-19 DIAGNOSIS — E1110 Type 2 diabetes mellitus with ketoacidosis without coma: Secondary | ICD-10-CM

## 2021-03-19 DIAGNOSIS — E221 Hyperprolactinemia: Secondary | ICD-10-CM | POA: Diagnosis present

## 2021-03-19 DIAGNOSIS — Z7984 Long term (current) use of oral hypoglycemic drugs: Secondary | ICD-10-CM

## 2021-03-19 DIAGNOSIS — E860 Dehydration: Secondary | ICD-10-CM | POA: Diagnosis present

## 2021-03-19 DIAGNOSIS — Z888 Allergy status to other drugs, medicaments and biological substances status: Secondary | ICD-10-CM

## 2021-03-19 DIAGNOSIS — Z885 Allergy status to narcotic agent status: Secondary | ICD-10-CM | POA: Diagnosis not present

## 2021-03-19 DIAGNOSIS — F17210 Nicotine dependence, cigarettes, uncomplicated: Secondary | ICD-10-CM | POA: Diagnosis present

## 2021-03-19 DIAGNOSIS — E871 Hypo-osmolality and hyponatremia: Secondary | ICD-10-CM | POA: Diagnosis present

## 2021-03-19 DIAGNOSIS — E1010 Type 1 diabetes mellitus with ketoacidosis without coma: Secondary | ICD-10-CM

## 2021-03-19 DIAGNOSIS — I10 Essential (primary) hypertension: Secondary | ICD-10-CM | POA: Diagnosis present

## 2021-03-19 DIAGNOSIS — R739 Hyperglycemia, unspecified: Secondary | ICD-10-CM | POA: Insufficient documentation

## 2021-03-19 DIAGNOSIS — R0609 Other forms of dyspnea: Secondary | ICD-10-CM | POA: Diagnosis not present

## 2021-03-19 DIAGNOSIS — Z8249 Family history of ischemic heart disease and other diseases of the circulatory system: Secondary | ICD-10-CM | POA: Diagnosis not present

## 2021-03-19 HISTORY — DX: Type 2 diabetes mellitus with ketoacidosis without coma: E11.10

## 2021-03-19 LAB — URINALYSIS, ROUTINE W REFLEX MICROSCOPIC
Bacteria, UA: NONE SEEN
Bilirubin Urine: NEGATIVE
Glucose, UA: >=500 mg/dL — AB
Ketones, ur: 80 mg/dL — AB
Leukocytes,Ua: NEGATIVE
Nitrite: NEGATIVE
Protein, ur: 30 mg/dL — AB
Specific Gravity, Urine: 1.018 (ref 1.005–1.030)
pH: 5 (ref 5.0–8.0)

## 2021-03-19 LAB — CBC WITH DIFFERENTIAL/PLATELET
Abs Immature Granulocytes: 0.08 10*3/uL — ABNORMAL HIGH (ref 0.00–0.07)
Basophils Absolute: 0.1 10*3/uL (ref 0.0–0.1)
Basophils Relative: 1 %
Eosinophils Absolute: 0 10*3/uL (ref 0.0–0.5)
Eosinophils Relative: 0 %
HCT: 40.3 % (ref 36.0–46.0)
Hemoglobin: 13.2 g/dL (ref 12.0–15.0)
Immature Granulocytes: 1 %
Lymphocytes Relative: 23 %
Lymphs Abs: 2.5 10*3/uL (ref 0.7–4.0)
MCH: 31.8 pg (ref 26.0–34.0)
MCHC: 32.8 g/dL (ref 30.0–36.0)
MCV: 97.1 fL (ref 80.0–100.0)
Monocytes Absolute: 1 10*3/uL (ref 0.1–1.0)
Monocytes Relative: 9 %
Neutro Abs: 7.2 10*3/uL (ref 1.7–7.7)
Neutrophils Relative %: 66 %
Platelets: 328 10*3/uL (ref 150–400)
RBC: 4.15 MIL/uL (ref 3.87–5.11)
RDW: 12.6 % (ref 11.5–15.5)
WBC: 10.9 10*3/uL — ABNORMAL HIGH (ref 4.0–10.5)
nRBC: 0 % (ref 0.0–0.2)

## 2021-03-19 LAB — I-STAT VENOUS BLOOD GAS, ED
Acid-base deficit: 18 mmol/L — ABNORMAL HIGH (ref 0.0–2.0)
Bicarbonate: 9.8 mmol/L — ABNORMAL LOW (ref 20.0–28.0)
Calcium, Ion: 1.19 mmol/L (ref 1.15–1.40)
HCT: 42 % (ref 36.0–46.0)
Hemoglobin: 14.3 g/dL (ref 12.0–15.0)
O2 Saturation: 99 %
Potassium: 3.9 mmol/L (ref 3.5–5.1)
Sodium: 135 mmol/L (ref 135–145)
TCO2: 11 mmol/L — ABNORMAL LOW (ref 22–32)
pCO2, Ven: 27.7 mmHg — ABNORMAL LOW (ref 44.0–60.0)
pH, Ven: 7.155 — CL (ref 7.250–7.430)
pO2, Ven: 187 mmHg — ABNORMAL HIGH (ref 32.0–45.0)

## 2021-03-19 LAB — BASIC METABOLIC PANEL
Anion gap: 17 — ABNORMAL HIGH (ref 5–15)
Anion gap: 13 (ref 5–15)
BUN: 11 mg/dL (ref 6–20)
BUN: 8 mg/dL (ref 6–20)
CO2: 9 mmol/L — ABNORMAL LOW (ref 22–32)
CO2: 11 mmol/L — ABNORMAL LOW (ref 22–32)
Calcium: 9 mg/dL (ref 8.9–10.3)
Calcium: 8.2 mg/dL — ABNORMAL LOW (ref 8.9–10.3)
Chloride: 105 mmol/L (ref 98–111)
Chloride: 110 mmol/L (ref 98–111)
Creatinine, Ser: 1.21 mg/dL — ABNORMAL HIGH (ref 0.44–1.00)
Creatinine, Ser: 1.02 mg/dL — ABNORMAL HIGH (ref 0.44–1.00)
GFR, Estimated: 56 mL/min — ABNORMAL LOW (ref >60)
GFR, Estimated: >60 mL/min (ref >60)
Glucose, Bld: 331 mg/dL — ABNORMAL HIGH (ref 70–99)
Glucose, Bld: 134 mg/dL — ABNORMAL HIGH (ref 70–99)
Potassium: 4 mmol/L (ref 3.5–5.1)
Potassium: 3.6 mmol/L (ref 3.5–5.1)
Sodium: 131 mmol/L — ABNORMAL LOW (ref 135–145)
Sodium: 134 mmol/L — ABNORMAL LOW (ref 135–145)

## 2021-03-19 LAB — CBG MONITORING, ED
Glucose-Capillary: 331 mg/dL — ABNORMAL HIGH (ref 70–99)
Glucose-Capillary: 322 mg/dL — ABNORMAL HIGH (ref 70–99)
Glucose-Capillary: 243 mg/dL — ABNORMAL HIGH (ref 70–99)
Glucose-Capillary: 151 mg/dL — ABNORMAL HIGH (ref 70–99)
Glucose-Capillary: 129 mg/dL — ABNORMAL HIGH (ref 70–99)
Glucose-Capillary: 168 mg/dL — ABNORMAL HIGH (ref 70–99)
Glucose-Capillary: 192 mg/dL — ABNORMAL HIGH (ref 70–99)

## 2021-03-19 LAB — LIPID PANEL
Cholesterol: 241 mg/dL — ABNORMAL HIGH (ref 0–200)
HDL: 24 mg/dL — ABNORMAL LOW (ref >40)
LDL Cholesterol: 179 mg/dL — ABNORMAL HIGH (ref 0–99)
Total CHOL/HDL Ratio: 10 RATIO
Triglycerides: 190 mg/dL — ABNORMAL HIGH (ref <150)
VLDL: 38 mg/dL (ref 0–40)

## 2021-03-19 LAB — BETA-HYDROXYBUTYRIC ACID: Beta-Hydroxybutyric Acid: 6.96 mmol/L — ABNORMAL HIGH (ref 0.05–0.27)

## 2021-03-19 LAB — RESP PANEL BY RT-PCR (FLU A&B, COVID) ARPGX2
Influenza A by PCR: NEGATIVE
Influenza B by PCR: NEGATIVE
SARS Coronavirus 2 by RT PCR: NEGATIVE

## 2021-03-19 LAB — I-STAT BETA HCG BLOOD, ED (MC, WL, AP ONLY): I-stat hCG, quantitative: <5 m[IU]/mL (ref <5)

## 2021-03-19 LAB — GLUCOSE, CAPILLARY: Glucose-Capillary: 270 mg/dL — ABNORMAL HIGH (ref 70–99)

## 2021-03-19 LAB — HIV ANTIBODY (ROUTINE TESTING W REFLEX): HIV Screen 4th Generation wRfx: NONREACTIVE

## 2021-03-19 MED ORDER — LACTATED RINGERS IV SOLN: INTRAVENOUS | Status: DC

## 2021-03-19 MED ORDER — METHOCARBAMOL 500 MG PO TABS: 500.0000 mg | ORAL_TABLET | Freq: Three times a day (TID) | ORAL | Status: DC | PRN

## 2021-03-19 MED ORDER — LACTATED RINGERS IV BOLUS
1000.0000 mL | INTRAVENOUS | Status: AC
  Administered 2021-03-19: 1000 mL via INTRAVENOUS

## 2021-03-19 MED ORDER — ASPIRIN-ACETAMINOPHEN-CAFFEINE 250-250-65 MG PO TABS
1.0000 | ORAL_TABLET | Freq: Four times a day (QID) | ORAL | Status: DC | PRN
  Filled 2021-03-19: qty 1

## 2021-03-19 MED ORDER — DEXTROSE 50 % IV SOLN: 0.0000 mL | INTRAVENOUS | Status: DC | PRN

## 2021-03-19 MED ORDER — LIVING WELL WITH DIABETES BOOK
Freq: Once | Status: DC
  Filled 2021-03-19 (×3): qty 1

## 2021-03-19 MED ORDER — ENOXAPARIN SODIUM 60 MG/0.6ML IJ SOSY
50.0000 mg | PREFILLED_SYRINGE | INTRAMUSCULAR | Status: DC
  Administered 2021-03-19: 50 mg via SUBCUTANEOUS
  Filled 2021-03-19: qty 0.5
  Filled 2021-03-19: qty 0.6

## 2021-03-19 MED ORDER — IRBESARTAN 75 MG PO TABS
150.0000 mg | ORAL_TABLET | Freq: Every day | ORAL | Status: DC
  Administered 2021-03-19 – 2021-03-21 (×3): 150 mg via ORAL
  Filled 2021-03-19: qty 2
  Filled 2021-03-19: qty 1
  Filled 2021-03-19: qty 2

## 2021-03-19 MED ORDER — INSULIN ASPART 100 UNIT/ML IJ SOLN
0.0000 [IU] | Freq: Three times a day (TID) | INTRAMUSCULAR | Status: DC
  Administered 2021-03-19: 3 [IU] via SUBCUTANEOUS
  Administered 2021-03-20: 8 [IU] via SUBCUTANEOUS
  Administered 2021-03-20: 11 [IU] via SUBCUTANEOUS
  Administered 2021-03-20: 8 [IU] via SUBCUTANEOUS
  Administered 2021-03-21: 15 [IU] via SUBCUTANEOUS

## 2021-03-19 MED ORDER — FLUOXETINE HCL 20 MG PO CAPS
40.0000 mg | ORAL_CAPSULE | Freq: Every day | ORAL | Status: DC
  Administered 2021-03-19 – 2021-03-21 (×3): 40 mg via ORAL
  Filled 2021-03-19 (×3): qty 2

## 2021-03-19 MED ORDER — ONDANSETRON HCL 4 MG/2ML IJ SOLN: 4.0000 mg | Freq: Four times a day (QID) | INTRAMUSCULAR | Status: DC | PRN

## 2021-03-19 MED ORDER — DEXTROSE IN LACTATED RINGERS 5 % IV SOLN: INTRAVENOUS | Status: DC

## 2021-03-19 MED ORDER — ALBUTEROL SULFATE (2.5 MG/3ML) 0.083% IN NEBU: 2.5000 mg | INHALATION_SOLUTION | Freq: Four times a day (QID) | RESPIRATORY_TRACT | Status: DC | PRN

## 2021-03-19 MED ORDER — POTASSIUM CHLORIDE 10 MEQ/100ML IV SOLN
10.0000 meq | INTRAVENOUS | Status: AC
  Administered 2021-03-19 (×2): 10 meq via INTRAVENOUS
  Filled 2021-03-19 (×2): qty 100

## 2021-03-19 MED ORDER — ACETAMINOPHEN 500 MG PO TABS: 500.0000 mg | ORAL_TABLET | Freq: Four times a day (QID) | ORAL | Status: DC | PRN

## 2021-03-19 MED ORDER — LACTATED RINGERS IV BOLUS
20.0000 mL/kg | Freq: Once | INTRAVENOUS | Status: AC
  Administered 2021-03-19: 2024 mL via INTRAVENOUS

## 2021-03-19 MED ORDER — ONDANSETRON HCL 4 MG/2ML IJ SOLN
4.0000 mg | Freq: Once | INTRAMUSCULAR | Status: DC
  Filled 2021-03-19: qty 2

## 2021-03-19 MED ORDER — INSULIN REGULAR(HUMAN) IN NACL 100-0.9 UT/100ML-% IV SOLN
INTRAVENOUS | Status: DC
  Administered 2021-03-19: 13 [IU]/h via INTRAVENOUS
  Filled 2021-03-19: qty 100

## 2021-03-19 MED ORDER — HYDROCHLOROTHIAZIDE 25 MG PO TABS
25.0000 mg | ORAL_TABLET | Freq: Every day | ORAL | Status: DC
  Administered 2021-03-20 – 2021-03-21 (×2): 25 mg via ORAL
  Filled 2021-03-19 (×2): qty 1

## 2021-03-19 MED ORDER — INSULIN STARTER KIT- PEN NEEDLES (ENGLISH)
1.0000 | Freq: Once | Status: DC
  Filled 2021-03-19: qty 1

## 2021-03-19 NOTE — ED Notes (Signed)
ED TO INPATIENT HANDOFF REPORT  ED Nurse Name and Phone #: Baxter Flattery, RN [phone]  S Name/Age/Gender Tracy Johns 45 y.o. female Room/Bed: 014C/014C  Code Status   Code Status: Full Code  Home/SNF/Other Home Patient oriented to: self, place, time, and situation Is this baseline? Yes   Triage Complete: Triage complete  Chief Complaint DKA (diabetic ketoacidosis) ([place]) [E11.10]  Triage Note [facility] reports pt coming from home c/o her sugar being high. Been over 500, today it was 330. Pt states she has not felt well and having pain in her back and right leg. Pt denies any n/v today. Did have some on Friday.   Allergies Allergies  Allergen Reactions   Imitrex [Sumatriptan] Nausea And Vomiting   Oxycodone Itching    Percocet "feel extremely itching and skin burns"   Percocet [Oxycodone-Acetaminophen]     itching    Level of Care/Admitting Diagnosis ED Disposition     ED Disposition  Admit   Condition  --   [place]: [place] [[id number]]  Level of Care: Med-Surg [16]  May admit patient to [facility] or [facility] if equivalent level of care is available:: No  Covid Evaluation: Confirmed COVID Negative  Diagnosis: DKA (diabetic ketoacidosis) [facility]) [[id number]]  Admitting Physician: Lequita Halt [[id number]]  Attending Physician: Lequita Halt [[id number]]  Estimated length of stay: past midnight tomorrow  Certification:: I certify this patient will need inpatient services for at least 2 midnights          B Medical/Surgery History Past Medical History:  Diagnosis Date   Dyspnea on exertion    History of acute pyelonephritis 06/26/2017   w/ sepsis   History of kidney stones    per pt 2008 and passed spontenaously   History of pseudoseizure    07-07-2017 per had first episode seizure activity that told to her caused by extreme stress in 2012, 2015 and last one 2017;  per pt was told no medication needed   Hypercholesterolemia     Hyperprolactinemia [facility]) dx 2012   endocrinologist-  dr Cruzita Lederer   Hypertension    Migraines    Pituitary microadenoma [facility])    dx per MRI 06/ 2018   Wears glasses    Past Surgical History:  Procedure Laterality Date   CESAREAN SECTION  x2  last one 2000   Bilateral Tubal Ligation w/ last c/s   CYSTOSCOPY/RETROGRADE/URETEROSCOPY Right 06/26/2017   Procedure: CYSTOSCOPY RIGHT RETROGRADE RIGHT CYSTOSCOPY;  Surgeon: Festus Aloe, MD;  Location: [facility];  Service: Urology;  Laterality: Right;   CYSTOSCOPY/URETEROSCOPY/HOLMIUM LASER/STENT PLACEMENT Right 07/10/2017   Procedure: CYSTOSCOPY/URETEROSCOPY/HOLMIUM LASER/STENT EXCHANGE;  Surgeon: Festus Aloe, MD;  Location: [facility];  Service: Urology;  Laterality: Right;     A IV Location/Drains/Wounds Patient Lines/Drains/Airways Status     Active Line/Drains/Airways     Name Placement date Placement time Site Days   Peripheral IV 03/19/21 20 G 1" Right Antecubital 03/19/21  1015  Antecubital  less than 1   Peripheral IV 03/19/21 20 G 1" Posterior;Right Hand 03/19/21  --  Hand  less than 1   Ureteral Drain/Stent Right ureter 6 Fr. 07/10/17  1157  Right ureter  1348   Incision (Closed) 07/10/17 Perineum 07/10/17  1217  -- 1348            Intake/Output Last 24 hours  Intake/Output Summary (Last 24 hours) at 03/19/2021 1743 Last data filed at 03/19/2021 1617 Gross per 24 hour  Intake 3865.6  ml  Output --  Net 3865.6 ml    Labs/Imaging Results for orders placed or performed during the hospital encounter of 03/19/21 (from the past 48 hour(s))  CBG monitoring, ED     Status: Abnormal   Collection Time: 03/19/21  9:44 AM  Result Value Ref Range   Glucose-Capillary 331 (H) 70 - 99 mg/dL    Comment: Glucose reference range applies only to samples taken after fasting for at least 8 hours.  Basic metabolic panel     Status: Abnormal   Collection Time: 03/19/21 10:04 AM  Result Value Ref Range   Sodium 131 (L)  135 - 145 mmol/L   Potassium 4.0 3.5 - 5.1 mmol/L   Chloride 105 98 - 111 mmol/L   CO2 9 (L) 22 - 32 mmol/L   Glucose, Bld 331 (H) 70 - 99 mg/dL    Comment: Glucose reference range applies only to samples taken after fasting for at least 8 hours.   BUN 11 6 - 20 mg/dL   Creatinine, Ser 1.21 (H) 0.44 - 1.00 mg/dL   Calcium 9.0 8.9 - 10.3 mg/dL   GFR, Estimated 56 (L) >60 mL/min    Comment: (NOTE) Calculated using the CKD-EPI Creatinine Equation (2021)    Anion gap 17 (H) 5 - 15    Comment: Performed at [facility] [address], [place], [place] [address]  Beta-hydroxybutyric acid     Status: Abnormal   Collection Time: 03/19/21 10:04 AM  Result Value Ref Range   Beta-Hydroxybutyric Acid 6.96 (H) 0.05 - 0.27 mmol/L    Comment: RESULTS CONFIRMED BY MANUAL DILUTION Performed at [facility] [address] [place], [place] [address]   CBC with Differential (PNL)     Status: Abnormal   Collection Time: 03/19/21 10:04 AM  Result Value Ref Range   WBC 10.9 (H) 4.0 - 10.5 K/uL   RBC 4.15 3.87 - 5.11 MIL/uL   Hemoglobin 13.2 12.0 - 15.0 g/dL   HCT 40.3 36.0 - 46.0 %   MCV 97.1 80.0 - 100.0 fL   MCH 31.8 26.0 - 34.0 pg   MCHC 32.8 30.0 - 36.0 g/dL   RDW 12.6 11.5 - 15.5 %   Platelets 328 150 - 400 K/uL   nRBC 0.0 0.0 - 0.2 %   Neutrophils Relative % 66 %   Neutro Abs 7.2 1.7 - 7.7 K/uL   Lymphocytes Relative 23 %   Lymphs Abs 2.5 0.7 - 4.0 K/uL   Monocytes Relative 9 %   Monocytes Absolute 1.0 0.1 - 1.0 K/uL   Eosinophils Relative 0 %   Eosinophils Absolute 0.0 0.0 - 0.5 K/uL   Basophils Relative 1 %   Basophils Absolute 0.1 0.0 - 0.1 K/uL   Immature Granulocytes 1 %   Abs Immature Granulocytes 0.08 (H) 0.00 - 0.07 K/uL    Comment: Performed at [facility] [address] [place], [place] [address]  Resp Panel by RT-PCR (Flu A&B, Covid) Nasopharyngeal Swab     Status: None   Collection Time: 03/19/21 10:08 AM   Specimen: Nasopharyngeal Swab;  Nasopharyngeal(NP) swabs in vial transport medium  Result Value Ref Range   SARS Coronavirus 2 by RT PCR NEGATIVE NEGATIVE    Comment: (NOTE) SARS-CoV-2 target nucleic acids are NOT DETECTED.  The SARS-CoV-2 RNA is generally detectable in upper respiratory specimens during the acute phase of infection. The lowest concentration of SARS-CoV-2 viral copies this assay can detect is 138 copies/mL.  A negative result does not preclude SARS-Cov-2 infection and should not be used as the sole basis for treatment or other patient management decisions. A negative result may occur with  improper specimen collection/handling, submission of specimen other than nasopharyngeal swab, presence of viral mutation(s) within the areas targeted by this assay, and inadequate number of viral copies(<138 copies/mL). A negative result must be combined with clinical observations, patient history, and epidemiological information. The expected result is Negative.  Fact Sheet for Patients:  EntrepreneurPulse.com.au  Fact Sheet for Healthcare Providers:  IncredibleEmployment.be  This test is no t yet approved or cleared by the [place] FDA and  has been authorized for detection and/or diagnosis of SARS-CoV-2 by FDA under an Emergency Use Authorization (EUA). This EUA will remain  in effect (meaning this test can be used) for the duration of the COVID-19 declaration under Section 564(b)(1) of the Act, 21 U.S.C.section 360bbb-3(b)(1), unless the authorization is terminated  or revoked sooner.       Influenza A by PCR NEGATIVE NEGATIVE   Influenza B by PCR NEGATIVE NEGATIVE    Comment: (NOTE) The Xpert Xpress SARS-CoV-2/FLU/RSV plus assay is intended as an aid in the diagnosis of influenza from Nasopharyngeal swab specimens and should not be used as a sole basis for treatment. Nasal washings and aspirates are unacceptable for Xpert Xpress  SARS-CoV-2/FLU/RSV testing.  Fact Sheet for Patients: EntrepreneurPulse.com.au  Fact Sheet for Healthcare Providers: IncredibleEmployment.be  This test is not yet approved or cleared by the [place] FDA and has been authorized for detection and/or diagnosis of SARS-CoV-2 by FDA under an Emergency Use Authorization (EUA). This EUA will remain in effect (meaning this test can be used) for the duration of the COVID-19 declaration under Section 564(b)(1) of the Act, 21 U.S.C. section 360bbb-3(b)(1), unless the authorization is terminated or revoked.  Performed at [facility], [place] [address] [place], [place] [address]   I-Stat Venous Blood Gas, ED     Status: Abnormal   Collection Time: 03/19/21 10:25 AM  Result Value Ref Range   pH, Ven 7.155 (LL) 7.250 - 7.430   pCO2, Ven 27.7 (L) 44.0 - 60.0 mmHg   pO2, Ven 187.0 (H) 32.0 - 45.0 mmHg   Bicarbonate 9.8 (L) 20.0 - 28.0 mmol/L   TCO2 11 (L) 22 - 32 mmol/L   O2 Saturation 99.0 %   Acid-base deficit 18.0 (H) 0.0 - 2.0 mmol/L   Sodium 135 135 - 145 mmol/L   Potassium 3.9 3.5 - 5.1 mmol/L   Calcium, Ion 1.19 1.15 - 1.40 mmol/L   HCT 42.0 36.0 - 46.0 %   Hemoglobin 14.3 12.0 - 15.0 g/dL   Sample type VENOUS    Comment NOTIFIED PHYSICIAN   I-Stat beta hCG blood, ED     Status: None   Collection Time: 03/19/21 10:26 AM  Result Value Ref Range   I-stat hCG, quantitative <5.0 <5 mIU/mL   Comment 3            Comment:   GEST. AGE      CONC.  (mIU/mL)   <=1 WEEK        5 - 50     2 WEEKS       50 - 500     3 WEEKS       100 - 10,000     4 WEEKS     1,000 - 30,000        FEMALE AND NON-PREGNANT FEMALE:  LESS THAN 5 mIU/mL   Urinalysis, Routine w reflex microscopic Urine, Clean Catch     Status: Abnormal   Collection Time: 03/19/21 10:37 AM  Result Value Ref Range   Color, Urine YELLOW YELLOW   APPearance HAZY (A) CLEAR   Specific Gravity, Urine 1.018 1.005 - 1.030   pH 5.0 5.0 -  8.0   Glucose, UA >=500 (A) NEGATIVE mg/dL   Hgb urine dipstick MODERATE (A) NEGATIVE   Bilirubin Urine NEGATIVE NEGATIVE   Ketones, ur 80 (A) NEGATIVE mg/dL   Protein, ur 30 (A) NEGATIVE mg/dL   Nitrite NEGATIVE NEGATIVE   Leukocytes,Ua NEGATIVE NEGATIVE   RBC / HPF 0-5 0 - 5 RBC/hpf   WBC, UA 0-5 0 - 5 WBC/hpf   Bacteria, UA NONE SEEN NONE SEEN   Squamous Epithelial / LPF 0-5 0 - 5   Mucus PRESENT    Granular Casts, UA PRESENT     Comment: Performed at [facility], [place] [address] [place], [place] [address]  CBG monitoring, ED     Status: Abnormal   Collection Time: 03/19/21 10:40 AM  Result Value Ref Range   Glucose-Capillary 322 (H) 70 - 99 mg/dL    Comment: Glucose reference range applies only to samples taken after fasting for at least 8 hours.  CBG monitoring, ED     Status: Abnormal   Collection Time: 03/19/21 11:42 AM  Result Value Ref Range   Glucose-Capillary 243 (H) 70 - 99 mg/dL    Comment: Glucose reference range applies only to samples taken after fasting for at least 8 hours.  CBG monitoring, ED     Status: Abnormal   Collection Time: 03/19/21  1:08 PM  Result Value Ref Range   Glucose-Capillary 151 (H) 70 - 99 mg/dL    Comment: Glucose reference range applies only to samples taken after fasting for at least 8 hours.  Basic metabolic panel     Status: Abnormal   Collection Time: 03/19/21  2:04 PM  Result Value Ref Range   Sodium 134 (L) 135 - 145 mmol/L   Potassium 3.6 3.5 - 5.1 mmol/L   Chloride 110 98 - 111 mmol/L   CO2 11 (L) 22 - 32 mmol/L   Glucose, Bld 134 (H) 70 - 99 mg/dL    Comment: Glucose reference range applies only to samples taken after fasting for at least 8 hours.   BUN 8 6 - 20 mg/dL   Creatinine, Ser 1.02 (H) 0.44 - 1.00 mg/dL   Calcium 8.2 (L) 8.9 - 10.3 mg/dL   GFR, Estimated >60 >60 mL/min    Comment: (NOTE) Calculated using the CKD-EPI Creatinine Equation (2021)    Anion gap 13 5 - 15    Comment: Performed at [facility] [address], [place], [place] [address]  Lipid panel     Status: Abnormal   Collection Time: 03/19/21  2:04 PM  Result Value Ref Range   Cholesterol 241 (H) 0 - 200 mg/dL   Triglycerides 190 (H) <150 mg/dL   HDL 24 (L) >40 mg/dL   Total CHOL/HDL Ratio 10.0 RATIO   VLDL 38 0 - 40 mg/dL   LDL Cholesterol 179 (H) 0 - 99 mg/dL    Comment:        Total Cholesterol/HDL:CHD Risk Coronary Heart Disease Risk Table                     Men   Women  1/2  Average Risk   3.4   3.3  Average Risk       5.0   4.4  2 X Average Risk   9.6   7.1  3 X Average Risk  23.4   11.0        Use the calculated Patient Ratio above and the CHD Risk Table to determine the patient's CHD Risk.        ATP III CLASSIFICATION (LDL):  <100     mg/dL   Optimal  100-129  mg/dL   Near or Above                    Optimal  130-159  mg/dL   Borderline  160-189  mg/dL   High  >190     mg/dL   Very High Performed at [facility] [address], [place], [place] [address]   CBG monitoring, ED     Status: Abnormal   Collection Time: 03/19/21  2:07 PM  Result Value Ref Range   Glucose-Capillary 129 (H) 70 - 99 mg/dL    Comment: Glucose reference range applies only to samples taken after fasting for at least 8 hours.  CBG monitoring, ED     Status: Abnormal   Collection Time: 03/19/21  3:16 PM  Result Value Ref Range   Glucose-Capillary 168 (H) 70 - 99 mg/dL    Comment: Glucose reference range applies only to samples taken after fasting for at least 8 hours.   No results found.  Pending Labs Unresulted Labs (From admission, onward)     Start     Ordered   03/20/21 8099  Basic metabolic panel  Tomorrow morning,   R        03/19/21 1612   03/19/21 1218  HIV Antibody (routine testing w rflx)  (HIV Antibody (Routine testing w reflex) panel)  Once,   R        03/19/21 1218   03/19/21 1216  Proinsulin/insulin ratio  Once,   STAT        03/19/21 1215            Vitals/Pain Today's Vitals    03/19/21 0936 03/19/21 0945 03/19/21 1133 03/19/21 1400  BP:  (!) 142/96 128/82 106/69  Pulse:  (!) 105 (!) 102 93  Resp:  _0 Temp:      TempSrc:      SpO2:  97% 100% 98%  Weight: 223 lb (101.2 kg)     Height: _1  (1.575 m)     PainSc:        Isolation Precautions No active isolations  Medications Medications  dextrose 50 % solution 0-50 mL (has no administration in time range)  ondansetron (ZOFRAN) injection 4 mg (0 mg Intravenous Hold 03/19/21 1234)  acetaminophen (TYLENOL) tablet 500 mg (has no administration in time range)  aspirin-acetaminophen-caffeine (EXCEDRIN MIGRAINE) per tablet 1 tablet (has no administration in time range)  hydrochlorothiazide (HYDRODIURIL) tablet 25 mg (has no administration in time range)  irbesartan (AVAPRO) tablet 150 mg (150 mg Oral Given 03/19/21 1628)  FLUoxetine (PROZAC) capsule 40 mg (40 mg Oral Given 03/19/21 1235)  methocarbamol (ROBAXIN) tablet 500 mg (has no administration in time range)  albuterol (PROVENTIL) (2.5 MG/3ML) 0.083% nebulizer solution 2.5 mg (has no administration in time range)  enoxaparin (LOVENOX) injection 50 mg (50 mg Subcutaneous Given 03/19/21 1626)  ondansetron (ZOFRAN) injection 4 mg (has no administration in time range)  living well with diabetes  book MISC (0 each Does not apply Hold 03/19/21 1629)  insulin starter kit- pen needles (English) 1 kit (has no administration in time range)  insulin aspart (novoLOG) injection 0-15 Units (has no administration in time range)  lactated ringers bolus 1,000 mL (0 mLs Intravenous Stopped 03/19/21 1150)  potassium chloride 10 mEq in 100 mL IVPB (0 mEq Intravenous Stopped 03/19/21 1317)  lactated ringers bolus 2,024 mL (0 mLs Intravenous Stopped 03/19/21 1552)    Mobility walks Low fall risk   Focused Assessments Cardiac Assessment Handoff:    No results found for: CKTOTAL, CKMB, CKMBINDEX, TROPONINI No results found for: DDIMER Does the Patient currently have chest  pain? No    R Recommendations: See Admitting Provider Note  Report given to:   Additional Notes:

## 2021-03-19 NOTE — H&P (Signed)
History and Physical    Tracy Johns ALP:[id number] DOB: 11-04-1975 DOA: 03/19/2021  PCP: Libby Maw, MD (Confirm with patient/family/NH records and if not entered, this has to be entered at [facility] point of entry) Patient coming from: Home  I have personally briefly reviewed patient's old medical records in [place]  Chief Complaint: Nauseous vomiting, being tired.  HPI: Tracy Johns is a 45 y.o. female with medical history significant of HTN, HLD, migraines, presented with generalized weakness, nauseous vomiting.  Symptoms started about 2 weeks ago, developed symptoms of polydipsia, polyuria.  She occasionally check her sugar using her husband fingerstick strips, most occasions 300-500.  Yesterday, she started to develop nausea and frequent vomiting of stomach content nonbloody nonbilious.  No abdominal pain.  She came to the ED, fingerstick> 500.  Blood work showed picture of DKA, ED started patient on insulin drip.  Denies any cough, no fever or chills, no dysuria, no abdominal pain.  ED level, glucose 331, bicarb 9, anion gap 17, VBG 7.1 5/27/187  Review of Systems: As per HPI otherwise 14 point review of systems negative.    Past Medical History:  Diagnosis Date   Dyspnea on exertion    History of acute pyelonephritis 06/26/2017   w/ sepsis   History of kidney stones    per pt 2008 and passed spontenaously   History of pseudoseizure    07-07-2017 per had first episode seizure activity that told to her caused by extreme stress in 2012, 2015 and last one 2017;  per pt was told no medication needed   Hypercholesterolemia    Hyperprolactinemia [facility]) dx 2012   endocrinologist-  dr Cruzita Lederer   Hypertension    Migraines    Pituitary microadenoma [facility])    dx per MRI 06/ 2018   Wears glasses     Past Surgical History:  Procedure Laterality Date   CESAREAN SECTION  x2  last one 2000   Bilateral Tubal Ligation w/ last c/s    CYSTOSCOPY/RETROGRADE/URETEROSCOPY Right 06/26/2017   Procedure: CYSTOSCOPY RIGHT RETROGRADE RIGHT CYSTOSCOPY;  Surgeon: Festus Aloe, MD;  Location[facility];  Service: Urology;  Laterality: Right;   CYSTOSCOPY/URETEROSCOPY/HOLMIUM LASER/STENT PLACEMENT Right 07/10/2017   Procedure: CYSTOSCOPY/URETEROSCOPY/HOLMIUM LASER/STENT EXCHANGE;  Surgeon: Festus Aloe, MD;  Location[facility];  Service: Urology;  Laterality: Right;     reports that she has been smoking cigarettes. She has a 3.00 pack-year smoking history. She has never used smokeless tobacco. She reports current alcohol use. She reports that she does not use drugs.  Allergies  Allergen Reactions   Imitrex [Sumatriptan] Nausea And Vomiting   Oxycodone Itching    Percocet "feel extremely itching and skin burns"   Percocet [Oxycodone-Acetaminophen]     itching    Family History  Problem Relation Age of Onset   Hypertension Other    Stroke Other    Heart failure Other    Heart attack Mother    Stroke Father      Prior to Admission medications   Medication Sig Start Date End Date Taking? Authorizing Provider  methocarbamol (ROBAXIN) 500 MG tablet Take 1 tablet (500 mg total) by mouth every 8 (eight) hours as needed for muscle spasms. 02/07/21   Libby Maw, MD  acetaminophen (TYLENOL) 500 MG tablet Take 500 mg by mouth every 6 (six) hours as needed.    [provider]  albuterol (VENTOLIN HFA) 108 (90 Base) MCG/ACT inhaler INHALE 1 TO 2 PUFFS INTO THE LUNGS EVERY 6  HOURS AS NEEDED FOR WHEEZING OR SHORTNESS OF BREATH 10/11/19   Libby Maw, MD  aspirin-acetaminophen-caffeine [facility] MIGRAINE) [phone] MG tablet Take by mouth every 6 (six) hours as needed for headache.    [provider]  FLUoxetine (PROZAC) 40 MG capsule Take 1 capsule (40 mg total) by mouth daily. 07/26/20   Libby Maw, MD  hydrochlorothiazide (HYDRODIURIL) 25 MG tablet Take 1 tablet (25  mg total) by mouth daily. 07/26/20   Libby Maw, MD  metFORMIN (GLUCOPHAGE) 500 MG tablet Take 1 tablet (500 mg total) by mouth 2 (two) times daily with a meal. 03/18/21 04/17/21  Chase Picket, MD  telmisartan (MICARDIS) 40 MG tablet TAKE 1 TABLET(40 MG) BY MOUTH DAILY 12/03/20   Libby Maw, MD    Physical Exam: Vitals:   03/19/21 0934 03/19/21 0936 03/19/21 0945 03/19/21 1133  BP: (!) 141/86  (!) 142/96 128/82  Pulse: (!) 109  (!) 105 (!) 102  Resp: 18  20 16   Temp: 98.8 F (37.1 C)     TempSrc: Oral     SpO2: 98%  97% 100%  Weight:  101.2 kg    Height:  5\' 2"  (1.575 m)      Constitutional: NAD, calm, comfortable Vitals:   03/19/21 0934 03/19/21 0936 03/19/21 0945 03/19/21 1133  BP: (!) 141/86  (!) 142/96 128/82  Pulse: (!) 109  (!) 105 (!) 102  Resp: 18  20 16   Temp: 98.8 F (37.1 C)     TempSrc: Oral     SpO2: 98%  97% 100%  Weight:  101.2 kg    Height:  5\' 2"  (1.575 m)     Eyes: PERRL, lids and conjunctivae normal ENMT: Mucous membranes are dry. Posterior pharynx clear of any exudate or lesions.Normal dentition.  Neck: normal, supple, no masses, no thyromegaly Respiratory: clear to auscultation bilaterally, no wheezing, no crackles. Normal respiratory effort. No accessory muscle use.  Cardiovascular: Regular rate and rhythm, no murmurs / rubs / gallops. No extremity edema. 2+ pedal pulses. No carotid bruits.  Abdomen: no tenderness, no masses palpated. No hepatosplenomegaly. Bowel sounds positive.  Musculoskeletal: no clubbing / cyanosis. No joint deformity upper and lower extremities. Good ROM, no contractures. Normal muscle tone.  Skin: no rashes, lesions, ulcers. No induration Neurologic: CN 2-12 grossly intact. Sensation intact, DTR normal. Strength 5/5 in all 4.  Psychiatric: Normal judgment and insight. Alert and oriented x 3. Normal mood.     Labs on Admission: I have personally reviewed following labs and imaging  studies  CBC: Recent Labs  Lab 03/18/21 1219 03/19/21 1004 03/19/21 1025  WBC 11.7* 10.9*  --   NEUTROABS 8.3* 7.2  --   HGB 14.0 13.2 14.3  HCT 43.4 40.3 42.0  MCV 96.7 97.1  --   PLT 390 328  --    Basic Metabolic Panel: Recent Labs  Lab 03/18/21 1219 03/19/21 1004 03/19/21 1025  NA 129* 131* 135  K 4.4 4.0 3.9  CL 101 105  --   CO2 9* 9*  --   GLUCOSE 403* 331*  --   BUN 13 11  --   CREATININE 1.40* 1.21*  --   CALCIUM 9.1 9.0  --    GFR: Estimated Creatinine Clearance: 64.7 mL/min (A) (by C-G formula based on SCr of 1.21 mg/dL (H)). Liver Function Tests: Recent Labs  Lab 03/18/21 1219  AST 20  ALT 31  ALKPHOS 101  BILITOT 1.7*  PROT 8.8*  ALBUMIN  4.6   No results for input(s): LIPASE, AMYLASE in the last 168 hours. No results for input(s): AMMONIA in the last 168 hours. Coagulation Profile: No results for input(s): INR, PROTIME in the last 168 hours. Cardiac Enzymes: No results for input(s): CKTOTAL, CKMB, CKMBINDEX, TROPONINI in the last 168 hours. BNP (last 3 results) No results for input(s): PROBNP in the last 8760 hours. HbA1C: Recent Labs    03/18/21 1219  HGBA1C 12.0*   CBG: Recent Labs  Lab 03/18/21 1029 03/19/21 0944 03/19/21 1040 03/19/21 1142  GLUCAP 401* 331* 322* 243*   Lipid Profile: No results for input(s): CHOL, HDL, LDLCALC, TRIG, CHOLHDL, LDLDIRECT in the last 72 hours. Thyroid Function Tests: No results for input(s): TSH, T4TOTAL, FREET4, T3FREE, THYROIDAB in the last 72 hours. Anemia Panel: No results for input(s): VITAMINB12, FOLATE, FERRITIN, TIBC, IRON, RETICCTPCT in the last 72 hours. Urine analysis:    Component Value Date/Time   COLORURINE YELLOW 03/19/2021 1037   APPEARANCEUR HAZY (A) 03/19/2021 1037   LABSPEC 1.018 03/19/2021 1037   PHURINE 5.0 03/19/2021 1037   GLUCOSEU >=500 (A) 03/19/2021 1037   HGBUR MODERATE (A) 03/19/2021 1037   BILIRUBINUR NEGATIVE 03/19/2021 1037   KETONESUR 80 (A) 03/19/2021 1037    PROTEINUR 30 (A) 03/19/2021 1037   UROBILINOGEN 0.2 02/13/2014 1308   NITRITE NEGATIVE 03/19/2021 1037   LEUKOCYTESUR NEGATIVE 03/19/2021 1037    Radiological Exams on Admission: No results found.  EKG: Independently reviewed.  Sinus tachycardia.  Assessment/Plan Active Problems:   DKA (diabetic ketoacidosis) ([place])  (please populate well all problems here in Problem List. (For example, if patient is on BP meds at home and you resume or decide to hold them, it is a problem that needs to be her. Same for CAD, COPD, HLD and so on)  DKA -Suspect adult onset of type 1 diabetes, send proinsulin/insulin level -A1c= 12, insulin indicated, likely will need insulin to go home. -Check lipid panel. -Continue home BP including ACEI. -Continue insulin drip, BMP every 4 hours. -As needed Zofran for nauseous vomiting  HTN -Continue HCTZ and ACEI.  Migraines -Stable, continue as needed Fioricet.  DVT prophylaxis: Lovenox Code Status: Full code Family Communication: None at bedside Disposition Plan: Expect 1 to 2 days hospital stay, insulin drip then bridging for subcu insulin. Consults called: None Admission status: PCU   Lequita Halt MD [facility] Pager [phone]  03/19/2021, 12:40 PM

## 2021-03-19 NOTE — ED Provider Notes (Addendum)
[place] EMERGENCY DEPARTMENT Provider Note   CSN: [id number] Arrival date & time: 03/19/21  9470     History Chief Complaint  Patient presents with   Hyperglycemia    Tracy Johns is a 45 y.o. female with history of HTN and HLD who presents to ED via EMS due to complaint of high blood sugar. Patient states that the last two weeks she has had increased thirst, urination and excessive hunger. Patient states that on Sunday her husband, who is a diabetic, measured her blood glucose using his home meter and the reading was 503. Patient states she then went to urgent care on Monday and received a BG reading of 401. Patient states the only known family history she has of diabetes is her aunt. Patient endorses increased urination, increased thirst, increased hunger, cramping in bilateral lower extremities and feeling like she cannot catch her breath. Patient also endorses nausea and vomiting on Friday, diarrhea and heat/cold intolerance. Patient denies abdominal pain, fever or chest pain.    Hyperglycemia Associated symptoms: nausea, shortness of breath and vomiting   Associated symptoms: no abdominal pain, no chest pain, no dizziness, no fever, no increased thirst and no polyuria       Past Medical History:  Diagnosis Date   Dyspnea on exertion    History of acute pyelonephritis 06/26/2017   w/ sepsis   History of kidney stones    per pt 2008 and passed spontenaously   History of pseudoseizure    07-07-2017 per had first episode seizure activity that told to her caused by extreme stress in 2012, 2015 and last one 2017;  per pt was told no medication needed   Hypercholesterolemia    Hyperprolactinemia [facility]) dx 2012   endocrinologist-  dr Cruzita Lederer   Hypertension    Migraines    Pituitary microadenoma ([place])    dx per MRI 06/ 2018   Wears glasses     Patient Active Problem List   Diagnosis Date Noted   DKA (diabetic ketoacidosis) ([place]) 03/19/2021    Hyperglycemia    Class 2 obesity due to excess calories with body mass index (BMI) of 38.0 to 38.9 in adult 03/12/2020   Elevated LDL cholesterol level 03/12/2020   Reactive airway disease 09/19/2019   Left low back pain 08/17/2019   Elevated glucose 08/17/2019   Depression with anxiety 07/04/2019   Tobacco use 07/04/2019   Abnormal thyroid function test 10/06/2017   Nephrolithiasis 06/27/2017   Hypokalemia 06/27/2017   Hyponatremia 06/27/2017   Essential hypertension 06/27/2017   Sepsis ([place]) 06/27/2017   Pyelonephritis 06/26/2017   Empty sella ([place]) 04/06/2017   Prolactinoma ([place]) 04/06/2017   High serum adrenocorticotropic hormone (ACTH) 04/06/2017   Hyperprolactinemia ([place]) 10/02/2016    Past Surgical History:  Procedure Laterality Date   CESAREAN SECTION  x2  last one 2000   Bilateral Tubal Ligation w/ last c/s   CYSTOSCOPY/RETROGRADE/URETEROSCOPY Right 06/26/2017   Procedure: CYSTOSCOPY RIGHT RETROGRADE RIGHT CYSTOSCOPY;  Surgeon: Festus Aloe, MD;  Location: [facility];  Service: Urology;  Laterality: Right;   CYSTOSCOPY/URETEROSCOPY/HOLMIUM LASER/STENT PLACEMENT Right 07/10/2017   Procedure: CYSTOSCOPY/URETEROSCOPY/HOLMIUM LASER/STENT EXCHANGE;  Surgeon: Festus Aloe, MD;  Location: [facility];  Service: Urology;  Laterality: Right;     OB History   No obstetric history on file.     Family History  Problem Relation Age of Onset   Hypertension Other    Stroke Other    Heart failure Other    Heart attack Mother  Stroke Father     Social History   Tobacco Use   Smoking status: Every Day    Packs/day: 0.50    Years: 6.00    Pack years: 3.00    Types: Cigarettes   Smokeless tobacco: Never  Vaping Use   Vaping Use: Never used  Substance Use Topics   Alcohol use: Yes    Comment: occa   Drug use: No    Home Medications Prior to Admission medications   Medication Sig Start Date End Date Taking? Authorizing Provider  methocarbamol  (ROBAXIN) 500 MG tablet Take 1 tablet (500 mg total) by mouth every 8 (eight) hours as needed for muscle spasms. 02/07/21   Libby Maw, MD  acetaminophen (TYLENOL) 500 MG tablet Take 500 mg by mouth every 6 (six) hours as needed.    [provider]  albuterol (VENTOLIN HFA) 108 (90 Base) MCG/ACT inhaler INHALE 1 TO 2 PUFFS INTO THE LUNGS EVERY 6 HOURS AS NEEDED FOR WHEEZING OR SHORTNESS OF BREATH 10/11/19   Libby Maw, MD  aspirin-acetaminophen-caffeine (EXCEDRIN MIGRAINE) [phone] MG tablet Take by mouth every 6 (six) hours as needed for headache.    [provider]  FLUoxetine (PROZAC) 40 MG capsule Take 1 capsule (40 mg total) by mouth daily. 07/26/20   Libby Maw, MD  hydrochlorothiazide (HYDRODIURIL) 25 MG tablet Take 1 tablet (25 mg total) by mouth daily. 07/26/20   Libby Maw, MD  metFORMIN (GLUCOPHAGE) 500 MG tablet Take 1 tablet (500 mg total) by mouth 2 (two) times daily with a meal. 03/18/21 04/17/21  Chase Picket, MD  telmisartan (MICARDIS) 40 MG tablet TAKE 1 TABLET(40 MG) BY MOUTH DAILY 12/03/20   Libby Maw, MD    Allergies    Imitrex [sumatriptan], Oxycodone, and Percocet [oxycodone-acetaminophen]  Review of Systems   Review of Systems  Constitutional:  Positive for appetite change. Negative for fever.  HENT:  Negative for sore throat.   Eyes:  Negative for pain.  Respiratory:  Positive for shortness of breath.   Cardiovascular:  Negative for chest pain and leg swelling.  Gastrointestinal:  Positive for diarrhea, nausea and vomiting. Negative for abdominal pain.  Endocrine: Negative for cold intolerance, heat intolerance, polydipsia, polyphagia and polyuria.  Genitourinary:  Negative for hematuria.  Neurological:  Positive for numbness (Toe numbness). Negative for dizziness and headaches.  All other systems reviewed and are negative.  Physical Exam Updated Vital Signs BP 128/82   Pulse (!) 102    Temp 98.8 F (37.1 C) (Oral)   Resp 16   Ht $R'5\' 2"'LT$  (1.575 m)   Wt 101.2 kg   SpO2 100%   BMI 40.79 kg/m   Physical Exam Constitutional:      General: She is not in acute distress.    Appearance: She is obese. She is not ill-appearing.  HENT:     Head: Normocephalic.  Eyes:     Pupils: Pupils are equal, round, and reactive to light.  Cardiovascular:     Rate and Rhythm: Regular rhythm. Tachycardia present.     Pulses: Normal pulses.     Heart sounds: No murmur heard. Pulmonary:     Effort: Pulmonary effort is normal.     Breath sounds: Normal breath sounds. No wheezing.  Abdominal:     General: There is no distension.     Palpations: Abdomen is soft. There is no mass.     Tenderness: There is no abdominal tenderness.  Musculoskeletal:  Cervical back: Normal range of motion.  Skin:    General: Skin is warm and dry.     Capillary Refill: Capillary refill takes less than 2 seconds.       Neurological:     General: No focal deficit present.     Mental Status: She is alert and oriented to person, place, and time.  Psychiatric:        Mood and Affect: Mood normal.    ED Results / Procedures / Treatments   Labs (all labs ordered are listed, but only abnormal results are displayed) Labs Reviewed  BASIC METABOLIC PANEL - Abnormal; Notable for the following components:      Result Value   Sodium 131 (*)    CO2 9 (*)    Glucose, Bld 331 (*)    Creatinine, Ser 1.21 (*)    GFR, Estimated 56 (*)    Anion gap 17 (*)    All other components within normal limits  BETA-HYDROXYBUTYRIC ACID - Abnormal; Notable for the following components:   Beta-Hydroxybutyric Acid 6.96 (*)    All other components within normal limits  CBC WITH DIFFERENTIAL/PLATELET - Abnormal; Notable for the following components:   WBC 10.9 (*)    Abs Immature Granulocytes 0.08 (*)    All other components within normal limits  URINALYSIS, ROUTINE W REFLEX MICROSCOPIC - Abnormal; Notable for the following  components:   APPearance HAZY (*)    Glucose, UA >=500 (*)    Hgb urine dipstick MODERATE (*)    Ketones, ur 80 (*)    Protein, ur 30 (*)    All other components within normal limits  CBG MONITORING, ED - Abnormal; Notable for the following components:   Glucose-Capillary 331 (*)    All other components within normal limits  CBG MONITORING, ED - Abnormal; Notable for the following components:   Glucose-Capillary 322 (*)    All other components within normal limits  I-STAT VENOUS BLOOD GAS, ED - Abnormal; Notable for the following components:   pH, Ven 7.155 (*)    pCO2, Ven 27.7 (*)    pO2, Ven 187.0 (*)    Bicarbonate 9.8 (*)    TCO2 11 (*)    Acid-base deficit 18.0 (*)    All other components within normal limits  CBG MONITORING, ED - Abnormal; Notable for the following components:   Glucose-Capillary 243 (*)    All other components within normal limits  CBG MONITORING, ED - Abnormal; Notable for the following components:   Glucose-Capillary 151 (*)    All other components within normal limits  RESP PANEL BY RT-PCR (FLU A&B, COVID) ARPGX2  BASIC METABOLIC PANEL  BASIC METABOLIC PANEL  BASIC METABOLIC PANEL  BETA-HYDROXYBUTYRIC ACID  PROINSULIN/INSULIN RATIO  LIPID PANEL  HIV ANTIBODY (ROUTINE TESTING W REFLEX)  BASIC METABOLIC PANEL  BETA-HYDROXYBUTYRIC ACID  I-STAT BETA HCG BLOOD, ED ([facility], [facility], [facility] ONLY)    EKG None  Radiology No results found.  Procedures .Critical Care E&M Performed by: Azucena Cecil, PA  Critical care provider statement:    Critical care time (minutes):  30   Critical care was necessary to treat or prevent imminent or life-threatening deterioration of the following conditions:  Endocrine crisis   Critical care was time spent personally by me on the following activities:  Ordering and performing treatments and interventions, ordering and review of laboratory studies, pulse oximetry, re-evaluation of patient's condition, review of old  charts, obtaining history from patient or surrogate, examination of patient,  evaluation of patient's response to treatment, discussions with consultants and development of treatment plan with patient or surrogate   Care discussed with: admitting provider   After initial E/M assessment, critical care services were subsequently performed that were exclusive of separately billable procedures or treatment.     Medications Ordered in ED Medications  insulin regular, human (MYXREDLIN) 100 units/ 100 mL infusion (7.5 Units/hr Intravenous Rate/Dose Change 03/19/21 1157)  lactated ringers infusion (0 mLs Intravenous Stopped 03/19/21 1155)  dextrose 5 % in lactated ringers infusion ( Intravenous New Bag/Given 03/19/21 1154)  dextrose 50 % solution 0-50 mL (has no administration in time range)  ondansetron (ZOFRAN) injection 4 mg (0 mg Intravenous Hold 03/19/21 1234)  acetaminophen (TYLENOL) tablet 500 mg (has no administration in time range)  aspirin-acetaminophen-caffeine (EXCEDRIN MIGRAINE) per tablet 1 tablet (has no administration in time range)  hydrochlorothiazide (HYDRODIURIL) tablet 25 mg (has no administration in time range)  irbesartan (AVAPRO) tablet 150 mg (has no administration in time range)  FLUoxetine (PROZAC) capsule 40 mg (40 mg Oral Given 03/19/21 1235)  methocarbamol (ROBAXIN) tablet 500 mg (has no administration in time range)  albuterol (PROVENTIL) (2.5 MG/3ML) 0.083% nebulizer solution 2.5 mg (has no administration in time range)  enoxaparin (LOVENOX) injection 50 mg (has no administration in time range)  ondansetron (ZOFRAN) injection 4 mg (has no administration in time range)  living well with diabetes book MISC (has no administration in time range)  insulin starter kit- pen needles (English) 1 kit (has no administration in time range)  lactated ringers bolus 1,000 mL (0 mLs Intravenous Stopped 03/19/21 1150)  potassium chloride 10 mEq in 100 mL IVPB (0 mEq Intravenous Stopped  03/19/21 1317)  lactated ringers bolus 2,024 mL (2,024 mLs Intravenous New Bag/Given 03/19/21 1238)    ED Course  I have reviewed the triage vital signs and the nursing notes.  Pertinent labs & imaging results that were available during my care of the patient were reviewed by me and considered in my medical decision making (see chart for details).    MDM Rules/Calculators/A&P                          [id number] who presents to ED due to high blood glucose readings on husband's home meter. Patient presented to urgent care yesterday and was informed she was in DKA and instructed to present to ED for further treatment. Patient received fluid bolus at urgent care but no further treatment. Labs from urgent care visit day prior indicate patient is in DKA based on CMP with BG of 403, anion gap of 19 and CO2 of 9 indicating a metabolic acidosis with attempted respiratory compensation. Patient also has AKI with creatinine of 1.4 which is a change from her baseline.  On labs today, patient has pH of 7.2 on VBG, pCO2 of 27, pO2 of 187 and bicarb of 9.8. CBG of 322  Patient DKA order set/protocol will be initiated and patient will be treated as DKA and will require admission for stabilization. Patient given IV bolus of fluid and started on insulin drip. Patient case discussed with admitting hospitalist who has agreed to admit patient.   Final Clinical Impression(s) / ED Diagnoses Final diagnoses:  Hyperglycemia  Diabetic ketoacidosis without coma associated with type 2 diabetes mellitus [facility])    Rx / DC Orders ED Discharge Orders     None        Al Decant, [place] 03/19/21 1330  Azucena Cecil, [place] 03/19/21 1332    Azucena Cecil, [place] 03/19/21 1501    Lucrezia Starch, MD 03/19/21 [phone]

## 2021-03-19 NOTE — ED Notes (Signed)
Dietary called for dinner tray

## 2021-03-19 NOTE — Progress Notes (Addendum)
Inpatient Diabetes Program Recommendations  AACE/ADA: New Consensus Statement on Inpatient Glycemic Control (2015)  Target Ranges:  Prepandial:   less than 140 mg/dL      Peak postprandial:   less than 180 mg/dL (1-2 hours)      Critically ill patients:  140 - 180 mg/dL   Lab Results  Component Value Date   GLUCAP 331 (H) 03/19/2021   HGBA1C 12.0 (H) 03/18/2021    Review of Glycemic Control Results for Tracy Johns, Tracy Johns (MRN [id number]) as of 03/19/2021 10:29  Ref. Range 03/18/2021 10:29 03/19/2021 09:44  Glucose-Capillary Latest Ref Range: 70 - 99 mg/dL 401 (H) 331 (H)  Results for Tracy Johns, Tracy Johns (MRN [id number]) as of 03/19/2021 10:29  Ref. Range 03/18/2021 12:19  Potassium Latest Ref Range: 3.5 - 5.1 mmol/L 4.4  Chloride Latest Ref Range: 98 - 111 mmol/L 101  CO2 Latest Ref Range: 22 - 32 mmol/L 9 (L)  Glucose Latest Ref Range: 70 - 99 mg/dL 403 (H)  Mean Plasma Glucose Latest Units: mg/dL 297.7  BUN Latest Ref Range: 6 - 20 mg/dL 13  Creatinine Latest Ref Range: 0.44 - 1.00 mg/dL 1.40 (H)  Calcium Latest Ref Range: 8.9 - 10.3 mg/dL 9.1  Anion gap Latest Ref Range: 5 - 15  19 (H)   Diabetes history: No prior hx DM Outpatient Diabetes medications: Metformin 500 mg bid (started early this am) Current orders for Inpatient glycemic control: IV insulin per endotool  Inpatient Diabetes Program Recommendations:   Patient currently in ED with new onset diabetes.  Will follow during hospitalization and plan to speak with patient.  Patient was just given news from her doctor that she had new onset DM2 yesterday and had just picked up prescription and started Metformin. Patient states her husband and daughter both has type 1 diabetes but "I've taken care of everyone but myself". Patient has been drinking some sweetened drinks but willing to decrease sugary drinks and carbohydrates. Discussed basic nutrition information regarding plate method.  Spoke with pt @ bedside about  new diagnosis. Discussed A1C results with them and explained what an A1C is, basic pathophysiology of DM Type 2, basic home care, basic diabetes diet nutrition principles, importance of checking CBGs and maintaining good CBG control to prevent long-term and short-term complications. Reviewed signs and symptoms of hyperglycemia and hypoglycemia and how to treat hypoglycemia at home. Also reviewed blood sugar goals at home.  RNs to provide ongoing basic DM education at bedside with this patient. Have ordered educational booklet and insulin starter kit. Have also placed RD consult for DM diet education for this patient.    Thank you, Nani Gasser. Felder Lebeda, RN, MSN, CDE  Diabetes Coordinator Inpatient Glycemic Control Team Team Pager [phone] (8am-5pm) 03/19/2021 10:42 AM

## 2021-03-19 NOTE — ED Triage Notes (Signed)
[facility] reports pt coming from home c/o her sugar being high. Been over 500, today it was 330. Pt states she has not felt well and having pain in her back and right leg. Pt denies any n/v today. Did have some on Friday.

## 2021-03-20 ENCOUNTER — Inpatient Hospital Stay (HOSPITAL_COMMUNITY): Payer: 59

## 2021-03-20 DIAGNOSIS — R0609 Other forms of dyspnea: Secondary | ICD-10-CM

## 2021-03-20 DIAGNOSIS — E1110 Type 2 diabetes mellitus with ketoacidosis without coma: Principal | ICD-10-CM

## 2021-03-20 LAB — BASIC METABOLIC PANEL
Anion gap: 14 (ref 5–15)
Anion gap: 13 (ref 5–15)
BUN: 8 mg/dL (ref 6–20)
BUN: 6 mg/dL (ref 6–20)
CO2: 13 mmol/L — ABNORMAL LOW (ref 22–32)
CO2: 16 mmol/L — ABNORMAL LOW (ref 22–32)
Calcium: 8.8 mg/dL — ABNORMAL LOW (ref 8.9–10.3)
Calcium: 8.5 mg/dL — ABNORMAL LOW (ref 8.9–10.3)
Chloride: 105 mmol/L (ref 98–111)
Chloride: 105 mmol/L (ref 98–111)
Creatinine, Ser: 1.16 mg/dL — ABNORMAL HIGH (ref 0.44–1.00)
Creatinine, Ser: 0.95 mg/dL (ref 0.44–1.00)
GFR, Estimated: 59 mL/min — ABNORMAL LOW (ref >60)
GFR, Estimated: >60 mL/min (ref >60)
Glucose, Bld: 276 mg/dL — ABNORMAL HIGH (ref 70–99)
Glucose, Bld: 289 mg/dL — ABNORMAL HIGH (ref 70–99)
Potassium: 3.6 mmol/L (ref 3.5–5.1)
Potassium: 3.4 mmol/L — ABNORMAL LOW (ref 3.5–5.1)
Sodium: 132 mmol/L — ABNORMAL LOW (ref 135–145)
Sodium: 134 mmol/L — ABNORMAL LOW (ref 135–145)

## 2021-03-20 LAB — ECHOCARDIOGRAM COMPLETE
AR max vel: 1.6 cm2
AV Area VTI: 1.61 cm2
AV Area mean vel: 1.59 cm2
AV Mean grad: 6 mmHg
AV Peak grad: 13 mmHg
Ao pk vel: 1.8 m/s
Area-P 1/2: 5.62 cm2
Height: 62 in
S' Lateral: 2 cm
Weight: 3568 oz

## 2021-03-20 LAB — GLUCOSE, CAPILLARY
Glucose-Capillary: 307 mg/dL — ABNORMAL HIGH (ref 70–99)
Glucose-Capillary: 263 mg/dL — ABNORMAL HIGH (ref 70–99)
Glucose-Capillary: 278 mg/dL — ABNORMAL HIGH (ref 70–99)
Glucose-Capillary: 302 mg/dL — ABNORMAL HIGH (ref 70–99)
Glucose-Capillary: 223 mg/dL — ABNORMAL HIGH (ref 70–99)

## 2021-03-20 MED ORDER — INSULIN ASPART 100 UNIT/ML IJ SOLN
2.0000 [IU] | Freq: Three times a day (TID) | INTRAMUSCULAR | Status: DC
  Administered 2021-03-20 – 2021-03-21 (×2): 2 [IU] via SUBCUTANEOUS

## 2021-03-20 MED ORDER — LACTATED RINGERS IV SOLN: INTRAVENOUS | Status: AC

## 2021-03-20 MED ORDER — INSULIN DETEMIR 100 UNIT/ML ~~LOC~~ SOLN
25.0000 [IU] | Freq: Every day | SUBCUTANEOUS | Status: DC
  Administered 2021-03-21: 25 [IU] via SUBCUTANEOUS
  Filled 2021-03-20: qty 0.25

## 2021-03-20 MED ORDER — INSULIN DETEMIR 100 UNIT/ML ~~LOC~~ SOLN
20.0000 [IU] | SUBCUTANEOUS | Status: AC
  Administered 2021-03-20: 20 [IU] via SUBCUTANEOUS
  Filled 2021-03-20: qty 0.2

## 2021-03-20 MED ORDER — INSULIN ASPART 100 UNIT/ML IJ SOLN
4.0000 [IU] | Freq: Once | INTRAMUSCULAR | Status: AC
Start: 2021-03-20 — End: 2021-03-20
  Administered 2021-03-20: 4 [IU] via SUBCUTANEOUS

## 2021-03-20 NOTE — TOC Initial Note (Signed)
Transition of Care [facility]) - Initial/Assessment Note    Patient Details  Name: Tracy Johns MRN: [id number] Date of Birth: 1976/02/18  [facility]) CM/SW Contact:    Tom-Johnson, Renea Ee, RN Phone Number: 03/20/2021, 12:02 PM  Clinical Narrative:                 CM spoke with patient at bedside about needs for post hospital transition. Presented with nausea, vomiting and general weakness. Found to be in DKA. Diabetic Coordinator and Registered Dietician consulted. Patient states she lives with her husband, daughter and granddaughter. Has four children and five grand children. Currently employed by [employer].Independent with care and drives self prior to hospitalization. Does not have and does not use DME's. PCP is Abelino Derrick and uses [facility] on [address]. Has [employer]. Husband will transport at discharge. Medical workup continues. CM will follow up with needs.     Barriers to Discharge: Continued Medical Work up   Patient Goals and CMS Choice Patient states their goals for this hospitalization and ongoing recovery are:: To go home CMS Medicare.gov Compare Post Acute Care list provided to:: Patient    Expected Discharge Plan and Services     Discharge Planning Services: CM Consult   Living arrangements for the past 2 months: Apartment                                      Prior Living Arrangements/Services Living arrangements for the past 2 months: Apartment Lives with:: Spouse, Adult Children, Minor Children (Daughter and grand daughter) Patient language and need for interpreter reviewed:: Yes Do you feel safe going back to the place where you live?: Yes      Need for Family Participation in Patient Care: Yes (Comment) Care giver support system in place?: Yes (comment)   Criminal Activity/Legal Involvement Pertinent to Current Situation/Hospitalization: No - Comment as needed  Activities of Daily  Living Home Assistive Devices/Equipment: None ADL Screening (condition at time of admission) Patient's cognitive ability adequate to safely complete daily activities?: Yes Is the patient deaf or have difficulty hearing?: No Does the patient have difficulty seeing, even when wearing glasses/contacts?: No Does the patient have difficulty concentrating, remembering, or making decisions?: No Patient able to express need for assistance with ADLs?: Yes Does the patient have difficulty dressing or bathing?: No Independently performs ADLs?: Yes (appropriate for developmental age) Does the patient have difficulty walking or climbing stairs?: No Weakness of Legs: None Weakness of Arms/Hands: None  Permission Sought/Granted Permission sought to share information with : Case Manager, Family Supports Permission granted to share information with : Yes, Verbal Permission Granted              Emotional Assessment Appearance:: Appears stated age Attitude/Demeanor/Rapport: Engaged Affect (typically observed): Accepting, Appropriate, Calm, Hopeful Orientation: : Oriented to Self, Oriented to Place, Oriented to  Time, Oriented to Situation Alcohol / Substance Use: Not Applicable Psych Involvement: No (comment)  Admission diagnosis:  DKA (diabetic ketoacidosis) ([place]) [E11.10] Hyperglycemia [R73.9] Diabetic ketoacidosis without coma associated with type 2 diabetes mellitus ([place]) [E11.10] Patient Active Problem List   Diagnosis Date Noted   DKA (diabetic ketoacidosis) ([place]) 03/19/2021   Hyperglycemia    Class 2 obesity due to excess calories with body mass index (BMI) of 38.0 to 38.9 in adult 03/12/2020   Elevated LDL cholesterol level 03/12/2020   Reactive airway disease 09/19/2019  Left low back pain 08/17/2019   Elevated glucose 08/17/2019   Depression with anxiety 07/04/2019   Tobacco use 07/04/2019   Abnormal thyroid function test 10/06/2017   Nephrolithiasis 06/27/2017   Hypokalemia  06/27/2017   Hyponatremia 06/27/2017   Essential hypertension 06/27/2017   Sepsis ([place]) 06/27/2017   Pyelonephritis 06/26/2017   Empty sella ([place]) 04/06/2017   Prolactinoma ([place]) 04/06/2017   High serum adrenocorticotropic hormone (ACTH) 04/06/2017   Hyperprolactinemia ([place]) 10/02/2016   PCP:  Libby Maw, MD Pharmacy:   [facility] [address] [place] [address] Phone: [phone] Fax: [phone]     Social Determinants of Health (SDOH) Interventions    Readmission Risk Interventions No flowsheet data found.

## 2021-03-20 NOTE — Progress Notes (Addendum)
TRH night cross cover note:  RN notified me that patient had requested random Accu-Chek around 4 AM this morning, prompted by the patient's reported anxiety over potential interval increase in blood sugar following initial dka presentation.  Otherwise, patient currently asymptomatic.    Accu-Chek around 4 AM found to be 307, compared to 270 last night.  She is not due for any insulin until SSI via QAC breakfast CBG.  In the setting of presenting DKA as well as the patient's current anxiety over ensuing escalation of her hyperglycemia, I have ordered NovoLog 4 units subcu x1 dose now and started continuous lactated Ringer's at 100 cc/h x 8 hours, with plan to recheck blood sugar via  QAC breakfast CBG. BMP has been ordered for the AM.      Babs Bertin, DO Hospitalist

## 2021-03-20 NOTE — Progress Notes (Addendum)
PROGRESS NOTE    George Alcantar  JYN:[id number] DOB: December 30, 1975 DOA: 03/19/2021 PCP: Libby Maw, MD  Brief Narrative: 45/F with history of hypertension, dyslipidemia, migraines presented to the ED with generalized weakness nausea and vomiting, her blood sugar the day of admission was>500, labs in the ED were concerning for DKA   Assessment & Plan:   Diabetic ketoacidosis -Suspect type 2 diabetes, hemoglobin A1c is 12.0 -Off insulin drip this morning, start CBG noted blood glucose in the 200s and anion gap of 13, I started her on Levemir -Insulin teaching -Diabetes coordinator, dietitian consulted -home Tomorrow if stable  Abnormal EKG -Q waves noted in inferior leads, will obtain echocardiogram, denies any cardiac symptoms  Hyponatremia -Secondary to dehydration, improving  Hypokalemia -Replaced  Hypertension -Stable, continue ARB  DVT prophylaxis: Lovenox Code Status: Full code Family Communication: Discussed patient in detail, family at bedside Disposition Plan:  Status is: Inpatient  Remains inpatient appropriate because: Severity of illness    Procedures:   Antimicrobials:    Subjective: -Feels better today, denies any nausea vomiting, no chest pain  Objective: Vitals:   03/19/21 1946 03/20/21 0641 03/20/21 0801 03/20/21 0934  BP: 124/77 123/83 116/80 122/68  Pulse: 91 78 93 89  Resp: $Remo'16 18 14 18  'rXlrt$ Temp: 98.7 F (37.1 C) 98 F (36.7 C) 98.4 F (36.9 C) 98.5 F (36.9 C)  TempSrc: Oral  Oral Oral  SpO2: 100% 100% 100% 100%  Weight:      Height:        Intake/Output Summary (Last 24 hours) at 03/20/2021 1353 Last data filed at 03/20/2021 1300 Gross per 24 hour  Intake 3303.81 ml  Output 1 ml  Net 3302.81 ml   Filed Weights   03/19/21 0936  Weight: 101.2 kg    Examination:  General exam: Appears calm and comfortable  Respiratory system: Clear to auscultation Cardiovascular system: S1 & S2 heard, RRR.  Abd:  nondistended, soft and nontender.Normal bowel sounds heard. Central nervous system: Alert and oriented. No focal neurological deficits. Extremities:no edema Skin: No rashes Psychiatry: Judgement and insight appear normal. Mood & affect appropriate.     Data Reviewed:   CBC: Recent Labs  Lab 03/18/21 1219 03/19/21 1004 03/19/21 1025  WBC 11.7* 10.9*  --   NEUTROABS 8.3* 7.2  --   HGB 14.0 13.2 14.3  HCT 43.4 40.3 42.0  MCV 96.7 97.1  --   PLT 390 328  --    Basic Metabolic Panel: Recent Labs  Lab 03/18/21 1219 03/19/21 1004 03/19/21 1025 03/19/21 1404 03/20/21 0444 03/20/21 0806  NA 129* 131* 135 134* 132* 134*  K 4.4 4.0 3.9 3.6 3.6 3.4*  CL 101 105  --  110 105 105  CO2 9* 9*  --  11* 13* 16*  GLUCOSE 403* 331*  --  134* 276* 289*  BUN 13 11  --  $R'8 8 6  'ec$ CREATININE 1.40* 1.21*  --  1.02* 1.16* 0.95  CALCIUM 9.1 9.0  --  8.2* 8.8* 8.5*   GFR: Estimated Creatinine Clearance: 82.4 mL/min (by C-G formula based on SCr of 0.95 mg/dL). Liver Function Tests: Recent Labs  Lab 03/18/21 1219  AST 20  ALT 31  ALKPHOS 101  BILITOT 1.7*  PROT 8.8*  ALBUMIN 4.6   No results for input(s): LIPASE, AMYLASE in the last 168 hours. No results for input(s): AMMONIA in the last 168 hours. Coagulation Profile: No results for input(s): INR, PROTIME in the last 168 hours. Cardiac Enzymes: No  results for input(s): CKTOTAL, CKMB, CKMBINDEX, TROPONINI in the last 168 hours. BNP (last 3 results) No results for input(s): PROBNP in the last 8760 hours. HbA1C: Recent Labs    03/18/21 1219  HGBA1C 12.0*   CBG: Recent Labs  Lab 03/19/21 1816 03/19/21 1954 03/20/21 0349 03/20/21 0717 03/20/21 1157  GLUCAP 192* 270* 307* 263* 278*   Lipid Profile: Recent Labs    03/19/21 1404  CHOL 241*  HDL 24*  LDLCALC 179*  TRIG 190*  CHOLHDL 10.0   Thyroid Function Tests: No results for input(s): TSH, T4TOTAL, FREET4, T3FREE, THYROIDAB in the last 72 hours. Anemia Panel: No  results for input(s): VITAMINB12, FOLATE, FERRITIN, TIBC, IRON, RETICCTPCT in the last 72 hours. Urine analysis:    Component Value Date/Time   COLORURINE YELLOW 03/19/2021 1037   APPEARANCEUR HAZY (A) 03/19/2021 1037   LABSPEC 1.018 03/19/2021 1037   PHURINE 5.0 03/19/2021 1037   GLUCOSEU >=500 (A) 03/19/2021 1037   HGBUR MODERATE (A) 03/19/2021 1037   BILIRUBINUR NEGATIVE 03/19/2021 1037   KETONESUR 80 (A) 03/19/2021 1037   PROTEINUR 30 (A) 03/19/2021 1037   UROBILINOGEN 0.2 02/13/2014 1308   NITRITE NEGATIVE 03/19/2021 1037   LEUKOCYTESUR NEGATIVE 03/19/2021 1037   Sepsis Labs: $RemoveBefo'@LABRCNTIP'QbJGksZEHnp$ (procalcitonin:4,lacticidven:4)  ) Recent Results (from the past 240 hour(s))  Resp Panel by RT-PCR (Flu A&B, Covid) Nasopharyngeal Swab     Status: None   Collection Time: 03/19/21 10:08 AM   Specimen: Nasopharyngeal Swab; Nasopharyngeal(NP) swabs in vial transport medium  Result Value Ref Range Status   SARS Coronavirus 2 by RT PCR NEGATIVE NEGATIVE Final    Comment: (NOTE) SARS-CoV-2 target nucleic acids are NOT DETECTED.  The SARS-CoV-2 RNA is generally detectable in upper respiratory specimens during the acute phase of infection. The lowest concentration of SARS-CoV-2 viral copies this assay can detect is 138 copies/mL. A negative result does not preclude SARS-Cov-2 infection and should not be used as the sole basis for treatment or other patient management decisions. A negative result may occur with  improper specimen collection/handling, submission of specimen other than nasopharyngeal swab, presence of viral mutation(s) within the areas targeted by this assay, and inadequate number of viral copies(<138 copies/mL). A negative result must be combined with clinical observations, patient history, and epidemiological information. The expected result is Negative.  Fact Sheet for Patients:  EntrepreneurPulse.com.au  Fact Sheet for Healthcare Providers:   IncredibleEmployment.be  This test is no t yet approved or cleared by the [place] FDA and  has been authorized for detection and/or diagnosis of SARS-CoV-2 by FDA under an Emergency Use Authorization (EUA). This EUA will remain  in effect (meaning this test can be used) for the duration of the COVID-19 declaration under Section 564(b)(1) of the Act, 21 U.S.C.section 360bbb-3(b)(1), unless the authorization is terminated  or revoked sooner.       Influenza A by PCR NEGATIVE NEGATIVE Final   Influenza B by PCR NEGATIVE NEGATIVE Final    Comment: (NOTE) The Xpert Xpress SARS-CoV-2/FLU/RSV plus assay is intended as an aid in the diagnosis of influenza from Nasopharyngeal swab specimens and should not be used as a sole basis for treatment. Nasal washings and aspirates are unacceptable for Xpert Xpress SARS-CoV-2/FLU/RSV testing.  Fact Sheet for Patients: EntrepreneurPulse.com.au  Fact Sheet for Healthcare Providers: IncredibleEmployment.be  This test is not yet approved or cleared by the [place] FDA and has been authorized for detection and/or diagnosis of SARS-CoV-2 by FDA under an Emergency Use Authorization (EUA). This EUA will  remain in effect (meaning this test can be used) for the duration of the COVID-19 declaration under Section 564(b)(1) of the Act, 21 U.S.C. section 360bbb-3(b)(1), unless the authorization is terminated or revoked.  Performed at [facility], [place] [address] [place], [place] [address]      Scheduled Meds:  enoxaparin (LOVENOX) injection  50 mg Subcutaneous Q24H   FLUoxetine  40 mg Oral Daily   hydrochlorothiazide  25 mg Oral Daily   insulin aspart  0-15 Units Subcutaneous TID WC   insulin starter kit- pen needles  1 kit Other Once   irbesartan  150 mg Oral Daily   living well with diabetes book   Does not apply Once   ondansetron (ZOFRAN) IV  4 mg Intravenous Once    Continuous Infusions:   LOS: 1 day    Time spent: 58min  Domenic Polite, MD [facility]   03/20/2021, 1:53 PM

## 2021-03-20 NOTE — Progress Notes (Signed)
  Echocardiogram 2D Echocardiogram has been performed.  Merrie Roof F 03/20/2021, 2:13 PM

## 2021-03-20 NOTE — Plan of Care (Signed)
  Problem: Education: Goal: Ability to describe self-care measures that may prevent or decrease complications (Diabetes Survival Skills Education) will improve 03/20/2021 0342 by Kirtland Bouchard, RN Outcome: Progressing 03/19/2021 2052 by Kirtland Bouchard, RN Outcome: Progressing Goal: Individualized Educational Video(s) 03/20/2021 0342 by Kirtland Bouchard, RN Outcome: Progressing 03/19/2021 2052 by Kirtland Bouchard, RN Outcome: Progressing   Problem: Coping: Goal: Ability to adjust to condition or change in health will improve 03/20/2021 0342 by Kirtland Bouchard, RN Outcome: Progressing 03/19/2021 2052 by Kirtland Bouchard, RN Outcome: Progressing

## 2021-03-21 DIAGNOSIS — E1110 Type 2 diabetes mellitus with ketoacidosis without coma: Secondary | ICD-10-CM | POA: Diagnosis not present

## 2021-03-21 LAB — BASIC METABOLIC PANEL
Anion gap: 14 (ref 5–15)
BUN: 10 mg/dL (ref 6–20)
CO2: 17 mmol/L — ABNORMAL LOW (ref 22–32)
Calcium: 8.9 mg/dL (ref 8.9–10.3)
Chloride: 102 mmol/L (ref 98–111)
Creatinine, Ser: 0.88 mg/dL (ref 0.44–1.00)
GFR, Estimated: >60 mL/min (ref >60)
Glucose, Bld: 285 mg/dL — ABNORMAL HIGH (ref 70–99)
Potassium: 3.1 mmol/L — ABNORMAL LOW (ref 3.5–5.1)
Sodium: 133 mmol/L — ABNORMAL LOW (ref 135–145)

## 2021-03-21 LAB — GLUCOSE, CAPILLARY
Glucose-Capillary: 93 mg/dL (ref 70–99)
Glucose-Capillary: 394 mg/dL — ABNORMAL HIGH (ref 70–99)

## 2021-03-21 LAB — CBC
HCT: 34 % — ABNORMAL LOW (ref 36.0–46.0)
Hemoglobin: 11.2 g/dL — ABNORMAL LOW (ref 12.0–15.0)
MCH: 31.4 pg (ref 26.0–34.0)
MCHC: 32.9 g/dL (ref 30.0–36.0)
MCV: 95.2 fL (ref 80.0–100.0)
Platelets: 315 10*3/uL (ref 150–400)
RBC: 3.57 MIL/uL — ABNORMAL LOW (ref 3.87–5.11)
RDW: 12.7 % (ref 11.5–15.5)
WBC: 7.8 10*3/uL (ref 4.0–10.5)
nRBC: 0 % (ref 0.0–0.2)

## 2021-03-21 MED ORDER — "PEN NEEDLES 3/16"" 31G X 5 MM MISC": 25.0000 [IU] | Freq: Every day | 1 refills | Status: DC

## 2021-03-21 MED ORDER — POTASSIUM CHLORIDE CRYS ER 20 MEQ PO TBCR
40.0000 meq | EXTENDED_RELEASE_TABLET | Freq: Once | ORAL | Status: AC
  Administered 2021-03-21: 40 meq via ORAL
  Filled 2021-03-21: qty 2

## 2021-03-21 MED ORDER — BLOOD GLUCOSE MONITOR KIT: PACK | 0 refills | Status: DC

## 2021-03-21 MED ORDER — INSULIN DETEMIR 100 UNIT/ML FLEXPEN: 25.0000 [IU] | PEN_INJECTOR | Freq: Every day | SUBCUTANEOUS | 0 refills | Status: DC

## 2021-03-21 NOTE — Plan of Care (Signed)
Nutrition Education Note  RD consulted for nutrition education regarding new onset type 2 diabetes.   Lab Results  Component Value Date   HGBA1C 12.0 (H) 03/18/2021   RD provided "Carbohydrate Counting for People with Diabetes" handout from the Academy of Nutrition and Dietetics. Discussed different food groups and their effects on blood sugar, emphasizing carbohydrate-containing foods. Provided list of carbohydrates and recommended serving sizes of common foods.  Pt explained that husband and daughter both have T1DM, so she is familiar with how to count carbs. Pt appreciative of outpatient referral to [facility].  Discussed importance of controlled and consistent carbohydrate intake throughout the day. Provided examples of ways to balance meals/snacks and encouraged intake of high-fiber, whole grain complex carbohydrates. Teach back method used.  Expect good compliance.  Body mass index is 40.79 kg/m. Pt meets criteria for Obesity Class 3 based on current BMI.  Current diet order is Heart Healthy/Carb Modified, patient is consuming approximately 75-100% of meals at this time. Labs and medications reviewed.   No further nutrition interventions warranted at this time. RD contact information provided. If additional nutrition issues arise, please re-consult RD.  Derrel Nip, RD, LDN (she/her/hers) Clinical Inpatient Dietitian RD Pager/After-Hours/Weekend Pager # in [place]

## 2021-03-21 NOTE — TOC Benefit Eligibility Note (Signed)
Transition of Care [facility]) Benefit Eligibility Note    Patient Details  Name: Tracy Johns MRN: [id number] Date of Birth: [birth year]   Medication/Dose: [name] FLEX PEN  Covered?: Yes  Tier:  (PREFERRED)  Prescription Coverage Preferred Pharmacy: Roseanne Kaufman with Person/Company/Phone Number:: [facility]  @ [employer] [facility] # [phone]  Co-Pay: $10.00  Prior Approval: No  Deductible: Unmet (OUT-OF-POCKET:UNMET)  Additional Notes: DETEMIR :  [name] Phone Number: 03/21/2021, 1:06 PM

## 2021-03-21 NOTE — Plan of Care (Signed)
  Problem: Coping: Goal: Ability to adjust to condition or change in health will improve Outcome: Progressing   Problem: Nutritional: Goal: Maintenance of adequate nutrition will improve Outcome: Progressing   Problem: Skin Integrity: Goal: Risk for impaired skin integrity will decrease Outcome: Progressing   

## 2021-03-21 NOTE — Discharge Summary (Signed)
Physician Discharge Summary  Tracy Johns MPE:[id number] DOB: [birth year] DOA: 03/19/2021  PCP: Mliss Sax, MD  Admit date: 03/19/2021 Discharge date: 03/21/2021  Time spent: 35 minutes  Recommendations for Outpatient Follow-up:  PCP in 1 week, newly started on Insulin   Discharge Diagnoses:  Active Problems:   DKA (diabetic ketoacidosis) (HCC) Hyponatremia Hypokalemia Hypertension  Discharge Condition: Stable  Diet recommendation: Diabetic  Filed Weights   03/19/21 0936  Weight: 101.2 kg    History of present illness:  45/F with history of hypertension, dyslipidemia, migraines presented to the ED with generalized weakness nausea and vomiting, her blood sugar the day of admission was>500, labs in the ED were concerning for DKA  Hospital Course:   Diabetic ketoacidosis -Suspect type 2 diabetes, hemoglobin A1c is 12.0 -Treated with insulin drip, IV fluids  -Transition to Levemir and sliding scale insulin, insulin teaching completed, seen by diabetes coordinator and dietitian consultation, discharged home in a stable condition on Levemir, advised compliance with diabetic diet and weight loss  Abnormal EKG -Q waves noted in inferior leads, 2D echocardiogram was normal   Hyponatremia -Secondary to dehydration, improving   Hypokalemia -Replaced   Hypertension -Stable, continue ARB  Obesity, BMI 40.9 -Weight loss recommended  Discharge Exam: Vitals:   03/21/21 0439 03/21/21 0920  BP: 125/74 118/80  Pulse: 82 91  Resp: 19 18  Temp: 98.6 F (37 C) 97.9 F (36.6 C)  SpO2: 98% 99%   Gen: Awake, Alert, Oriented X 3,  HEENT: no JVD Lungs: Good air movement bilaterally, CTAB CVS: S1S2/RRR Abd: soft, Non tender, non distended, BS present Extremities: No edema Skin: no new rashes on exposed skin   Discharge Instructions   Discharge Instructions     Amb Referral to Nutrition and Diabetic Education   Complete by: As directed    Diet -  low sodium heart healthy   Complete by: As directed    Diet Carb Modified   Complete by: As directed    Increase activity slowly   Complete by: As directed       Allergies as of 03/21/2021       Reactions   Imitrex [sumatriptan] Nausea And Vomiting   Oxycodone Itching   Percocet "feel extremely itching and skin burns"   Percocet [oxycodone-acetaminophen]    itching        Medication List     TAKE these medications    acetaminophen 500 MG tablet Commonly known as: TYLENOL Take 500 mg by mouth every 6 (six) hours as needed.   albuterol 108 (90 Base) MCG/ACT inhaler Commonly known as: VENTOLIN HFA INHALE 1 TO 2 PUFFS INTO THE LUNGS EVERY 6 HOURS AS NEEDED FOR WHEEZING OR SHORTNESS OF BREATH What changed: See the new instructions.   aspirin-acetaminophen-caffeine 250-250-65 MG tablet Commonly known as: EXCEDRIN MIGRAINE Take by mouth every 6 (six) hours as needed for headache.   blood glucose meter kit and supplies Kit Dispense based on patient and insurance preference. Use up to four times daily as directed.   FLUoxetine 40 MG capsule Commonly known as: PROZAC Take 1 capsule (40 mg total) by mouth daily.   hydrochlorothiazide 25 MG tablet Commonly known as: HYDRODIURIL Take 1 tablet (25 mg total) by mouth daily.   insulin detemir 100 UNIT/ML FlexPen Commonly known as: LEVEMIR Inject 25 Units into the skin daily.   metFORMIN 500 MG tablet Commonly known as: GLUCOPHAGE Take 1 tablet (500 mg total) by mouth 2 (two) times daily with a meal.  methocarbamol 500 MG tablet Commonly known as: ROBAXIN Take 1 tablet (500 mg total) by mouth every 8 (eight) hours as needed for muscle spasms.   Pen Needles 3/16" 31G X 5 MM Misc 25 Units by Does not apply route daily.   telmisartan 40 MG tablet Commonly known as: MICARDIS TAKE 1 TABLET(40 MG) BY MOUTH DAILY What changed: See the new instructions.       Allergies  Allergen Reactions   Imitrex [Sumatriptan]  Nausea And Vomiting   Oxycodone Itching    Percocet "feel extremely itching and skin burns"   Percocet [Oxycodone-Acetaminophen]     itching      The results of significant diagnostics from this hospitalization (including imaging, microbiology, ancillary and laboratory) are listed below for reference.    Significant Diagnostic Studies: ECHOCARDIOGRAM COMPLETE  Result Date: 03/20/2021    ECHOCARDIOGRAM REPORT   Patient Name:   Tracy Johns Date of Exam: 03/20/2021 Medical Rec #:  [id number]               Height:       62.0 in Accession #:    [id number]              Weight:       223.0 lb Date of Birth:  Mar 06, 1976               BSA:          2.002 m Patient Age:    45 years                BP:           122/68 mmHg Patient Gender: F                       HR:           79 bpm. Exam Location:  Inpatient Procedure: 2D Echo, Cardiac Doppler and Color Doppler Indications:    Dyspnea R06.00  History:        Patient has no prior history of Echocardiogram examinations.                 Risk Factors:Obesity.  Sonographer:    Merrie Roof RDCS Referring Phys: [place]  1. Left ventricular ejection fraction, by estimation, is 60 to 65%. The left ventricle has normal function. The left ventricle has no regional wall motion abnormalities. Left ventricular diastolic parameters are consistent with Grade I diastolic dysfunction (impaired relaxation).  2. Right ventricular systolic function is normal. The right ventricular size is normal. Tricuspid regurgitation signal is inadequate for assessing PA pressure.  3. The mitral valve is normal in structure. No evidence of mitral valve regurgitation. No evidence of mitral stenosis.  4. The aortic valve is tricuspid. Aortic valve regurgitation is not visualized. No aortic stenosis is present. FINDINGS  Left Ventricle: Left ventricular ejection fraction, by estimation, is 60 to 65%. The left ventricle has normal function. The left ventricle has no  regional wall motion abnormalities. The left ventricular internal cavity size was normal in size. There is  no left ventricular hypertrophy. Left ventricular diastolic parameters are consistent with Grade I diastolic dysfunction (impaired relaxation). Right Ventricle: The right ventricular size is normal. No increase in right ventricular wall thickness. Right ventricular systolic function is normal. Tricuspid regurgitation signal is inadequate for assessing PA pressure. Left Atrium: Left atrial size was normal in size. Right Atrium: Right atrial size was normal in size. Pericardium: There is no  evidence of pericardial effusion. Mitral Valve: The mitral valve is normal in structure. No evidence of mitral valve regurgitation. No evidence of mitral valve stenosis. Tricuspid Valve: The tricuspid valve is normal in structure. Tricuspid valve regurgitation is not demonstrated. Aortic Valve: The aortic valve is tricuspid. Aortic valve regurgitation is not visualized. No aortic stenosis is present. Aortic valve mean gradient measures 6.0 mmHg. Aortic valve peak gradient measures 13.0 mmHg. Aortic valve area, by VTI measures 1.61  cm. Pulmonic Valve: The pulmonic valve was not well visualized. Pulmonic valve regurgitation is not visualized. Aorta: The aortic root is normal in size and structure. Venous: The inferior vena cava was not well visualized. IAS/Shunts: No atrial level shunt detected by color flow Doppler.  LEFT VENTRICLE PLAX 2D LVIDd:         3.60 cm   Diastology LVIDs:         2.00 cm   LV e' medial:    7.94 cm/s LV PW:         1.10 cm   LV E/e' medial:  11.5 LV IVS:        1.00 cm   LV e' lateral:   9.14 cm/s LVOT diam:     1.80 cm   LV E/e' lateral: 10.0 LV SV:         44 LV SV Index:   22 LVOT Area:     2.54 cm  LEFT ATRIUM             Index        RIGHT ATRIUM           Index LA diam:        3.20 cm 1.60 cm/m   RA Area:     12.80 cm LA Vol (A2C):   34.3 ml 17.13 ml/m  RA Volume:   30.60 ml  15.28 ml/m  LA Vol (A4C):   49.9 ml 24.92 ml/m LA Biplane Vol: 42.1 ml 21.03 ml/m  AORTIC VALVE AV Area (Vmax):    1.60 cm AV Area (Vmean):   1.59 cm AV Area (VTI):     1.61 cm AV Vmax:           180.00 cm/s AV Vmean:          115.000 cm/s AV VTI:            0.272 m AV Peak Grad:      13.0 mmHg AV Mean Grad:      6.0 mmHg LVOT Vmax:         113.00 cm/s LVOT Vmean:        72.000 cm/s LVOT VTI:          0.172 m LVOT/AV VTI ratio: 0.63  AORTA Ao Root diam: 2.60 cm Ao Asc diam:  2.80 cm MITRAL VALVE MV Area (PHT): 5.62 cm    SHUNTS MV Decel Time: 135 msec    Systemic VTI:  0.17 m MV E velocity: 91.30 cm/s  Systemic Diam: 1.80 cm MV A velocity: 99.00 cm/s MV E/A ratio:  0.92 Dalton McleanMD Electronically signed by Franki Monte Signature Date/Time: 03/20/2021/6:54:39 PM    Final     Microbiology: Recent Results (from the past 240 hour(s))  Resp Panel by RT-PCR (Flu A&B, Covid) Nasopharyngeal Swab     Status: None   Collection Time: 03/19/21 10:08 AM   Specimen: Nasopharyngeal Swab; Nasopharyngeal(NP) swabs in vial transport medium  Result Value Ref Range Status   SARS Coronavirus 2 by RT PCR NEGATIVE  NEGATIVE Final    Comment: (NOTE) SARS-CoV-2 target nucleic acids are NOT DETECTED.  The SARS-CoV-2 RNA is generally detectable in upper respiratory specimens during the acute phase of infection. The lowest concentration of SARS-CoV-2 viral copies this assay can detect is 138 copies/mL. A negative result does not preclude SARS-Cov-2 infection and should not be used as the sole basis for treatment or other patient management decisions. A negative result may occur with  improper specimen collection/handling, submission of specimen other than nasopharyngeal swab, presence of viral mutation(s) within the areas targeted by this assay, and inadequate number of viral copies(<138 copies/mL). A negative result must be combined with clinical observations, patient history, and epidemiological information. The  expected result is Negative.  Fact Sheet for Patients:  EntrepreneurPulse.com.au  Fact Sheet for Healthcare Providers:  IncredibleEmployment.be  This test is no t yet approved or cleared by the [place] FDA and  has been authorized for detection and/or diagnosis of SARS-CoV-2 by FDA under an Emergency Use Authorization (EUA). This EUA will remain  in effect (meaning this test can be used) for the duration of the COVID-19 declaration under Section 564(b)(1) of the Act, 21 U.S.C.section 360bbb-3(b)(1), unless the authorization is terminated  or revoked sooner.       Influenza A by PCR NEGATIVE NEGATIVE Final   Influenza B by PCR NEGATIVE NEGATIVE Final    Comment: (NOTE) The Xpert Xpress SARS-CoV-2/FLU/RSV plus assay is intended as an aid in the diagnosis of influenza from Nasopharyngeal swab specimens and should not be used as a sole basis for treatment. Nasal washings and aspirates are unacceptable for Xpert Xpress SARS-CoV-2/FLU/RSV testing.  Fact Sheet for Patients: EntrepreneurPulse.com.au  Fact Sheet for Healthcare Providers: IncredibleEmployment.be  This test is not yet approved or cleared by the [place] FDA and has been authorized for detection and/or diagnosis of SARS-CoV-2 by FDA under an Emergency Use Authorization (EUA). This EUA will remain in effect (meaning this test can be used) for the duration of the COVID-19 declaration under Section 564(b)(1) of the Act, 21 U.S.C. section 360bbb-3(b)(1), unless the authorization is terminated or revoked.  Performed at [facility], [place] [address], [place], [place] [address]      Labs: Basic Metabolic Panel: Recent Labs  Lab 03/19/21 1004 03/19/21 1025 03/19/21 1404 03/20/21 0444 03/20/21 0806 03/21/21 0341  NA 131* 135 134* 132* 134* 133*  K 4.0 3.9 3.6 3.6 3.4* 3.1*  CL 105  --  110 105 105 102  CO2 9*  --  11* 13* 16*  17*  GLUCOSE 331*  --  134* 276* 289* 285*  BUN 11  --  $R'8 8 6 10  'TD$ CREATININE 1.21*  --  1.02* 1.16* 0.95 0.88  CALCIUM 9.0  --  8.2* 8.8* 8.5* 8.9   Liver Function Tests: Recent Labs  Lab 03/18/21 1219  AST 20  ALT 31  ALKPHOS 101  BILITOT 1.7*  PROT 8.8*  ALBUMIN 4.6   No results for input(s): LIPASE, AMYLASE in the last 168 hours. No results for input(s): AMMONIA in the last 168 hours. CBC: Recent Labs  Lab 03/18/21 1219 03/19/21 1004 03/19/21 1025 03/21/21 0341  WBC 11.7* 10.9*  --  7.8  NEUTROABS 8.3* 7.2  --   --   HGB 14.0 13.2 14.3 11.2*  HCT 43.4 40.3 42.0 34.0*  MCV 96.7 97.1  --  95.2  PLT 390 328  --  315   Cardiac Enzymes: No results for input(s): CKTOTAL, CKMB, CKMBINDEX, TROPONINI  in the last 168 hours. BNP: BNP (last 3 results) No results for input(s): BNP in the last 8760 hours.  ProBNP (last 3 results) No results for input(s): PROBNP in the last 8760 hours.  CBG: Recent Labs  Lab 03/20/21 1157 03/20/21 1620 03/20/21 2036 03/21/21 0639 03/21/21 1112  GLUCAP 278* 302* 223* 93 394*       Signed:  Domenic Polite MD.  [facility] 03/21/2021, 1:49 PM

## 2021-03-21 NOTE — Progress Notes (Signed)
DISCHARGE NOTE HOME Tracy Johns to be discharged Home per MD order. Discussed prescriptions and follow up appointments with the patient. Prescriptions given to patient; medication list explained in detail. Patient verbalized understanding.  Skin clean, dry and intact without evidence of skin break down, no evidence of skin tears noted. IV catheter discontinued intact. Site without signs and symptoms of complications. Dressing and pressure applied. Pt denies pain at the site currently. No complaints noted.  Patient free of lines, drains, and wounds.   An After Visit Summary (AVS) was printed and given to the patient. Patient escorted via wheelchair, and discharged home via private auto.  Dolores Hoose, RN

## 2021-03-21 NOTE — Plan of Care (Signed)

## 2021-03-21 NOTE — TOC Benefit Eligibility Note (Signed)
Transition of Care [facility]) Benefit Eligibility Note    Patient Details  Name: Tracy Johns MRN: [id number] Date of Birth: Mar 26, 1976   Medication/Dose: [name] FLEX PEN  Covered?: Yes  Tier:  (PREFERRED)  Prescription Coverage Preferred Pharmacy: Roseanne Kaufman with Person/Company/Phone Number:: [facility]  @ [facility] # [phone]  Co-Pay: $10.00  Prior Approval: No  Deductible: Unmet (OUT-OF-POCKET:UNMET)  Additional Notes: DETEMIR :  [name] Phone Number: 03/21/2021, 1:01 PM

## 2021-03-21 NOTE — TOC Transition Note (Signed)
Transition of Care [facility]) - CM/SW Discharge Note   Patient Details  Name: Tracy Johns MRN: [id number] Date of Birth: 1976-02-18  [facility] CM/SW Contact:  Tom-Johnson, Renea Ee, RN Phone Number: 03/21/2021, 12:36 PM   Clinical Narrative:    Patient is scheduled for discharge today. Diabetic coordinator did education with patient as a new diabetic. Patient to start on Levemir at home. Patient has [facility] insurance and will pay co pay. Husband to transport at discharge. No further TOC needs noted.      Barriers to Discharge: Barriers Resolved   Patient Goals and CMS Choice Patient states their goals for this hospitalization and ongoing recovery are:: To go home CMS Medicare.gov Compare Post Acute Care list provided to:: Patient Choice offered to / list presented to : NA  Discharge Placement                       Discharge Plan and Services   Discharge Planning Services: CM Consult            DME Arranged: N/A DME Agency: NA       HH Arranged: NA HH Agency: NA        Social Determinants of Health (SDOH) Interventions     Readmission Risk Interventions No flowsheet data found.

## 2021-03-25 ENCOUNTER — Telehealth: Payer: Self-pay | Admitting: Family Medicine

## 2021-03-25 NOTE — Telephone Encounter (Signed)
Pt is having questions concerning her Levemir and her Metformin. Please advise pt at [phone].

## 2021-03-26 ENCOUNTER — Telehealth: Payer: Self-pay

## 2021-03-26 NOTE — Telephone Encounter (Signed)
Transition Care Management Follow-up Telephone Call Date of discharge and from where: 03/21/2021  from [facility] How have you been since you were released from the hospital? "Doing pretty ok, reports some blurry vision" Any questions or concerns? Yes  Items Reviewed: Did the pt receive and understand the discharge instructions provided? Yes  Medications obtained and verified? Yes  Other? Yes  Any new allergies since your discharge? No  Dietary orders reviewed? Yes Do you have support at home? Yes   Home Care and Equipment/Supplies: Were home health services ordered? no If so, what is the name of the agency? Not a  Has the agency set up a time to come to the patient's home? no Were any new equipment or medical supplies ordered?  Yes: diabetic supplies  received. What is the name of the medical supply agency? Not applicable Were you able to get the supplies/equipment? yes Do you have any questions related to the use of the equipment or supplies? No  Functional Questionnaire: (I = Independent and D = Dependent) ADLs: I  Bathing/Dressing- I  Meal Prep- I  Eating- I  Maintaining continence- I  Transferring/Ambulation- I  Managing Meds- I  Follow up appointments reviewed:  PCP Hospital f/u appt confirmed? Yes  Scheduled to see Dr. Ethelene Hal on 04/03/21 @ 11:15. [facility] f/u appt confirmed? N/a   Are transportation arrangements needed? No  If their condition worsens, is the pt aware to call PCP or go to the Emergency Dept.? Yes Was the patient provided with contact information for the PCP's office or ED? Yes Was to pt encouraged to call back with questions or concerns? Yes     Thea Silversmith, RN, MSN, BSN, [place] Care Management Coordinator [phone]

## 2021-03-27 NOTE — Telephone Encounter (Signed)
Patient aware of recommendations and will follow.

## 2021-03-27 NOTE — Telephone Encounter (Signed)
Spoke with patient who states that she feels like her diabetes medicine is not helping teter her glucose levels, per patient her levels are staying between 270 and 382 she have not gone beyond 400 but feels like she not going below 270. Patient has a upcoming appointment on 04/03/21 not sure if Dr. Ethelene Hal would like to change meds to help. Please advise.

## 2021-03-28 ENCOUNTER — Encounter: Payer: Self-pay | Admitting: Family Medicine

## 2021-04-01 NOTE — Telephone Encounter (Signed)
Patient just recently had to increase Levemir to 4U every other day per patient she needs a refill on this due to increase. She also states that Metformin is making her go to the bathroom more than normal. Please advise.

## 2021-04-03 ENCOUNTER — Encounter: Payer: Self-pay | Admitting: Family Medicine

## 2021-04-03 ENCOUNTER — Ambulatory Visit (INDEPENDENT_AMBULATORY_CARE_PROVIDER_SITE_OTHER): Payer: 59 | Admitting: Family Medicine

## 2021-04-03 ENCOUNTER — Other Ambulatory Visit: Payer: Self-pay

## 2021-04-03 VITALS — BP 124/72 | HR 73 | Temp 97.5°F | Ht 62.0 in | Wt 213.0 lb

## 2021-04-03 DIAGNOSIS — E1165 Type 2 diabetes mellitus with hyperglycemia: Secondary | ICD-10-CM | POA: Diagnosis not present

## 2021-04-03 DIAGNOSIS — Z794 Long term (current) use of insulin: Secondary | ICD-10-CM | POA: Diagnosis not present

## 2021-04-03 MED ORDER — RYBELSUS 3 MG PO TABS: 3.0000 mg | ORAL_TABLET | Freq: Every day | ORAL | 3 refills | Status: DC

## 2021-04-03 MED ORDER — METFORMIN HCL ER 750 MG PO TB24: 750.0000 mg | ORAL_TABLET | Freq: Every day | ORAL | 3 refills | Status: DC

## 2021-04-03 NOTE — Progress Notes (Signed)
Established Patient Office Visit  Subjective:  Patient ID: Tracy Johns, female    DOB: [birth year]  Age: 45 y.o. MRN: [id number]  CC:  Chief Complaint  Patient presents with   Hospitalization Follow-up    Hospital follow up, patient would like to change Metformin to something else.     HPI Tracy Johns presents for hospital discharge follow-up on 1110 after being admitted for hyperglycemia with DKA.  Proinsulin and insulin levels were in the low normal range.  She was discharged on 25 units of Levemir nightly and 500 of Glucophage twice daily.  Having some loose stool with Glucophage but otherwise tolerating it.  She had called a few days ago and reported elevated fasting blood sugars and I recommended that she titrate her Levemir upwards by 4 units every 2 to 3 day until fasting sugars were in the less than 150 range.  She is currently at 37 units.  Her husband is a type I diabetic.  She is scheduled to see a dietitian in the next few days.  She has reduced her carbohydrate intake in her diet.  Past Medical History:  Diagnosis Date   Dyspnea on exertion    History of acute pyelonephritis 06/26/2017   w/ sepsis   History of kidney stones    per pt 2008 and passed spontenaously   History of pseudoseizure    07-07-2017 per had first episode seizure activity that told to her caused by extreme stress in 2012, 2015 and last one 2017;  per pt was told no medication needed   Hypercholesterolemia    Hyperprolactinemia [facility]) dx 2012   endocrinologist-  dr Elvera Lennox   Hypertension    Migraines    Pituitary microadenoma [facility])    dx per MRI 06/ 2018   Wears glasses     Past Surgical History:  Procedure Laterality Date   CESAREAN SECTION  x2  last one 2000   Bilateral Tubal Ligation w/ last c/s   CYSTOSCOPY/RETROGRADE/URETEROSCOPY Right 06/26/2017   Procedure: CYSTOSCOPY RIGHT RETROGRADE RIGHT CYSTOSCOPY;  Surgeon: Jerilee Field, MD;  Location: [facility];  Service:  Urology;  Laterality: Right;   CYSTOSCOPY/URETEROSCOPY/HOLMIUM LASER/STENT PLACEMENT Right 07/10/2017   Procedure: CYSTOSCOPY/URETEROSCOPY/HOLMIUM LASER/STENT EXCHANGE;  Surgeon: Jerilee Field, MD;  Location: [facility];  Service: Urology;  Laterality: Right;    Family History  Problem Relation Age of Onset   Hypertension Other    Stroke Other    Heart failure Other    Heart attack Mother    Stroke Father     Social History   Socioeconomic History   Marital status: Married    Spouse name: Not on file   Number of children: Not on file   Years of education: Not on file   Highest education level: Not on file  Occupational History   Not on file  Tobacco Use   Smoking status: Every Day    Packs/day: 0.50    Years: 6.00    Pack years: 3.00    Types: Cigarettes   Smokeless tobacco: Never  Vaping Use   Vaping Use: Never used  Substance and Sexual Activity   Alcohol use: Yes    Comment: occa   Drug use: No   Sexual activity: Yes  Other Topics Concern   Not on file  Social History Narrative   Not on file   Social Determinants of Health   Financial Resource Strain: Not on file  Food Insecurity: Not on file  Transportation Needs: Not on  file  Physical Activity: Not on file  Stress: Not on file  Social Connections: Not on file  Intimate Partner Violence: Not on file    Outpatient Medications Prior to Visit  Medication Sig Dispense Refill   acetaminophen (TYLENOL) 500 MG tablet Take 500 mg by mouth every 6 (six) hours as needed.     albuterol (VENTOLIN HFA) 108 (90 Base) MCG/ACT inhaler INHALE 1 TO 2 PUFFS INTO THE LUNGS EVERY 6 HOURS AS NEEDED FOR WHEEZING OR SHORTNESS OF BREATH (Patient taking differently: Inhale 1-2 puffs into the lungs every 6 (six) hours as needed.) 8.5 g 0   aspirin-acetaminophen-caffeine (EXCEDRIN MIGRAINE) 250-250-65 MG tablet Take by mouth every 6 (six) hours as needed for headache.     blood glucose meter kit and supplies KIT  Dispense based on patient and insurance preference. Use up to four times daily as directed. 1 each 0   FLUoxetine (PROZAC) 40 MG capsule Take 1 capsule (40 mg total) by mouth daily. 90 capsule 3   hydrochlorothiazide (HYDRODIURIL) 25 MG tablet Take 1 tablet (25 mg total) by mouth daily. 90 tablet 3   insulin detemir (LEVEMIR) 100 UNIT/ML FlexPen Inject 25 Units into the skin daily. 15 mL 0   Insulin Pen Needle (PEN NEEDLES 3/16") 31G X 5 MM MISC 25 Units by Does not apply route daily. 30 each 1   methocarbamol (ROBAXIN) 500 MG tablet Take 1 tablet (500 mg total) by mouth every 8 (eight) hours as needed for muscle spasms. 90 tablet 0   telmisartan (MICARDIS) 40 MG tablet TAKE 1 TABLET(40 MG) BY MOUTH DAILY (Patient taking differently: Take 40 mg by mouth daily.) 90 tablet 1   metFORMIN (GLUCOPHAGE) 500 MG tablet Take 1 tablet (500 mg total) by mouth 2 (two) times daily with a meal. 60 tablet 0   No facility-administered medications prior to visit.    Allergies  Allergen Reactions   Imitrex [Sumatriptan] Nausea And Vomiting   Oxycodone Itching    Percocet "feel extremely itching and skin burns"   Percocet [Oxycodone-Acetaminophen]     itching    ROS Review of Systems  Constitutional:  Negative for diaphoresis, fatigue, fever and unexpected weight change.  HENT: Negative.    Respiratory: Negative.    Cardiovascular: Negative.   Gastrointestinal: Negative.   Endocrine: Negative for polyphagia and polyuria.  Psychiatric/Behavioral: Negative.       Objective:    Physical Exam Vitals and nursing note reviewed.  Constitutional:      General: She is not in acute distress.    Appearance: Normal appearance. She is not ill-appearing, toxic-appearing or diaphoretic.  HENT:     Head: Normocephalic and atraumatic.     Right Ear: External ear normal.     Left Ear: External ear normal.     Mouth/Throat:     Pharynx: No oropharyngeal exudate or posterior oropharyngeal erythema.  Eyes:      General: No scleral icterus.       Right eye: No discharge.        Left eye: No discharge.     Extraocular Movements: Extraocular movements intact.     Conjunctiva/sclera: Conjunctivae normal.  Cardiovascular:     Rate and Rhythm: Normal rate and regular rhythm.  Pulmonary:     Effort: Pulmonary effort is normal.     Breath sounds: Normal breath sounds.  Abdominal:     General: Bowel sounds are normal.  Skin:    General: Skin is warm and dry.  Neurological:  Mental Status: She is oriented to person, place, and time.  Psychiatric:        Mood and Affect: Mood normal.        Behavior: Behavior normal.    BP 124/72 (BP Location: Right Arm, Patient Position: Sitting, Cuff Size: Normal)   Pulse 73   Temp (!) 97.5 F (36.4 C) (Temporal)   Ht $R'5\' 2"'Di$  (1.575 m)   Wt 213 lb (96.6 kg)   SpO2 93%   BMI 38.96 kg/m  Wt Readings from Last 3 Encounters:  04/03/21 213 lb (96.6 kg)  03/19/21 223 lb (101.2 kg)  07/26/20 223 lb 12.8 oz (101.5 kg)     Health Maintenance Due  Topic Date Due   Pneumococcal Vaccine 16-62 Years old (1 - PCV) Never done   FOOT EXAM  Never done   OPHTHALMOLOGY EXAM  Never done   Hepatitis C Screening  Never done   TETANUS/TDAP  Never done   PAP SMEAR-Modifier  Never done   COLONOSCOPY (Pts 45-31yrs Insurance coverage will need to be confirmed)  Never done    There are no preventive care reminders to display for this patient.  Lab Results  Component Value Date   TSH 2.10 10/06/2017   Lab Results  Component Value Date   WBC 7.8 03/21/2021   HGB 11.2 (L) 03/21/2021   HCT 34.0 (L) 03/21/2021   MCV 95.2 03/21/2021   PLT 315 03/21/2021   Lab Results  Component Value Date   NA 133 (L) 03/21/2021   K 3.1 (L) 03/21/2021   CO2 17 (L) 03/21/2021   GLUCOSE 285 (H) 03/21/2021   BUN 10 03/21/2021   CREATININE 0.88 03/21/2021   BILITOT 1.7 (H) 03/18/2021   ALKPHOS 101 03/18/2021   AST 20 03/18/2021   ALT 31 03/18/2021   PROT 8.8 (H) 03/18/2021    ALBUMIN 4.6 03/18/2021   CALCIUM 8.9 03/21/2021   ANIONGAP 14 03/21/2021   GFR 88.90 07/26/2020   Lab Results  Component Value Date   CHOL 241 (H) 03/19/2021   Lab Results  Component Value Date   HDL 24 (L) 03/19/2021   Lab Results  Component Value Date   LDLCALC 179 (H) 03/19/2021   Lab Results  Component Value Date   TRIG 190 (H) 03/19/2021   Lab Results  Component Value Date   CHOLHDL 10.0 03/19/2021   Lab Results  Component Value Date   HGBA1C 12.0 (H) 03/18/2021      Assessment & Plan:   Problem List Items Addressed This Visit   None Visit Diagnoses     Type 2 diabetes mellitus with hyperglycemia, with long-term current use of insulin (HCC)    -  Primary   Relevant Medications   metFORMIN (GLUCOPHAGE XR) 750 MG 24 hr tablet   Semaglutide (RYBELSUS) 3 MG TABS       Meds ordered this encounter  Medications   metFORMIN (GLUCOPHAGE XR) 750 MG 24 hr tablet    Sig: Take 1 tablet (750 mg total) by mouth daily with breakfast.    Dispense:  60 tablet    Refill:  3   Semaglutide (RYBELSUS) 3 MG TABS    Sig: Take 3 mg by mouth daily.    Dispense:  30 tablet    Refill:  3    <SAMPLE>    Follow-up: Return in about 5 weeks (around 05/08/2021), or Continue Levimir titration as discussed..  Continue Levemir titration every 2 to 3 days until fasting sugars are under 150  but ideally under 130.  Hold or decrease dosage for fasting sugars under 90.  Projected dose of 60 to 70 units.  Have increased metformin to 750 mg twice daily.  Have also changed to extended release form to relieve side effects.  We will start semaglutide tablets daily.  Discussed side effects.  Information was given on all 3 of these medications.  Libby Maw, MD

## 2021-04-05 ENCOUNTER — Encounter: Payer: Self-pay | Admitting: Family Medicine

## 2021-04-10 ENCOUNTER — Encounter: Payer: Self-pay | Admitting: Family Medicine

## 2021-04-11 NOTE — Telephone Encounter (Signed)
Patient calling states that she's taking Monistat 7 for yeast infection it helps for a little while but the more she take the Metformin she feels as if this is causing yeast. Patient also states that her glucose levels are still very high she have not started the Rybelsus her pharmacy does not have this yet. She would like to know if she could start on Humalog to see if this will control her sugar levels or what else can she do? Please advise.

## 2021-04-12 ENCOUNTER — Other Ambulatory Visit: Payer: Self-pay

## 2021-04-12 DIAGNOSIS — E1165 Type 2 diabetes mellitus with hyperglycemia: Secondary | ICD-10-CM

## 2021-04-12 MED ORDER — RYBELSUS 3 MG PO TABS: 3.0000 mg | ORAL_TABLET | Freq: Every day | ORAL | 3 refills | Status: DC

## 2021-04-19 ENCOUNTER — Encounter: Payer: Self-pay | Admitting: Family Medicine

## 2021-04-19 DIAGNOSIS — Z794 Long term (current) use of insulin: Secondary | ICD-10-CM

## 2021-04-19 DIAGNOSIS — E1165 Type 2 diabetes mellitus with hyperglycemia: Secondary | ICD-10-CM

## 2021-04-19 MED ORDER — INSULIN DETEMIR 100 UNIT/ML FLEXPEN
25.0000 [IU] | PEN_INJECTOR | Freq: Every day | SUBCUTANEOUS | 0 refills | Status: DC
Start: 2021-04-19 — End: 2021-10-15

## 2021-04-19 MED ORDER — "PEN NEEDLES 3/16"" 31G X 5 MM MISC": 25.0000 [IU] | Freq: Every day | 1 refills | Status: DC

## 2021-04-24 ENCOUNTER — Encounter: Payer: Self-pay | Admitting: Family Medicine

## 2021-04-24 DIAGNOSIS — E1165 Type 2 diabetes mellitus with hyperglycemia: Secondary | ICD-10-CM

## 2021-04-26 NOTE — Telephone Encounter (Signed)
Pt said she missed call and just asked for a call back

## 2021-04-26 NOTE — Telephone Encounter (Signed)
Lft VM to rtn call. Dm/cma  

## 2021-04-26 NOTE — Telephone Encounter (Signed)
Patient notified [place] phone.   She wasn't happy and will discuss it at upcoming appointment on 05/21/21. No further questions. Dm/cma

## 2021-05-21 ENCOUNTER — Encounter: Payer: Self-pay | Admitting: Family Medicine

## 2021-05-21 ENCOUNTER — Ambulatory Visit: Payer: 59 | Admitting: Family Medicine

## 2021-05-21 ENCOUNTER — Telehealth: Payer: Self-pay | Admitting: Family Medicine

## 2021-05-21 NOTE — Telephone Encounter (Signed)
2nd no show, $50 fee generated, letter sent via MyChart and mail 08/01/2019 & 05/21/2021

## 2021-05-27 ENCOUNTER — Ambulatory Visit: Payer: 59 | Admitting: Skilled Nursing Facility1

## 2021-05-28 ENCOUNTER — Other Ambulatory Visit: Payer: Self-pay | Admitting: Family Medicine

## 2021-05-28 DIAGNOSIS — M545 Low back pain, unspecified: Secondary | ICD-10-CM

## 2021-05-28 MED ORDER — METHOCARBAMOL 500 MG PO TABS: 500.0000 mg | ORAL_TABLET | Freq: Three times a day (TID) | ORAL | 0 refills | Status: DC | PRN

## 2021-08-13 ENCOUNTER — Other Ambulatory Visit: Payer: Self-pay | Admitting: Family Medicine

## 2021-08-13 DIAGNOSIS — F418 Other specified anxiety disorders: Secondary | ICD-10-CM

## 2021-08-13 DIAGNOSIS — I10 Essential (primary) hypertension: Secondary | ICD-10-CM

## 2021-09-09 ENCOUNTER — Other Ambulatory Visit: Payer: Self-pay | Admitting: Family Medicine

## 2021-09-09 DIAGNOSIS — I10 Essential (primary) hypertension: Secondary | ICD-10-CM

## 2021-10-10 NOTE — Telephone Encounter (Signed)
Called patient to schedule follow up appointment no answer LMTCB to schedule

## 2021-10-15 ENCOUNTER — Encounter: Payer: Self-pay | Admitting: Family Medicine

## 2021-10-15 ENCOUNTER — Ambulatory Visit (INDEPENDENT_AMBULATORY_CARE_PROVIDER_SITE_OTHER): Payer: 59 | Admitting: Family Medicine

## 2021-10-15 VITALS — BP 134/90 | HR 100 | Ht 61.5 in | Wt 215.0 lb

## 2021-10-15 DIAGNOSIS — Z794 Long term (current) use of insulin: Secondary | ICD-10-CM

## 2021-10-15 DIAGNOSIS — E1165 Type 2 diabetes mellitus with hyperglycemia: Secondary | ICD-10-CM

## 2021-10-15 DIAGNOSIS — Z0000 Encounter for general adult medical examination without abnormal findings: Secondary | ICD-10-CM | POA: Diagnosis not present

## 2021-10-15 DIAGNOSIS — F418 Other specified anxiety disorders: Secondary | ICD-10-CM | POA: Diagnosis not present

## 2021-10-15 DIAGNOSIS — I10 Essential (primary) hypertension: Secondary | ICD-10-CM | POA: Diagnosis not present

## 2021-10-15 LAB — POCT GLYCOSYLATED HEMOGLOBIN (HGB A1C): HbA1c, POC (controlled diabetic range): 10.1 % — AB (ref 0.0–7.0)

## 2021-10-15 LAB — GLUCOSE, POCT (MANUAL RESULT ENTRY): POC Glucose: 442 mg/dl — AB (ref 70–99)

## 2021-10-15 MED ORDER — FLUOXETINE HCL 40 MG PO CAPS: 40.0000 mg | ORAL_CAPSULE | Freq: Every day | ORAL | 3 refills | Status: DC

## 2021-10-15 MED ORDER — HYDROCHLOROTHIAZIDE 25 MG PO TABS: ORAL_TABLET | ORAL | 1 refills | Status: DC

## 2021-10-15 MED ORDER — ACCU-CHEK FASTCLIX LANCETS MISC: 1.0000 | Freq: Every day | 1 refills | Status: DC

## 2021-10-15 MED ORDER — BLOOD GLUCOSE MONITOR KIT: PACK | 0 refills | Status: AC

## 2021-10-15 MED ORDER — TRESIBA FLEXTOUCH 100 UNIT/ML ~~LOC~~ SOPN: 10.0000 [IU] | PEN_INJECTOR | Freq: Every day | SUBCUTANEOUS | 1 refills | Status: DC

## 2021-10-15 MED ORDER — ACCU-CHEK FASTCLIX LANCET KIT: 1.0000 | PACK | Freq: Every day | 1 refills | Status: DC

## 2021-10-15 MED ORDER — "PEN NEEDLES 3/16"" 31G X 5 MM MISC": 25.0000 [IU] | Freq: Every day | 1 refills | Status: DC

## 2021-10-15 NOTE — Patient Instructions (Addendum)
I have sent in refills for your medications. We also got labs today to check your kidneys and cholesterol.   For your diabetes, your A1c was 10.1. I want to start you on a different insulin called Tyler Aas (it lasts a bit longer than other insulins). You will start with 10 Units daily. Check your fasting sugars in the morning and if your sugar is >150, increase your dose by 2 Units the next time you take the insulin.    We will have several things to talk about at your next visit as well to make sure we are covering everything we need to.

## 2021-10-15 NOTE — Assessment & Plan Note (Signed)
Prozac refilled today. Instructed patient to start taking '20mg'$  daily and then we can increase to the '40mg'$  she was previously taking.

## 2021-10-15 NOTE — Assessment & Plan Note (Signed)
BP 134/90 in office. On Telmisartan, needing refill of HCTZ. - Continue Telmisartan '40mg'$  daily - refilled HCTZ '25mg'$  daily - Closely monitor for hypotensive episodes

## 2021-10-15 NOTE — Progress Notes (Signed)
Subjective:    Patient ID: Tracy Johns, female    DOB: [birth year], 46 y.o.   MRN: [id number]   CC: New Patient  HPI:  Patient reports that she has been out of some of her medications for a few months due to issues with insurance and changing physicians.   - T2DM: patient previously on metformin, but reports that she did not tolerate it well and does not want to try it again. Previously on Levemir but was unable to afford this when she had briefly lost insurance coverage. Has checked her sugars at home intermittently as her husband has T1DM and has been in the 400s a few times. Was in the hospital in Nov 2022 due to DKA but has not had any symptoms of that recently.  - HTN: needing refill of HCTZ, has been able to take her Telmisartan during this time.   - Depression/Anxiety: has been out of her Prozac for 2.5 months and would like to restart the medication  PMHx: Past Medical History:  Diagnosis Date   DKA (diabetic ketoacidosis) ([facility]) 03/19/2021   Dyspnea on exertion    History of acute pyelonephritis 06/26/2017   w/ sepsis   History of kidney stones    per pt 2008 and passed spontenaously   History of pseudoseizure    07-07-2017 per had first episode seizure activity that told to her caused by extreme stress in 2012, 2015 and last one 2017;  per pt was told no medication needed   Hypercholesterolemia    Hyperprolactinemia [facility]) dx 2012   endocrinologist-  dr Cruzita Lederer   Hypertension    Migraines    Pituitary microadenoma [facility])    dx per MRI 06/ 2018   Wears glasses      Surgical Hx: Past Surgical History:  Procedure Laterality Date   CESAREAN SECTION  x2  last one 2000   Bilateral Tubal Ligation w/ last c/s   CYSTOSCOPY/RETROGRADE/URETEROSCOPY Right 06/26/2017   Procedure: CYSTOSCOPY RIGHT RETROGRADE RIGHT CYSTOSCOPY;  Surgeon: Festus Aloe, MD;  Location: [facility];  Service: Urology;  Laterality: Right;   CYSTOSCOPY/URETEROSCOPY/HOLMIUM LASER/STENT  PLACEMENT Right 07/10/2017   Procedure: CYSTOSCOPY/URETEROSCOPY/HOLMIUM LASER/STENT EXCHANGE;  Surgeon: Festus Aloe, MD;  Location: [facility];  Service: Urology;  Laterality: Right;     Family Hx: Family History  Problem Relation Age of Onset   Hypertension Other    Stroke Other    Heart failure Other    Heart attack Mother    Stroke Father      Social Hx: Current Social History  10/15/2021   Who lives at home: Husband  Who would speak for you about health care matters: Husband  Transportation: Drives a car  Important Relationships & Pets: 1 dog  Current Stressors: health  Religious / Personal Beliefs: Christian, nothing that affects healthcare  Tobacco: smokes cigarettes Denies alcohol use   Medications: Currently: Telmisartan '40mg'$  daily Previously: Metformin, Levemir 25Units daily, HCTZ '25mg'$  daily  Preventative Screening Mammogram: 2019 BI-RADS 1 Echocardiogram: 2022 with G1DD  Objective:  BP 134/90   Pulse 100   Ht 5' 1.5" (1.562 m)   Wt 215 lb (97.5 kg)   LMP 09/16/2021 Comment: tubal ligation 2000  SpO2 95%   BMI 39.97 kg/m  Vitals and nursing note reviewed  General: well nourished, in no acute distress HEENT: normocephalic, no scleral icterus or conjunctival pallor, no nasal discharge, moist mucous membranes, fair/poor dentition with several caries noted Neck: supple, full ROM Cardiac: RRR, clear S1 and S2,  no murmurs, rubs, or gallops Respiratory: clear to auscultation bilaterally, no increased work of breathing Extremities: no edema or cyanosis. Warm, well perfused.  Skin: warm and dry, no rashes noted Neuro: alert and oriented, no focal deficits  Assessment & Plan:   Essential hypertension BP 134/90 in office. On Telmisartan, needing refill of HCTZ. - Continue Telmisartan '40mg'$  daily - refilled HCTZ '25mg'$  daily - Closely monitor for hypotensive episodes  Type 2 diabetes mellitus with hyperglycemia, with long-term current use  of insulin (HCC) A1c 10.1, has been out of medications for months. Does not tolerate metformin. Proficient with insulin and has family at home able to assist. Will start with insulin today with plan to transition to GLP-1 and other controller medications for protective benefits. - Tyler Aas 10 Units daily - increase Tresiba by 2 Units daily if fasting sugars >150 - Monitor for hypoglycemic episodes - Glucometer/strips/lancets prescribed - BMP today - Lipid panel today - Follow-up in 2 weeks  Depression with anxiety Prozac refilled today. Instructed patient to start taking '20mg'$  daily and then we can increase to the '40mg'$  she was previously taking.   Healthcare maintenance Discuss at next visit: - Pap smear - Mammogram - Ophthalmology exam - Hepatitis C screening - TDAP vaccination - Consider colonoscopy    Return in about 2 weeks (around 10/29/2021) for sugar check up.   Rise Patience, DO, PGY 2

## 2021-10-15 NOTE — Assessment & Plan Note (Signed)
Discuss at next visit: - Pap smear - Mammogram - Ophthalmology exam - Hepatitis C screening - TDAP vaccination - Consider colonoscopy

## 2021-10-15 NOTE — Assessment & Plan Note (Addendum)
A1c 10.1, has been out of medications for months. Does not tolerate metformin. Proficient with insulin and has family at home able to assist. Will start with insulin today with plan to transition to GLP-1 and other controller medications for protective benefits. - Tyler Aas 10 Units daily - increase Tresiba by 2 Units daily if fasting sugars >150 - Monitor for hypoglycemic episodes - Glucometer/strips/lancets prescribed - BMP today - Lipid panel today - Follow-up in 2 weeks

## 2021-10-16 ENCOUNTER — Other Ambulatory Visit: Payer: Self-pay | Admitting: Family Medicine

## 2021-10-16 ENCOUNTER — Encounter: Payer: Self-pay | Admitting: Family Medicine

## 2021-10-16 ENCOUNTER — Telehealth: Payer: Self-pay

## 2021-10-16 LAB — BASIC METABOLIC PANEL
BUN/Creatinine Ratio: 10 (ref 9–23)
BUN: 8 mg/dL (ref 6–24)
CO2: 24 mmol/L (ref 20–29)
Calcium: 9.9 mg/dL (ref 8.7–10.2)
Chloride: 95 mmol/L — ABNORMAL LOW (ref 96–106)
Creatinine, Ser: 0.83 mg/dL (ref 0.57–1.00)
Glucose: 409 mg/dL — ABNORMAL HIGH (ref 70–99)
Potassium: 3.9 mmol/L (ref 3.5–5.2)
Sodium: 135 mmol/L (ref 134–144)
eGFR: 88 mL/min/{1.73_m2} (ref >59)

## 2021-10-16 LAB — LIPID PANEL
Chol/HDL Ratio: 7.1 ratio — ABNORMAL HIGH (ref 0.0–4.4)
Cholesterol, Total: 243 mg/dL — ABNORMAL HIGH (ref 100–199)
HDL: 34 mg/dL — ABNORMAL LOW (ref >39)
LDL Chol Calc (NIH): 171 mg/dL — ABNORMAL HIGH (ref 0–99)
Triglycerides: 202 mg/dL — ABNORMAL HIGH (ref 0–149)
VLDL Cholesterol Cal: 38 mg/dL (ref 5–40)

## 2021-10-16 MED ORDER — ROSUVASTATIN CALCIUM 10 MG PO TABS: 10.0000 mg | ORAL_TABLET | Freq: Every day | ORAL | 3 refills | Status: DC

## 2021-10-16 MED ORDER — INSULIN GLARGINE 100 UNIT/ML SOLOSTAR PEN: 10.0000 [IU] | PEN_INJECTOR | Freq: Every day | SUBCUTANEOUS | 0 refills | Status: DC

## 2021-10-16 NOTE — Telephone Encounter (Signed)
Patient have transferred care

## 2021-10-16 NOTE — Telephone Encounter (Signed)
Patient calls nurse line in regards to results.   Patient advised of cholesterol results and the need to start cholesterol medication.   Patient reports she is fine with moving forward with medication.   Will forward to PCP.

## 2021-10-16 NOTE — Progress Notes (Signed)
Tracy Johns not covered by [employer]. Will send in Lantus with same instructions for 10 Units daily with an increase of 2 Units every day if blood sugars remain >150.    Tommey Barret, DO

## 2021-10-16 NOTE — Telephone Encounter (Signed)
Order placed for Rosuvastatin '10mg'$  daily.

## 2021-10-22 ENCOUNTER — Other Ambulatory Visit (HOSPITAL_COMMUNITY): Payer: Self-pay

## 2021-10-29 ENCOUNTER — Ambulatory Visit (INDEPENDENT_AMBULATORY_CARE_PROVIDER_SITE_OTHER): Payer: 59 | Admitting: Family Medicine

## 2021-10-29 ENCOUNTER — Encounter: Payer: Self-pay | Admitting: Family Medicine

## 2021-10-29 VITALS — BP 126/82 | HR 69 | Ht 61.5 in | Wt 210.0 lb

## 2021-10-29 DIAGNOSIS — I10 Essential (primary) hypertension: Secondary | ICD-10-CM | POA: Diagnosis not present

## 2021-10-29 DIAGNOSIS — Z794 Long term (current) use of insulin: Secondary | ICD-10-CM

## 2021-10-29 DIAGNOSIS — E1165 Type 2 diabetes mellitus with hyperglycemia: Secondary | ICD-10-CM

## 2021-10-29 MED ORDER — TRULICITY 0.75 MG/0.5ML ~~LOC~~ SOAJ: 0.7500 mg | SUBCUTANEOUS | 1 refills | Status: DC

## 2021-10-29 NOTE — Assessment & Plan Note (Signed)
BP 126/82.  On telmisartan 40 mg daily.  Unable to afford HCTZ co-pay - Continue telmisartan - Continue to monitor blood pressure at home - Bring BP cuff to next visit to check against our measurements

## 2021-10-29 NOTE — Patient Instructions (Addendum)
IfI am going to try and get you approved for the once weekly local diabetic medications.  Will likely require prior authorization it is too expensive then please let us know and we will figure out another plan. Until you are able to get it approved or start it, I want you to keep increasing your insulin by 2 units every day if blood sugars remain > 140.  Go ahead and start taking 28 units with your next dose.  When you go to the front, I want you to make an appointment with Dr. Valentina Lucks, who is a clinical pharmacist and will assist you with titrating your insulin, getting approved for better medications and to possibly get the continuous glucose monitor    Diet Recommendations for Diabetes   Starchy (carb) foods: Bread, rice, pasta, potatoes, corn, cereal, grits, crackers, bagels, muffins, all baked goods.  (Fruits, milk, and yogurt also have carbohydrate, but most of these foods will not spike your blood sugar as the starchy foods will.)  A few fruits do cause high blood sugars; use small portions of bananas (limit to 1/2 at a time), grapes, watermelon, oranges, and most tropical fruits.    Protein foods: Meat, fish, poultry, eggs, dairy foods, and beans such as pinto and kidney beans (beans also provide carbohydrate).   1. Eat at least 3 meals and 1-2 snacks per day. Never go more than 4-5 hours while awake without eating. Eat breakfast within the first hour of getting up.   2. Limit starchy foods to TWO per meal and ONE per snack. ONE portion of a starchy  food is equal to the following:   - ONE slice of bread (or its equivalent, such as half of a hamburger bun).   - 1/2 cup of a "scoopable" starchy food such as potatoes or rice.   - 15 grams of carbohydrate as shown on food label.  3. Include at every meal: a protein food, a carb food, and vegetables and/or fruit.   - Obtain twice the volume of veg's as protein or carbohydrate foods for both lunch and dinner.   - Fresh or frozen veg's are best.    - Keep frozen veg's on hand for a quick vegetable serving.

## 2021-10-29 NOTE — Assessment & Plan Note (Addendum)
Currently on 25 units Lantus.  Blood sugars remain elevated with most recent fasting in the 180s.  Feel the patient needs further assistance and she is interested in more help. - Referral to diabetic nutrition counseling - Lantus 28 units with next injection, increase by 2 units daily if fasting sugars > 140 - Monitor for hypoglycemic episodes - Will trial Trulicity if able to afford, will need prior Auth so patient is to titrate insulin until approved - Follow-up with Dr. Valentina Lucks in pharmacy clinic, patient may need prandial insulin and CGM if unable to afford other options

## 2021-10-29 NOTE — Progress Notes (Signed)
    SUBJECTIVE:   CHIEF COMPLAINT / HPI:   T2DM At last visit, patient started on Lantus 10 Units (insurance wouldn't cover Tyler Aas) with plans to increase based on fasting sugars. Patient now taking 25 Units. Had an episode where CBG was in the 600s, felt very nauseous, was having a lot of cramping in her feet that was very painful. Called the ambulance and the sugars ended up in the 400s. Yesterday random blood sugar was 230. Fasting this morning was 185. Patient is interested in a CGM is able as well.  She was unable to afford Crestor with all of her medications off co-pays as well.  HTN Patient notes that her blood pressures at home have been in the 130s/110's (do not feel that this is an accurate reading), and the clinic was normotensive.  He is currently only taking the telmisartan as she had a high co-pay for the HCTZ.  PERTINENT  PMH / PSH: Reviewed  OBJECTIVE:   BP 126/82   Pulse 69   Ht 5' 1.5" (1.562 m)   Wt 210 lb (95.3 kg)   LMP 10/20/2021 Comment: tubal ligation 2000  SpO2 99%   BMI 39.04 kg/m   General: NAD, well-appearing, well-nourished Respiratory: No respiratory distress, breathing comfortably, able to speak in full sentences Skin: warm and dry, no rashes noted on exposed skin Psych: Appropriate affect and mood  ASSESSMENT/PLAN:   Type 2 diabetes mellitus with hyperglycemia, with long-term current use of insulin (HCC) Currently on 25 units Lantus.  Blood sugars remain elevated with most recent fasting in the 180s.  Feel the patient needs further assistance and she is interested in more help. - Referral to diabetic nutrition counseling - Lantus 28 units with next injection, increase by 2 units daily if fasting sugars > 140 - Monitor for hypoglycemic episodes - Will trial Trulicity if able to afford, will need prior Auth so patient is to titrate insulin until approved - Follow-up with Dr. Valentina Lucks in pharmacy clinic, patient may need prandial insulin and CGM if  unable to afford other options  Essential hypertension BP 126/82.  On telmisartan 40 mg daily.  Unable to afford HCTZ co-pay - Continue telmisartan - Continue to monitor blood pressure at home - Bring BP cuff to next visit to check against our measurements     Rise Patience, [place]

## 2021-10-30 MED ORDER — INSULIN GLARGINE 100 UNIT/ML SOLOSTAR PEN: 10.0000 [IU] | PEN_INJECTOR | Freq: Every day | SUBCUTANEOUS | 3 refills | Status: DC

## 2021-11-03 ENCOUNTER — Emergency Department (HOSPITAL_BASED_OUTPATIENT_CLINIC_OR_DEPARTMENT_OTHER)
Admission: EM | Admit: 2021-11-03 | Discharge: 2021-11-04 | Disposition: A | Discharge status: Home / Self Care | Payer: 59 | Attending: Student | Admitting: Student

## 2021-11-03 ENCOUNTER — Other Ambulatory Visit: Payer: Self-pay

## 2021-11-03 ENCOUNTER — Encounter (HOSPITAL_BASED_OUTPATIENT_CLINIC_OR_DEPARTMENT_OTHER): Payer: Self-pay

## 2021-11-03 DIAGNOSIS — I10 Essential (primary) hypertension: Secondary | ICD-10-CM | POA: Insufficient documentation

## 2021-11-03 DIAGNOSIS — E1165 Type 2 diabetes mellitus with hyperglycemia: Secondary | ICD-10-CM | POA: Insufficient documentation

## 2021-11-03 DIAGNOSIS — F17210 Nicotine dependence, cigarettes, uncomplicated: Secondary | ICD-10-CM | POA: Insufficient documentation

## 2021-11-03 DIAGNOSIS — Z7982 Long term (current) use of aspirin: Secondary | ICD-10-CM | POA: Insufficient documentation

## 2021-11-03 DIAGNOSIS — S39012A Strain of muscle, fascia and tendon of lower back, initial encounter: Secondary | ICD-10-CM | POA: Insufficient documentation

## 2021-11-03 DIAGNOSIS — S3992XA Unspecified injury of lower back, initial encounter: Secondary | ICD-10-CM | POA: Diagnosis present

## 2021-11-03 DIAGNOSIS — X58XXXA Exposure to other specified factors, initial encounter: Secondary | ICD-10-CM | POA: Diagnosis not present

## 2021-11-03 DIAGNOSIS — Z794 Long term (current) use of insulin: Secondary | ICD-10-CM | POA: Insufficient documentation

## 2021-11-03 DIAGNOSIS — Z79899 Other long term (current) drug therapy: Secondary | ICD-10-CM | POA: Insufficient documentation

## 2021-11-03 DIAGNOSIS — R739 Hyperglycemia, unspecified: Secondary | ICD-10-CM

## 2021-11-03 DIAGNOSIS — T148XXA Other injury of unspecified body region, initial encounter: Secondary | ICD-10-CM

## 2021-11-03 LAB — CBC
HCT: 40.5 % (ref 36.0–46.0)
Hemoglobin: 13.4 g/dL (ref 12.0–15.0)
MCH: 30.6 pg (ref 26.0–34.0)
MCHC: 33.1 g/dL (ref 30.0–36.0)
MCV: 92.5 fL (ref 80.0–100.0)
Platelets: 359 10*3/uL (ref 150–400)
RBC: 4.38 MIL/uL (ref 3.87–5.11)
RDW: 11.8 % (ref 11.5–15.5)
WBC: 7.7 10*3/uL (ref 4.0–10.5)
nRBC: 0 % (ref 0.0–0.2)

## 2021-11-03 LAB — COMPREHENSIVE METABOLIC PANEL
ALT: 21 U/L (ref 0–44)
AST: 15 U/L (ref 15–41)
Albumin: 4.1 g/dL (ref 3.5–5.0)
Alkaline Phosphatase: 106 U/L (ref 38–126)
Anion gap: 12 (ref 5–15)
BUN: 13 mg/dL (ref 6–20)
CO2: 25 mmol/L (ref 22–32)
Calcium: 10.2 mg/dL (ref 8.9–10.3)
Chloride: 96 mmol/L — ABNORMAL LOW (ref 98–111)
Creatinine, Ser: 1.03 mg/dL — ABNORMAL HIGH (ref 0.44–1.00)
GFR, Estimated: >60 mL/min (ref >60)
Glucose, Bld: 540 mg/dL (ref 70–99)
Potassium: 3.9 mmol/L (ref 3.5–5.1)
Sodium: 133 mmol/L — ABNORMAL LOW (ref 135–145)
Total Bilirubin: 0.4 mg/dL (ref 0.3–1.2)
Total Protein: 7.5 g/dL (ref 6.5–8.1)

## 2021-11-03 LAB — URINALYSIS, ROUTINE W REFLEX MICROSCOPIC
Bilirubin Urine: NEGATIVE
Glucose, UA: >1000 mg/dL — AB
Ketones, ur: 40 mg/dL — AB
Leukocytes,Ua: NEGATIVE
Nitrite: NEGATIVE
Protein, ur: NEGATIVE mg/dL
Specific Gravity, Urine: 1.037 — ABNORMAL HIGH (ref 1.005–1.030)
pH: 5.5 (ref 5.0–8.0)

## 2021-11-03 LAB — LIPASE, BLOOD: Lipase: 37 U/L (ref 11–51)

## 2021-11-03 LAB — PREGNANCY, URINE: Preg Test, Ur: NEGATIVE

## 2021-11-03 LAB — CBG MONITORING, ED
Glucose-Capillary: 511 mg/dL (ref 70–99)
Glucose-Capillary: 416 mg/dL — ABNORMAL HIGH (ref 70–99)

## 2021-11-03 MED ORDER — INSULIN ASPART 100 UNIT/ML IV SOLN
10.0000 [IU] | Freq: Once | INTRAVENOUS | Status: AC
Start: 2021-11-03 — End: 2021-11-03
  Administered 2021-11-03: 10 [IU] via INTRAVENOUS

## 2021-11-03 MED ORDER — ONDANSETRON HCL 4 MG/2ML IJ SOLN
INTRAMUSCULAR | Status: AC
  Filled 2021-11-03: qty 4

## 2021-11-03 MED ORDER — LIDOCAINE 5 % EX PTCH
1.0000 | MEDICATED_PATCH | CUTANEOUS | Status: DC
  Administered 2021-11-03: 1 via TRANSDERMAL
  Filled 2021-11-03: qty 1

## 2021-11-03 MED ORDER — LACTATED RINGERS IV BOLUS
1000.0000 mL | Freq: Once | INTRAVENOUS | Status: AC
  Administered 2021-11-03: 1000 mL via INTRAVENOUS

## 2021-11-03 MED ORDER — SODIUM CHLORIDE 0.9 % IV SOLN
8.0000 mg | Freq: Once | INTRAVENOUS | Status: AC
  Administered 2021-11-03: 8 mg via INTRAVENOUS
  Filled 2021-11-03: qty 4

## 2021-11-04 MED ORDER — ONDANSETRON 4 MG PO TBDP: 4.0000 mg | ORAL_TABLET | Freq: Three times a day (TID) | ORAL | 0 refills | Status: DC | PRN

## 2021-11-04 MED ORDER — LIDOCAINE 4 % EX PTCH: 1.0000 | MEDICATED_PATCH | Freq: Two times a day (BID) | CUTANEOUS | 0 refills | Status: DC

## 2021-11-04 MED ORDER — AMOXICILLIN-POT CLAVULANATE 875-125 MG PO TABS: 1.0000 | ORAL_TABLET | Freq: Two times a day (BID) | ORAL | 0 refills | Status: DC

## 2021-11-04 NOTE — ED Provider Notes (Signed)
[facility] EMERGENCY DEPT Provider Note  CSN: [id number] Arrival date & time: 11/03/21 2047  Chief Complaint(s) Hyperglycemia  HPI Tracy Johns is a 46 y.o. female with PMH T2DM and previous DKA HTN, pituitary microadenoma who presents emergency department for evaluation of hyperglycemia nausea and back pain.  She states that she saw her primary care physician and was recently started on insulin approximately 20 days ago but her blood sugars have still been steadily climbing and were over 600 this morning.  She also endorses some mild nausea, lower abdominal pain and left-sided lower back pain.  She denies chest pain, shortness of breath, headache, fever or other systemic symptoms.  She states that she has been compliant with her insulin therapy.   Past Medical History Past Medical History:  Diagnosis Date   DKA (diabetic ketoacidosis) (HCC) 03/19/2021   Dyspnea on exertion    History of acute pyelonephritis 06/26/2017   w/ sepsis   History of kidney stones    per pt 2008 and passed spontenaously   History of pseudoseizure    07-07-2017 per had first episode seizure activity that told to her caused by extreme stress in 2012, 2015 and last one 2017;  per pt was told no medication needed   Hypercholesterolemia    Hyperprolactinemia [facility]) dx 2012   endocrinologist-  dr Elvera Lennox   Hypertension    Migraines    Pituitary microadenoma [facility])    dx per MRI 06/ 2018   Wears glasses    Patient Active Problem List   Diagnosis Date Noted   Type 2 diabetes mellitus with hyperglycemia, with long-term current use of insulin (HCC) 10/15/2021   Healthcare maintenance 10/15/2021   Hyperglycemia    Class 2 obesity due to excess calories with body mass index (BMI) of 38.0 to 38.9 in adult 03/12/2020   Elevated LDL cholesterol level 03/12/2020   Reactive airway disease 09/19/2019   Left low back pain 08/17/2019   Elevated glucose 08/17/2019   Depression with anxiety  07/04/2019   Tobacco use 07/04/2019   Abnormal thyroid function test 10/06/2017   Nephrolithiasis 06/27/2017   Hypokalemia 06/27/2017   Hyponatremia 06/27/2017   Essential hypertension 06/27/2017   Sepsis (HCC) 06/27/2017   Pyelonephritis 06/26/2017   Empty sella (HCC) 04/06/2017   Prolactinoma (HCC) 04/06/2017   High serum adrenocorticotropic hormone (ACTH) 04/06/2017   Hyperprolactinemia (HCC) 10/02/2016   Home Medication(s) Prior to Admission medications   Medication Sig Start Date End Date Taking? Authorizing Provider  lidocaine (SALONPAS PAIN RELIEVING) 4 % Place 1 patch onto the skin every 12 (twelve) hours. 11/04/21  Yes Annabell Oconnor, MD  ondansetron (ZOFRAN-ODT) 4 MG disintegrating tablet Take 1 tablet (4 mg total) by mouth every 8 (eight) hours as needed for nausea or vomiting. 11/04/21  Yes Nikkie Liming, MD  Accu-Chek FastClix Lancets MISC 1 each by Does not apply route daily. 10/15/21   Lilland, Alana, DO  acetaminophen (TYLENOL) 500 MG tablet Take 500 mg by mouth every 6 (six) hours as needed.    [provider]  albuterol (VENTOLIN HFA) 108 (90 Base) MCG/ACT inhaler INHALE 1 TO 2 PUFFS INTO THE LUNGS EVERY 6 HOURS AS NEEDED FOR WHEEZING OR SHORTNESS OF BREATH Patient taking differently: Inhale 1-2 puffs into the lungs every 6 (six) hours as needed. 10/11/19   Mliss Sax, MD  aspirin-acetaminophen-caffeine (EXCEDRIN MIGRAINE) [id number] MG tablet Take by mouth every 6 (six) hours as needed for headache.    [provider]  blood glucose meter kit  and supplies KIT Dispense based on patient and insurance preference. Use up to four times daily as directed. 10/15/21   Lilland, Alana, DO  Dulaglutide (TRULICITY) 0.75 MG/0.5ML SOPN Inject 0.75 mg into the skin once a week. 10/29/21   Lilland, Alana, DO  FLUoxetine (PROZAC) 40 MG capsule Take 1 capsule (40 mg total) by mouth daily. 10/15/21   Lilland, Alana, DO  hydrochlorothiazide (HYDRODIURIL) 25 MG tablet  TAKE 1 TABLET(25 MG) BY MOUTH DAILY 10/15/21   Lilland, Alana, DO  insulin glargine (LANTUS) 100 UNIT/ML Solostar Pen Inject 10 Units into the skin daily. Increase by 2 Units daily for fasting blood sugars >150. 10/30/21   Lilland, Alana, DO  Insulin Pen Needle (PEN NEEDLES 3/16") 31G X 5 MM MISC 25 Units by Does not apply route daily. 10/15/21   Lilland, Alana, DO  Lancets Misc. (ACCU-CHEK FASTCLIX LANCET) KIT 1 each by Does not apply route daily. 10/15/21   Lilland, Alana, DO  methocarbamol (ROBAXIN) 500 MG tablet TAKE 1 TABLET(500 MG) BY MOUTH EVERY 8 HOURS AS NEEDED FOR MUSCLE SPASMS 05/28/21   Mliss Sax, MD  rosuvastatin (CRESTOR) 10 MG tablet Take 1 tablet (10 mg total) by mouth daily. 10/16/21   Lilland, Alana, DO  telmisartan (MICARDIS) 40 MG tablet TAKE 1 TABLET(40 MG) BY MOUTH DAILY 08/14/21   Mliss Sax, MD                                                                                                                                    Past Surgical History Past Surgical History:  Procedure Laterality Date   CESAREAN SECTION  x2  last one 2000   Bilateral Tubal Ligation w/ last c/s   CYSTOSCOPY/RETROGRADE/URETEROSCOPY Right 06/26/2017   Procedure: CYSTOSCOPY RIGHT RETROGRADE RIGHT CYSTOSCOPY;  Surgeon: Jerilee Field, MD;  Location: [facility];  Service: Urology;  Laterality: Right;   CYSTOSCOPY/URETEROSCOPY/HOLMIUM LASER/STENT PLACEMENT Right 07/10/2017   Procedure: CYSTOSCOPY/URETEROSCOPY/HOLMIUM LASER/STENT EXCHANGE;  Surgeon: Jerilee Field, MD;  Location: [facility];  Service: Urology;  Laterality: Right;   Family History Family History  Problem Relation Age of Onset   Hypertension Other    Stroke Other    Heart failure Other    Heart attack Mother    Stroke Father     Social History Social History   Tobacco Use   Smoking status: Every Day    Packs/day: 0.50    Years: 6.00    Total pack years: 3.00    Types: Cigarettes    Passive  exposure: Current   Smokeless tobacco: Never  Vaping Use   Vaping Use: Never used  Substance Use Topics   Alcohol use: Yes    Comment: occa   Drug use: No   Allergies Imitrex [sumatriptan], Oxycodone, and Percocet [oxycodone-acetaminophen]  Review of Systems Review of Systems  Gastrointestinal:  Positive for abdominal pain and nausea.  Musculoskeletal:  Positive for back pain.  Physical Exam Vital Signs  I have reviewed the triage vital signs BP (!) 137/97   Pulse 67   Temp 98.3 F (36.8 C) (Oral)   Resp 16   Ht 5' 1.5" (1.562 m)   Wt 95.3 kg   LMP 10/20/2021 Comment: tubal ligation 2000  SpO2 95%   BMI 39.04 kg/m    Physical Exam Vitals and nursing note reviewed.  Constitutional:      General: She is not in acute distress.    Appearance: She is well-developed.  HENT:     Head: Normocephalic and atraumatic.  Eyes:     Conjunctiva/sclera: Conjunctivae normal.  Cardiovascular:     Rate and Rhythm: Normal rate and regular rhythm.     Heart sounds: No murmur heard. Pulmonary:     Effort: Pulmonary effort is normal. No respiratory distress.     Breath sounds: Normal breath sounds.  Abdominal:     Palpations: Abdomen is soft.     Tenderness: There is no abdominal tenderness.  Musculoskeletal:        General: Tenderness present. No swelling.     Cervical back: Neck supple.  Skin:    General: Skin is warm and dry.     Capillary Refill: Capillary refill takes less than 2 seconds.  Neurological:     Mental Status: She is alert.  Psychiatric:        Mood and Affect: Mood normal.     ED Results and Treatments Labs (all labs ordered are listed, but only abnormal results are displayed) Labs Reviewed  URINALYSIS, ROUTINE W REFLEX MICROSCOPIC - Abnormal; Notable for the following components:      Result Value   Specific Gravity, Urine 1.037 (*)    Glucose, UA >1,000 (*)    Hgb urine dipstick TRACE (*)    Ketones, ur 40 (*)    All other components within  normal limits  COMPREHENSIVE METABOLIC PANEL - Abnormal; Notable for the following components:   Sodium 133 (*)    Chloride 96 (*)    Glucose, Bld 540 (*)    Creatinine, Ser 1.03 (*)    All other components within normal limits  CBG MONITORING, ED - Abnormal; Notable for the following components:   Glucose-Capillary 511 (*)    All other components within normal limits  CBG MONITORING, ED - Abnormal; Notable for the following components:   Glucose-Capillary 416 (*)    All other components within normal limits  CBC  PREGNANCY, URINE  LIPASE, BLOOD                                                                                                                          Radiology No results found.  Pertinent labs & imaging results that were available during my care of the patient were reviewed by me and considered in my medical decision making (see MDM for details).  Medications Ordered in ED Medications  lactated ringers bolus 1,000 mL (1,000 mLs Intravenous New  Bag/Given 11/03/21 2150)  insulin aspart (novoLOG) injection 10 Units (10 Units Intravenous Given 11/03/21 2159)  ondansetron (ZOFRAN) 8 mg in sodium chloride 0.9 % 50 mL IVPB (8 mg Intravenous New Bag/Given 11/03/21 2326)                                                                                                                                     Procedures .Critical Care  Performed by: Glendora Score, MD Authorized by: Glendora Score, MD   Critical care provider statement:    Critical care time (minutes):  30   Critical care was necessary to treat or prevent imminent or life-threatening deterioration of the following conditions:  Endocrine crisis   Critical care was time spent personally by me on the following activities:  Development of treatment plan with patient or surrogate, discussions with consultants, evaluation of patient's response to treatment, examination of patient, ordering and review of laboratory studies,  ordering and review of radiographic studies, ordering and performing treatments and interventions, pulse oximetry, re-evaluation of patient's condition and review of old charts   (including critical care time)  Medical Decision Making / ED Course   This patient presents to the ED for concern of hyperglycemia, nausea, back pain, this involves an extensive number of treatment options, and is a complaint that carries with it a high risk of complications and morbidity.  The differential diagnosis includes hyperglycemia, DKA, HHS, musculoskeletal back pain, gastroparesis, UTI  MDM: Patient seen emergency department for multiple complaints as described above.  Physical exam with no appreciable abdominal tenderness to palpation but she does have left paraspinal lumbar tenderness.  Physical exam otherwise unremarkable.  Laboratory evaluation with initial glucose of 540, hyponatremia 133 but no anion gap, lipase negative, pregnancy negative, CBC unremarkable.  Urinalysis with mild ketonuria but with a bicarb of 25 and no anion gap I do not believe that the patient is in DKA at this time.  She was given 10 units of insulin and a liter of fluids and on reevaluation, sugar improving to 416.  She states that her abdominal pain is gone but she is having some mild persistent nausea and back pain but not resolved.  Thus, she was given Zofran and a lidocaine patch was placed on the low back.  On second reevaluation, patient states symptoms have completely resolved.  Patient presentation consistent with hyperglycemia that will require better outpatient control but she does not meet inpatient criteria for admission at this time and she is safe for discharge with outpatient follow-up.   Additional history obtained:  -External records from outside source obtained and reviewed including: Chart review including previous notes, labs, imaging, consultation notes   Lab Tests: -I ordered, reviewed, and interpreted labs.    The pertinent results include:   Labs Reviewed  URINALYSIS, ROUTINE W REFLEX MICROSCOPIC - Abnormal; Notable for the following components:      Result Value   Specific  Gravity, Urine 1.037 (*)    Glucose, UA >1,000 (*)    Hgb urine dipstick TRACE (*)    Ketones, ur 40 (*)    All other components within normal limits  COMPREHENSIVE METABOLIC PANEL - Abnormal; Notable for the following components:   Sodium 133 (*)    Chloride 96 (*)    Glucose, Bld 540 (*)    Creatinine, Ser 1.03 (*)    All other components within normal limits  CBG MONITORING, ED - Abnormal; Notable for the following components:   Glucose-Capillary 511 (*)    All other components within normal limits  CBG MONITORING, ED - Abnormal; Notable for the following components:   Glucose-Capillary 416 (*)    All other components within normal limits  CBC  PREGNANCY, URINE  LIPASE, BLOOD       Medicines ordered and prescription drug management: Meds ordered this encounter  Medications   lactated ringers bolus 1,000 mL   insulin aspart (novoLOG) injection 10 Units   ondansetron (ZOFRAN) 8 mg in sodium chloride 0.9 % 50 mL IVPB   DISCONTD: lidocaine (LIDODERM) 5 % 1 patch   DISCONTD: ondansetron (ZOFRAN) 4 MG/2ML injection    Gioffre, Gina: cabinet override   DISCONTD: amoxicillin-clavulanate (AUGMENTIN) 875-125 MG tablet    Sig: Take 1 tablet by mouth every 12 (twelve) hours.    Dispense:  14 tablet    Refill:  0   ondansetron (ZOFRAN-ODT) 4 MG disintegrating tablet    Sig: Take 1 tablet (4 mg total) by mouth every 8 (eight) hours as needed for nausea or vomiting.    Dispense:  20 tablet    Refill:  0   lidocaine (SALONPAS PAIN RELIEVING) 4 %    Sig: Place 1 patch onto the skin every 12 (twelve) hours.    Dispense:  15 patch    Refill:  0    -I have reviewed the patients home medicines and have made adjustments as needed  Critical interventions Glucose control, IVF    Cardiac Monitoring: The patient  was maintained on a cardiac monitor.  I personally viewed and interpreted the cardiac monitored which showed an underlying rhythm of: NSR  Social Determinants of Health:  Factors impacting patients care include: none   Reevaluation: After the interventions noted above, I reevaluated the patient and found that they have :improved  Co morbidities that complicate the patient evaluation  Past Medical History:  Diagnosis Date   DKA (diabetic ketoacidosis) (HCC) 03/19/2021   Dyspnea on exertion    History of acute pyelonephritis 06/26/2017   w/ sepsis   History of kidney stones    per pt 2008 and passed spontenaously   History of pseudoseizure    07-07-2017 per had first episode seizure activity that told to her caused by extreme stress in 2012, 2015 and last one 2017;  per pt was told no medication needed   Hypercholesterolemia    Hyperprolactinemia [facility]) dx 2012   endocrinologist-  dr Elvera Lennox   Hypertension    Migraines    Pituitary microadenoma [facility])    dx per MRI 06/ 2018   Wears glasses       Dispostion: I considered admission for this patient, but with improving glucose and symptoms resolved in the emergency department, she is safe for discharge with outpatient follow-up.  She currently does not meet inpatient criteria for admission     Final Clinical Impression(s) / ED Diagnoses Final diagnoses:  Hyperglycemia  Muscle strain     @PCDICTATION @  Glendora Score, MD 11/04/21 [phone]

## 2021-11-07 ENCOUNTER — Ambulatory Visit: Payer: 59 | Admitting: Pharmacist

## 2021-11-15 ENCOUNTER — Other Ambulatory Visit: Payer: Self-pay | Admitting: Family Medicine

## 2021-11-15 MED ORDER — INSULIN GLARGINE 100 UNIT/ML SOLOSTAR PEN: 10.0000 [IU] | PEN_INJECTOR | Freq: Every day | SUBCUTANEOUS | 3 refills | Status: DC

## 2021-11-28 ENCOUNTER — Observation Stay (HOSPITAL_COMMUNITY)
Admission: EM | Admit: 2021-11-28 | Discharge: 2021-11-28 | Discharge status: Against Medical Advice | Payer: 59 | Attending: Internal Medicine | Admitting: Internal Medicine

## 2021-11-28 ENCOUNTER — Encounter (HOSPITAL_COMMUNITY): Payer: Self-pay | Admitting: Emergency Medicine

## 2021-11-28 ENCOUNTER — Emergency Department (HOSPITAL_COMMUNITY): Payer: 59

## 2021-11-28 DIAGNOSIS — Z794 Long term (current) use of insulin: Secondary | ICD-10-CM | POA: Insufficient documentation

## 2021-11-28 DIAGNOSIS — Z7985 Long-term (current) use of injectable non-insulin antidiabetic drugs: Secondary | ICD-10-CM | POA: Diagnosis not present

## 2021-11-28 DIAGNOSIS — Z7982 Long term (current) use of aspirin: Secondary | ICD-10-CM | POA: Insufficient documentation

## 2021-11-28 DIAGNOSIS — Z79899 Other long term (current) drug therapy: Secondary | ICD-10-CM | POA: Diagnosis not present

## 2021-11-28 DIAGNOSIS — R739 Hyperglycemia, unspecified: Secondary | ICD-10-CM

## 2021-11-28 DIAGNOSIS — E1165 Type 2 diabetes mellitus with hyperglycemia: Secondary | ICD-10-CM | POA: Diagnosis present

## 2021-11-28 DIAGNOSIS — I10 Essential (primary) hypertension: Secondary | ICD-10-CM | POA: Diagnosis not present

## 2021-11-28 DIAGNOSIS — F17210 Nicotine dependence, cigarettes, uncomplicated: Secondary | ICD-10-CM | POA: Insufficient documentation

## 2021-11-28 LAB — URINALYSIS, ROUTINE W REFLEX MICROSCOPIC
Bilirubin Urine: NEGATIVE
Glucose, UA: >=500 mg/dL — AB
Hgb urine dipstick: NEGATIVE
Ketones, ur: 80 mg/dL — AB
Leukocytes,Ua: NEGATIVE
Nitrite: NEGATIVE
Protein, ur: NEGATIVE mg/dL
Specific Gravity, Urine: 1.03 (ref 1.005–1.030)
pH: 5 (ref 5.0–8.0)

## 2021-11-28 LAB — COMPREHENSIVE METABOLIC PANEL
ALT: 22 U/L (ref 0–44)
AST: 14 U/L — ABNORMAL LOW (ref 15–41)
Albumin: 4.2 g/dL (ref 3.5–5.0)
Alkaline Phosphatase: 114 U/L (ref 38–126)
Anion gap: 14 (ref 5–15)
BUN: 12 mg/dL (ref 6–20)
CO2: 20 mmol/L — ABNORMAL LOW (ref 22–32)
Calcium: 9.6 mg/dL (ref 8.9–10.3)
Chloride: 97 mmol/L — ABNORMAL LOW (ref 98–111)
Creatinine, Ser: 0.9 mg/dL (ref 0.44–1.00)
GFR, Estimated: >60 mL/min (ref >60)
Glucose, Bld: 593 mg/dL (ref 70–99)
Potassium: 4.1 mmol/L (ref 3.5–5.1)
Sodium: 131 mmol/L — ABNORMAL LOW (ref 135–145)
Total Bilirubin: 1 mg/dL (ref 0.3–1.2)
Total Protein: 8.1 g/dL (ref 6.5–8.1)

## 2021-11-28 LAB — CBC WITH DIFFERENTIAL/PLATELET
Abs Immature Granulocytes: 0.04 10*3/uL (ref 0.00–0.07)
Basophils Absolute: 0.1 10*3/uL (ref 0.0–0.1)
Basophils Relative: 1 %
Eosinophils Absolute: 0.1 10*3/uL (ref 0.0–0.5)
Eosinophils Relative: 1 %
HCT: 44.4 % (ref 36.0–46.0)
Hemoglobin: 14.7 g/dL (ref 12.0–15.0)
Immature Granulocytes: 0 %
Lymphocytes Relative: 22 %
Lymphs Abs: 2.1 10*3/uL (ref 0.7–4.0)
MCH: 30.6 pg (ref 26.0–34.0)
MCHC: 33.1 g/dL (ref 30.0–36.0)
MCV: 92.3 fL (ref 80.0–100.0)
Monocytes Absolute: 0.6 10*3/uL (ref 0.1–1.0)
Monocytes Relative: 7 %
Neutro Abs: 6.3 10*3/uL (ref 1.7–7.7)
Neutrophils Relative %: 69 %
Platelets: 343 10*3/uL (ref 150–400)
RBC: 4.81 MIL/uL (ref 3.87–5.11)
RDW: 12 % (ref 11.5–15.5)
WBC: 9.1 10*3/uL (ref 4.0–10.5)
nRBC: 0 % (ref 0.0–0.2)

## 2021-11-28 LAB — CBG MONITORING, ED
Glucose-Capillary: 562 mg/dL (ref 70–99)
Glucose-Capillary: 565 mg/dL (ref 70–99)
Glucose-Capillary: 485 mg/dL — ABNORMAL HIGH (ref 70–99)
Glucose-Capillary: 380 mg/dL — ABNORMAL HIGH (ref 70–99)

## 2021-11-28 LAB — OSMOLALITY: Osmolality: 313 mOsm/kg — ABNORMAL HIGH (ref 275–295)

## 2021-11-28 LAB — BLOOD GAS, VENOUS
Acid-base deficit: 3.6 mmol/L — ABNORMAL HIGH (ref 0.0–2.0)
Bicarbonate: 21.5 mmol/L (ref 20.0–28.0)
O2 Saturation: 97.7 %
Patient temperature: 37
pCO2, Ven: 38 mmHg — ABNORMAL LOW (ref 44–60)
pH, Ven: 7.36 (ref 7.25–7.43)
pO2, Ven: 87 mmHg — ABNORMAL HIGH (ref 32–45)

## 2021-11-28 LAB — BETA-HYDROXYBUTYRIC ACID: Beta-Hydroxybutyric Acid: 3.45 mmol/L — ABNORMAL HIGH (ref 0.05–0.27)

## 2021-11-28 LAB — I-STAT BETA HCG BLOOD, ED (MC, WL, AP ONLY): I-stat hCG, quantitative: <5 m[IU]/mL (ref <5)

## 2021-11-28 MED ORDER — INSULIN REGULAR(HUMAN) IN NACL 100-0.9 UT/100ML-% IV SOLN
INTRAVENOUS | Status: DC
  Administered 2021-11-28: 19 [IU]/h via INTRAVENOUS
  Filled 2021-11-28: qty 100

## 2021-11-28 MED ORDER — DEXTROSE IN LACTATED RINGERS 5 % IV SOLN: INTRAVENOUS | Status: DC

## 2021-11-28 MED ORDER — DEXTROSE 50 % IV SOLN: 0.0000 mL | INTRAVENOUS | Status: DC | PRN

## 2021-11-28 MED ORDER — SODIUM CHLORIDE 0.9 % IV BOLUS
1000.0000 mL | Freq: Once | INTRAVENOUS | Status: AC
  Administered 2021-11-28: 1000 mL via INTRAVENOUS

## 2021-11-28 MED ORDER — LACTATED RINGERS IV SOLN: INTRAVENOUS | Status: DC

## 2021-11-28 MED ORDER — INSULIN ASPART 100 UNIT/ML IJ SOLN
10.0000 [IU] | Freq: Once | INTRAMUSCULAR | Status: AC
  Administered 2021-11-28: 10 [IU] via SUBCUTANEOUS
  Filled 2021-11-28: qty 0.1

## 2021-11-28 NOTE — ED Notes (Signed)
Walked in to patients room patient was taking off monitoring equipment stating "I'm ready to go I can't stay"  Risk of leaving AMA was explained in depth including worsening condition, death or serious injury:"  Verbalized understanding and Dr. Marylyn Ishihara notified.

## 2021-11-28 NOTE — Discharge Summary (Signed)
Against Medical Advice  Notified by nursing that the patient expresses desire to leave the hospital immediately. Reason given: NONE. The patient has been warned that this is not medically advisable, and can result in severe medical complications. Despite this warning, the patient still desires to leave the hospital immediately. The patient has signed the [place] form signifying understanding and acceptance of full responsibility of the risks involved involved in this decision.    This patient has also been advised that if they feel the need for further medical assistance to return to any available ER or dial [phone].   Informed by Nursing staff that this patient has left care and has signed the form Against Medical Advice on 11/28/21 at 1421hrs.    Final diagnosis: Hyperglycemia Uncontrolled DM2 HTN HLD Depression Anxiety   Jonnie Finner, DO

## 2021-11-28 NOTE — ED Notes (Signed)
Patient is refusing to stay.  Dr Marylyn Ishihara notified and wants her to sign AMA form

## 2021-11-28 NOTE — ED Provider Notes (Signed)
[facility] DEPT Provider Note   CSN: [id number] Arrival date & time: 11/28/21  1049     History  Chief Complaint  Patient presents with   Hyperglycemia    Tracy Johns is a 46 y.o. female.  She is a fairly new diagnosis of diabetes.  She is only on Lantus nightly due to insurance issues and has been steadily increasing over the course of a few months.  Currently taking 60 units.  Blood sugars have been in the high 200s up to 600.  Complaining of fatigue, pain in her back.  Thinks she might have a yeast infection, itchiness.  Has had insurance issues so for now has been unable to add any additional agents.  No fevers or chills.  Has had a cough productive of some yellow sputum.  Loose stools but not clear diarrhea.  No urinary symptoms.  No hematuria.  Has been adding additional Humalog from her husband who is also a diabetic although has not prescribed this medication.  The history is provided by the patient.  Hyperglycemia Blood sugar level PTA:  600 Severity:  Severe Duration:  1 month Timing:  Constant Progression:  Unchanged Chronicity:  New Diabetes status:  Controlled with insulin Current diabetic therapy:  Lantis 60 ghs Relieved by:  Nothing Ineffective treatments:  None tried Associated symptoms: dizziness, fatigue, malaise and nausea   Associated symptoms: no abdominal pain, no chest pain, no dysuria, no shortness of breath and no vomiting        Home Medications Prior to Admission medications   Medication Sig Start Date End Date Taking? Authorizing Provider  Accu-Chek FastClix Lancets MISC 1 each by Does not apply route daily. 10/15/21   Lilland, Alana, DO  acetaminophen (TYLENOL) 500 MG tablet Take 500 mg by mouth every 6 (six) hours as needed.    [provider]  albuterol (VENTOLIN HFA) 108 (90 Base) MCG/ACT inhaler INHALE 1 TO 2 PUFFS INTO THE LUNGS EVERY 6 HOURS AS NEEDED FOR WHEEZING OR SHORTNESS OF  BREATH Patient taking differently: Inhale 1-2 puffs into the lungs every 6 (six) hours as needed. 10/11/19   Libby Maw, MD  aspirin-acetaminophen-caffeine (EXCEDRIN MIGRAINE) [phone] MG tablet Take by mouth every 6 (six) hours as needed for headache.    [provider]  blood glucose meter kit and supplies KIT Dispense based on patient and insurance preference. Use up to four times daily as directed. 10/15/21   Lilland, Alana, DO  Dulaglutide (TRULICITY) 4.82 LM/7.8ML SOPN Inject 0.75 mg into the skin once a week. 10/29/21   Lilland, Alana, DO  FLUoxetine (PROZAC) 40 MG capsule Take 1 capsule (40 mg total) by mouth daily. 10/15/21   Lilland, Alana, DO  hydrochlorothiazide (HYDRODIURIL) 25 MG tablet TAKE 1 TABLET(25 MG) BY MOUTH DAILY 10/15/21   Lilland, Alana, DO  insulin glargine (LANTUS) 100 UNIT/ML Solostar Pen Inject 10 Units into the skin daily. Increase by 2 Units daily for fasting blood sugars >150. 11/15/21   Lilland, Alana, DO  Insulin Pen Needle (PEN NEEDLES 3/16") 31G X 5 MM MISC 25 Units by Does not apply route daily. 10/15/21   Lilland, Alana, DO  Lancets Misc. (ACCU-CHEK FASTCLIX LANCET) KIT 1 each by Does not apply route daily. 10/15/21   Lilland, Alana, DO  lidocaine (SALONPAS PAIN RELIEVING) 4 % Place 1 patch onto the skin every 12 (twelve) hours. 11/04/21   Kommor, Madison, MD  methocarbamol (ROBAXIN) 500 MG tablet TAKE 1 TABLET(500 MG) BY MOUTH EVERY 8  HOURS AS NEEDED FOR MUSCLE SPASMS 05/28/21   Libby Maw, MD  ondansetron (ZOFRAN-ODT) 4 MG disintegrating tablet Take 1 tablet (4 mg total) by mouth every 8 (eight) hours as needed for nausea or vomiting. 11/04/21   Kommor, Madison, MD  rosuvastatin (CRESTOR) 10 MG tablet Take 1 tablet (10 mg total) by mouth daily. 10/16/21   Lilland, Alana, DO  telmisartan (MICARDIS) 40 MG tablet TAKE 1 TABLET(40 MG) BY MOUTH DAILY 08/14/21   Libby Maw, MD      Allergies    Imitrex [sumatriptan], Oxycodone, and Percocet  [oxycodone-acetaminophen]    Review of Systems   Review of Systems  Constitutional:  Positive for fatigue.  HENT:  Negative for sore throat.   Eyes:  Negative for visual disturbance.  Respiratory:  Negative for shortness of breath.   Cardiovascular:  Negative for chest pain.  Gastrointestinal:  Positive for nausea. Negative for abdominal pain and vomiting.  Genitourinary:  Negative for dysuria.  Musculoskeletal:  Positive for back pain.  Neurological:  Positive for dizziness.    Physical Exam Updated Vital Signs BP (!) 130/93 (BP Location: Left Arm)   Pulse 93   Temp 98.1 F (36.7 C) (Oral)   Resp 18   LMP 10/20/2021 Comment: tubal ligation 2000  SpO2 100%  Physical Exam Vitals and nursing note reviewed.  Constitutional:      General: She is not in acute distress.    Appearance: Normal appearance. She is well-developed.  HENT:     Head: Normocephalic and atraumatic.  Eyes:     Conjunctiva/sclera: Conjunctivae normal.  Cardiovascular:     Rate and Rhythm: Normal rate and regular rhythm.     Heart sounds: No murmur heard. Pulmonary:     Effort: Pulmonary effort is normal. No respiratory distress.     Breath sounds: Normal breath sounds.  Abdominal:     Palpations: Abdomen is soft.     Tenderness: There is no abdominal tenderness. There is no guarding or rebound.  Musculoskeletal:     Cervical back: Neck supple.     Right lower leg: No edema.     Left lower leg: No edema.  Skin:    General: Skin is warm and dry.     Capillary Refill: Capillary refill takes less than 2 seconds.  Neurological:     General: No focal deficit present.     Mental Status: She is alert.     Sensory: No sensory deficit.     Motor: No weakness.     Gait: Gait normal.     ED Results / Procedures / Treatments   Labs (all labs ordered are listed, but only abnormal results are displayed) Labs Reviewed  COMPREHENSIVE METABOLIC PANEL - Abnormal; Notable for the following components:       Result Value   Sodium 131 (*)    Chloride 97 (*)    CO2 20 (*)    Glucose, Bld 593 (*)    AST 14 (*)    All other components within normal limits  URINALYSIS, ROUTINE W REFLEX MICROSCOPIC - Abnormal; Notable for the following components:   Color, Urine STRAW (*)    Glucose, UA >=500 (*)    Ketones, ur 80 (*)    Bacteria, UA RARE (*)    All other components within normal limits  BETA-HYDROXYBUTYRIC ACID - Abnormal; Notable for the following components:   Beta-Hydroxybutyric Acid 3.45 (*)    All other components within normal limits  OSMOLALITY - Abnormal; Notable  for the following components:   Osmolality 313 (*)    All other components within normal limits  BLOOD GAS, VENOUS - Abnormal; Notable for the following components:   pCO2, Ven 38 (*)    pO2, Ven 87 (*)    Acid-base deficit 3.6 (*)    All other components within normal limits  CBG MONITORING, ED - Abnormal; Notable for the following components:   Glucose-Capillary 562 (*)    All other components within normal limits  CBG MONITORING, ED - Abnormal; Notable for the following components:   Glucose-Capillary 565 (*)    All other components within normal limits  CBG MONITORING, ED - Abnormal; Notable for the following components:   Glucose-Capillary 485 (*)    All other components within normal limits  CBG MONITORING, ED - Abnormal; Notable for the following components:   Glucose-Capillary 380 (*)    All other components within normal limits  CBC WITH DIFFERENTIAL/PLATELET  CBC WITH DIFFERENTIAL/PLATELET  I-STAT BETA HCG BLOOD, ED ([facility], [facility], [facility] ONLY)    EKG EKG Interpretation  Date/Time:  Thursday November 28 2021 11:30:31 EDT Ventricular Rate:  90 PR Interval:  129 QRS Duration: 82 QT Interval:  358 QTC Calculation: 438 R Axis:   80 Text Interpretation: Sinus rhythm Probable left atrial enlargement No significant change since prior 11/22 Confirmed by Aletta Edouard [phone]) on 11/28/2021 11:55:47 AM  Radiology DG  Chest Port 1 View  Result Date: 11/28/2021 CLINICAL DATA:  cough, hyperglycemia EXAM: PORTABLE CHEST 1 VIEW COMPARISON:  December 30, 2018 FINDINGS: The heart size and mediastinal contours are within normal limits. Both lungs are clear and stable. The visualized skeletal structures are unremarkable. IMPRESSION: No active disease. Electronically Signed   By: Frazier Richards M.D.   On: 11/28/2021 11:58    Procedures .Critical Care  Performed by: Hayden Rasmussen, MD Authorized by: Hayden Rasmussen, MD   Critical care provider statement:    Critical care time (minutes):  45   Critical care time was exclusive of:  Separately billable procedures and treating other patients   Critical care was necessary to treat or prevent imminent or life-threatening deterioration of the following conditions:  Metabolic crisis   Critical care was time spent personally by me on the following activities:  Development of treatment plan with patient or surrogate, discussions with consultants, evaluation of patient's response to treatment, examination of patient, obtaining history from patient or surrogate, ordering and performing treatments and interventions, ordering and review of laboratory studies, ordering and review of radiographic studies, pulse oximetry, re-evaluation of patient's condition and review of old charts   I assumed direction of critical care for this patient from another provider in my specialty: no       Medications Ordered in ED Medications  insulin regular, human (MYXREDLIN) 100 units/ 100 mL infusion (0 Units/hr Intravenous Stopped 11/28/21 1415)  lactated ringers infusion (0 mLs Intravenous Stopped 11/28/21 1415)  dextrose 5 % in lactated ringers infusion (has no administration in time range)  dextrose 50 % solution 0-50 mL (has no administration in time range)  sodium chloride 0.9 % bolus 1,000 mL (0 mLs Intravenous Stopped 11/28/21 1330)  insulin aspart (novoLOG) injection 10 Units (10 Units  Subcutaneous Given 11/28/21 1157)    ED Course/ Medical Decision Making/ A&P Clinical Course as of 11/28/21 1825  Thu Nov 28, 2021  1200 Chest x-ray interpreted by me as no acute infiltrates.  Awaiting radiology reading. [MB]  1314 Patient's lab work is showing elevated  glucose low bicarb although gap is normal.  pH normal although does have ketones in urine and elevated beta hydroxybutyrate.  She has had fluids and insulin.  Discussed with Dr. Marylyn Ishihara [facility] hospitalist who is recommending starting patient on Endo tool and admission to the hospital.  I have also put in a consult to the diabetic coordinator and hopefully she can catch up and make some other recommendations on home regiment. [MB]    Clinical Course User Index [MB] Hayden Rasmussen, MD                           Medical Decision Making Amount and/or Complexity of Data Reviewed Labs: ordered. Radiology: ordered.  Risk Prescription drug management.  This patient complains of elevated blood sugar fatigue malaise; this involves an extensive number of treatment Options and is a complaint that carries with it a high risk of complications and morbidity. The differential includes hyperglycemia, dehydration, DKA, HHS  I ordered, reviewed and interpreted labs, which included CBC normal, chemistries with low sodium pseudohyponatremia, low bicarb elevated glucose, normal, beta hydroxybutyrate elevated, osmolality elevated, pregnancy test negative, urinalysis with ketones no signs of infection, VBG with normal pH I ordered medication IV fluids and insulin, Endo tool and reviewed PMP when indicated. I ordered imaging studies which included chest x-ray and I independently    visualized and interpreted imaging which showed no acute infiltrate Additional history obtained from patient's family member Previous records obtained and reviewed in epic including prior admission and PCP visits I consulted Dr. Marylyn Ishihara [facility] hospitalist and discussed lab  and imaging findings and discussed disposition.  Cardiac monitoring reviewed, normal sinus rhythm Social determinants considered, patient with ongoing tobacco use and financial considerations Critical Interventions: Initiation of Endo tool for elevated blood sugar and ketonuria  After the interventions stated above, I reevaluated the patient and found patient to be symptomatically improved Admission and further testing considered, she would benefit from mission to the hospital for continued management.  Patient in agreement with plan.          Final Clinical Impression(s) / ED Diagnoses Final diagnoses:  Hyperglycemia    Rx / DC Orders ED Discharge Orders     None         Hayden Rasmussen, MD 11/28/21 [phone]

## 2021-11-28 NOTE — H&P (Signed)
History and Physical    Patient: Tracy Johns MRN:[id number] DOB: 08/20/1975 DOA: 11/28/2021 DOS: the patient was seen and examined on 11/28/2021 PCP: Lilland, Alana, DO  Patient coming from: Home  Chief Complaint:  Chief Complaint  Patient presents with   Hyperglycemia   HPI: Tracy Johns is a 46 y.o. female with medical history significant of HTN, HLD, DM2, Anxiety/depression. Presenting today with hyperglycemia and fatigue. She reports her glucose checks have been high for the last 2 days. She's been seeing 400s to high 500s. She was started on lantus 28 units 6 weeks ago and told to increase it by 2 units/day until her glucoses are controlled. She is now at 58 units. She reports that yesterday she started feeling weak and dizzy. Today she feels worse so she decided to come to the ED for evaluation.    Review of Systems: As mentioned in the history of present illness. All other systems reviewed and are negative. Past Medical History:  Diagnosis Date   DKA (diabetic ketoacidosis) (HCC) 03/19/2021   Dyspnea on exertion    History of acute pyelonephritis 06/26/2017   w/ sepsis   History of kidney stones    per pt 2008 and passed spontenaously   History of pseudoseizure    07-07-2017 per had first episode seizure activity that told to her caused by extreme stress in 2012, 2015 and last one 2017;  per pt was told no medication needed   Hypercholesterolemia    Hyperprolactinemia (HCC) dx 2012   endocrinologist-  dr gherghe   Hypertension    Migraines    Pituitary microadenoma (HCC)    dx per MRI 06/ 2018   Wears glasses    Past Surgical History:  Procedure Laterality Date   CESAREAN SECTION  x2  last one 2000   Bilateral Tubal Ligation w/ last c/s   CYSTOSCOPY/RETROGRADE/URETEROSCOPY Right 06/26/2017   Procedure: CYSTOSCOPY RIGHT RETROGRADE RIGHT CYSTOSCOPY;  Surgeon: Eskridge, Matthew, MD;  Location: [facility];  Service: Urology;  Laterality: Right;    CYSTOSCOPY/URETEROSCOPY/HOLMIUM LASER/STENT PLACEMENT Right 07/10/2017   Procedure: CYSTOSCOPY/URETEROSCOPY/HOLMIUM LASER/STENT EXCHANGE;  Surgeon: Eskridge, Matthew, MD;  Location: [facility];  Service: Urology;  Laterality: Right;   Social History:  reports that she has been smoking cigarettes. She has a 3.00 pack-year smoking history. She has been exposed to tobacco smoke. She has never used smokeless tobacco. She reports current alcohol use. She reports that she does not use drugs.  Allergies  Allergen Reactions   Imitrex [Sumatriptan] Nausea And Vomiting   Oxycodone Itching    Percocet "feel extremely itching and skin burns"   Percocet [Oxycodone-Acetaminophen]     itching    Family History  Problem Relation Age of Onset   Hypertension Other    Stroke Other    Heart failure Other    Heart attack Mother    Stroke Father     Prior to Admission medications   Medication Sig Start Date End Date Taking? Authorizing Provider  Accu-Chek FastClix Lancets MISC 1 each by Does not apply route daily. 10/15/21   Lilland, Alana, DO  acetaminophen (TYLENOL) 500 MG tablet Take 500 mg by mouth every 6 (six) hours as needed.    [provider]  albuterol (VENTOLIN HFA) 108 (90 Base) MCG/ACT inhaler INHALE 1 TO 2 PUFFS INTO THE LUNGS EVERY 6 HOURS AS NEEDED FOR WHEEZING OR SHORTNESS OF BREATH Patient taking differently: Inhale 1-2 puffs into the lungs every 6 (six) hours as needed. 10/11/19   Kremer,   Mortimer Fries, MD  aspirin-acetaminophen-caffeine (EXCEDRIN MIGRAINE) [phone] MG tablet Take by mouth every 6 (six) hours as needed for headache.    [provider]  blood glucose meter kit and supplies KIT Dispense based on patient and insurance preference. Use up to four times daily as directed. 10/15/21   Lilland, Alana, DO  Dulaglutide (TRULICITY) 1.96 QI/2.9NL SOPN Inject 0.75 mg into the skin once a week. 10/29/21   Lilland, Alana, DO  FLUoxetine (PROZAC) 40 MG capsule  Take 1 capsule (40 mg total) by mouth daily. 10/15/21   Lilland, Alana, DO  hydrochlorothiazide (HYDRODIURIL) 25 MG tablet TAKE 1 TABLET(25 MG) BY MOUTH DAILY 10/15/21   Lilland, Alana, DO  insulin glargine (LANTUS) 100 UNIT/ML Solostar Pen Inject 10 Units into the skin daily. Increase by 2 Units daily for fasting blood sugars >150. 11/15/21   Lilland, Alana, DO  Insulin Pen Needle (PEN NEEDLES 3/16") 31G X 5 MM MISC 25 Units by Does not apply route daily. 10/15/21   Lilland, Alana, DO  Lancets Misc. (ACCU-CHEK FASTCLIX LANCET) KIT 1 each by Does not apply route daily. 10/15/21   Lilland, Alana, DO  lidocaine (SALONPAS PAIN RELIEVING) 4 % Place 1 patch onto the skin every 12 (twelve) hours. 11/04/21   Kommor, Madison, MD  methocarbamol (ROBAXIN) 500 MG tablet TAKE 1 TABLET(500 MG) BY MOUTH EVERY 8 HOURS AS NEEDED FOR MUSCLE SPASMS 05/28/21   Libby Maw, MD  ondansetron (ZOFRAN-ODT) 4 MG disintegrating tablet Take 1 tablet (4 mg total) by mouth every 8 (eight) hours as needed for nausea or vomiting. 11/04/21   Kommor, Madison, MD  rosuvastatin (CRESTOR) 10 MG tablet Take 1 tablet (10 mg total) by mouth daily. 10/16/21   Lilland, Lorrin Goodell, DO  telmisartan (MICARDIS) 40 MG tablet TAKE 1 TABLET(40 MG) BY MOUTH DAILY 08/14/21   Libby Maw, MD    Physical Exam: Vitals:   11/28/21 1103 11/28/21 1140 11/28/21 1230 11/28/21 1300  BP: (!) 130/93 125/90 (!) 126/93 (!) 135/96  Pulse: 93 90 91 95  Resp: _0 Temp: 98.1 F (36.7 C)     TempSrc: Oral     SpO2: 100% 95% 99% 98%   General: 46 y.o. female resting in bed in NAD Eyes: PERRL, normal sclera ENMT: Nares patent w/o discharge, orophaynx clear, dentition normal, ears w/o discharge/lesions/ulcers Neck: Supple, trachea midline Cardiovascular: RRR, +S1, S2, no m/g/r, equal pulses throughout Respiratory: CTABL, no w/r/r, normal WOB GI: BS+, NDNT, no masses noted, no organomegaly noted MSK: No e/c/c Neuro: A&O x 3, no focal  deficits Psyc: Appropriate interaction and affect, calm/cooperative  Data Reviewed:    Lab Results  Component Value Date   NA 131 (L) 11/28/2021   K 4.1 11/28/2021   CO2 20 (L) 11/28/2021   GLUCOSE 593 (HH) 11/28/2021   BUN 12 11/28/2021   CREATININE 0.90 11/28/2021   CALCIUM 9.6 11/28/2021   EGFR 88 10/15/2021   GFRNONAA >60 11/28/2021   Osm 313  Lab Results  Component Value Date   WBC 9.1 11/28/2021   HGB 14.7 11/28/2021   HCT 44.4 11/28/2021   MCV 92.3 11/28/2021   PLT 343 11/28/2021    Assessment and Plan: Hyperglycemia Uncontrolled DM2     - place in obs, SDU     - Endotool for hyperglycemia started: insulin gtt, fluids     - check A1c     - NPO except for non-caloric fluids, meds while on insulin gtt     -  DM coordinator consult  HTN     - resume home regimen when confirmed  HLD     - resume home regimen when confirmed  Depression Anxiety     - resume home regimen when confirmed   Advance Care Planning:   Code Status: FULL  Consults: None  Family Communication: w/ family at bedside  Severity of Illness: The appropriate patient status for this patient is OBSERVATION. Observation status is judged to be reasonable and necessary in order to provide the required intensity of service to ensure the patient's safety. The patient's presenting symptoms, physical exam findings, and initial radiographic and laboratory data in the context of their medical condition is felt to place them at decreased risk for further clinical deterioration. Furthermore, it is anticipated that the patient will be medically stable for discharge from the hospital within 2 midnights of admission.   Author: Jonnie Finner, DO 11/28/2021 1:13 PM  For on call review [email or web address].

## 2021-11-28 NOTE — ED Triage Notes (Signed)
Patient here from home reporting hyperglycemia over 600 states that took 27 units of insulin. Fatigue, lethargic.

## 2021-12-12 ENCOUNTER — Ambulatory Visit (HOSPITAL_COMMUNITY)
Admission: EM | Admit: 2021-12-12 | Discharge: 2021-12-12 | Disposition: A | Discharge status: Home / Self Care | Payer: 59 | Attending: Internal Medicine | Admitting: Internal Medicine

## 2021-12-12 ENCOUNTER — Encounter (HOSPITAL_COMMUNITY): Payer: Self-pay | Admitting: *Deleted

## 2021-12-12 DIAGNOSIS — Z794 Long term (current) use of insulin: Secondary | ICD-10-CM

## 2021-12-12 DIAGNOSIS — B3731 Acute candidiasis of vulva and vagina: Secondary | ICD-10-CM

## 2021-12-12 DIAGNOSIS — E1169 Type 2 diabetes mellitus with other specified complication: Secondary | ICD-10-CM

## 2021-12-12 MED ORDER — FLUCONAZOLE 150 MG PO TABS: 150.0000 mg | ORAL_TABLET | Freq: Every day | ORAL | 0 refills | Status: DC

## 2021-12-12 NOTE — ED Triage Notes (Signed)
Patient sates she is diabetic and hasn't had access to her meds due to insurance. She has been having some vaginal itching, yeast infection X 2 weeks.

## 2021-12-12 NOTE — Discharge Instructions (Addendum)
I am treating you today for yeast infection. Take one pill when you pick them up and repeat in 3 days if you still have symptoms.  I would like you to call your primary care provider tomorrow morning and schedule an appointment to be seen.  As we discussed, I am concerned about you creating your own insulin regimen and would like you to be more closely monitored.  You will need to go to the ER should you develop any significant weakness, extreme fatigue, lethargy or vomiting.

## 2021-12-12 NOTE — ED Provider Notes (Signed)
[place]    CSN: [id number] Arrival date & time: 12/12/21  1559      History   Chief Complaint Chief Complaint  Patient presents with   Vaginitis    HPI Tracy Johns is a 46 y.o. female presents urgent care today with complaints of vaginal itching and white discharge x2 weeks.  Patient states she is using OTC metronidazole cream with some relief.  Patient states she has been unable to fill diabetes medications "months" as she has lost her job and insurance.  In the meantime she has been using leftover previous prescription of Lantus as well as her husband's Humalog.  Patient reports blood sugar this morning was 118 though sometimes can range anywhere from 300-500.  Patient reports occasional nausea when blood sugars run high but denies any at this time.  She denies any recent headache, dizziness, weakness, fever, chills, vomiting, abdominal pain.  Patient declines STI screening. Patient states she starts a new job on 12/24/2021 and insurance coverage should start immediately.    Past Medical History:  Diagnosis Date   DKA (diabetic ketoacidosis) ([place]) 03/19/2021   Dyspnea on exertion    History of acute pyelonephritis 06/26/2017   w/ sepsis   History of kidney stones    per pt 2008 and passed spontenaously   History of pseudoseizure    07-07-2017 per had first episode seizure activity that told to her caused by extreme stress in 2012, 2015 and last one 2017;  per pt was told no medication needed   Hypercholesterolemia    Hyperprolactinemia [facility]) dx 2012   endocrinologist-  dr Cruzita Lederer   Hypertension    Migraines    Pituitary microadenoma [facility])    dx per MRI 06/ 2018   Wears glasses     Patient Active Problem List   Diagnosis Date Noted   Type 2 diabetes mellitus with hyperglycemia, with long-term current use of insulin ([place]) 10/15/2021   Healthcare maintenance 10/15/2021   Hyperglycemia    Class 2 obesity due to excess calories with body mass index  (BMI) of 38.0 to 38.9 in adult 03/12/2020   Elevated LDL cholesterol level 03/12/2020   Reactive airway disease 09/19/2019   Left low back pain 08/17/2019   Elevated glucose 08/17/2019   Depression with anxiety 07/04/2019   Tobacco use 07/04/2019   Abnormal thyroid function test 10/06/2017   Nephrolithiasis 06/27/2017   Hypokalemia 06/27/2017   Hyponatremia 06/27/2017   Essential hypertension 06/27/2017   Sepsis ([place]) 06/27/2017   Pyelonephritis 06/26/2017   Empty sella ([place]) 04/06/2017   Prolactinoma ([place]) 04/06/2017   High serum adrenocorticotropic hormone (ACTH) 04/06/2017   Hyperprolactinemia ([place]) 10/02/2016    Past Surgical History:  Procedure Laterality Date   CESAREAN SECTION  x2  last one 2000   Bilateral Tubal Ligation w/ last c/s   CYSTOSCOPY/RETROGRADE/URETEROSCOPY Right 06/26/2017   Procedure: CYSTOSCOPY RIGHT RETROGRADE RIGHT CYSTOSCOPY;  Surgeon: Festus Aloe, MD;  Location: [facility];  Service: Urology;  Laterality: Right;   CYSTOSCOPY/URETEROSCOPY/HOLMIUM LASER/STENT PLACEMENT Right 07/10/2017   Procedure: CYSTOSCOPY/URETEROSCOPY/HOLMIUM LASER/STENT EXCHANGE;  Surgeon: Festus Aloe, MD;  Location: [facility];  Service: Urology;  Laterality: Right;    OB History   No obstetric history on file.      Home Medications    Prior to Admission medications   Medication Sig Start Date End Date Taking? Authorizing Provider  Accu-Chek FastClix Lancets MISC 1 each by Does not apply route daily. 10/15/21  Yes Lilland, Alana, DO  acetaminophen (TYLENOL)  500 MG tablet Take 500 mg by mouth every 6 (six) hours as needed.   Yes [provider]  albuterol (VENTOLIN HFA) 108 (90 Base) MCG/ACT inhaler INHALE 1 TO 2 PUFFS INTO THE LUNGS EVERY 6 HOURS AS NEEDED FOR WHEEZING OR SHORTNESS OF BREATH Patient taking differently: Inhale 1-2 puffs into the lungs every 6 (six) hours as needed. 10/11/19  Yes Libby Maw, MD   aspirin-acetaminophen-caffeine (EXCEDRIN MIGRAINE) [phone] MG tablet Take by mouth every 6 (six) hours as needed for headache.   Yes [provider]  blood glucose meter kit and supplies KIT Dispense based on patient and insurance preference. Use up to four times daily as directed. 10/15/21  Yes Lilland, Alana, DO  fluconazole (DIFLUCAN) 150 MG tablet Take 1 tablet (150 mg total) by mouth daily. 12/12/21  Yes Rudolpho Sevin, NP  FLUoxetine (PROZAC) 40 MG capsule Take 1 capsule (40 mg total) by mouth daily. 10/15/21  Yes Lilland, Alana, DO  hydrochlorothiazide (HYDRODIURIL) 25 MG tablet TAKE 1 TABLET(25 MG) BY MOUTH DAILY 10/15/21  Yes Lilland, Alana, DO  insulin glargine (LANTUS) 100 UNIT/ML Solostar Pen Inject 10 Units into the skin daily. Increase by 2 Units daily for fasting blood sugars >150. 11/15/21  Yes Lilland, Alana, DO  Insulin Pen Needle (PEN NEEDLES 3/16") 31G X 5 MM MISC 25 Units by Does not apply route daily. 10/15/21  Yes Lilland, Alana, DO  Lancets Misc. (ACCU-CHEK FASTCLIX LANCET) KIT 1 each by Does not apply route daily. 10/15/21  Yes Lilland, Alana, DO  methocarbamol (ROBAXIN) 500 MG tablet TAKE 1 TABLET(500 MG) BY MOUTH EVERY 8 HOURS AS NEEDED FOR MUSCLE SPASMS Patient taking differently: Take 500 mg by mouth every 8 (eight) hours as needed for muscle spasms. 05/28/21  Yes Libby Maw, MD  rosuvastatin (CRESTOR) 10 MG tablet Take 1 tablet (10 mg total) by mouth daily. 10/16/21  Yes Lilland, Alana, DO  telmisartan (MICARDIS) 40 MG tablet TAKE 1 TABLET(40 MG) BY MOUTH DAILY Patient taking differently: Take 40 mg by mouth daily. 08/14/21  Yes Libby Maw, MD  Dulaglutide (TRULICITY) 1.96 QI/2.9NL SOPN Inject 0.75 mg into the skin once a week. Patient not taking: Reported on 11/28/2021 10/29/21   Rise Patience, DO    Family History Family History  Problem Relation Age of Onset   Hypertension Other    Stroke Other    Heart failure Other    Heart attack Mother     Stroke Father     Social History Social History   Tobacco Use   Smoking status: Every Day    Packs/day: 0.50    Years: 6.00    Total pack years: 3.00    Types: Cigarettes    Passive exposure: Current   Smokeless tobacco: Never  Vaping Use   Vaping Use: Never used  Substance Use Topics   Alcohol use: Yes    Comment: occa   Drug use: No     Allergies   Imitrex [sumatriptan], Oxycodone, and Percocet [oxycodone-acetaminophen]   Review of Systems As stated in HPI otherwise negative   Physical Exam Triage Vital Signs ED Triage Vitals  Enc Vitals Group     BP 12/12/21 1628 (!) 136/95     Pulse Rate 12/12/21 1628 82     Resp 12/12/21 1628 18     Temp 12/12/21 1628 98.1 F (36.7 C)     Temp Source 12/12/21 1628 Oral     SpO2 12/12/21 1628 100 %  Weight --      Height --      Head Circumference --      Peak Flow --      Pain Score 12/12/21 1625 8     Pain Loc --      Pain Edu? --      Excl. in [place]? --    No data found.  Updated Vital Signs BP (!) 136/95 (BP Location: Left Arm)   Pulse 82   Temp 98.1 F (36.7 C) (Oral)   Resp 18   SpO2 100%   Visual Acuity Right Eye Distance:   Left Eye Distance:   Bilateral Distance:    Right Eye Near:   Left Eye Near:    Bilateral Near:     Physical Exam Constitutional:      General: She is not in acute distress.    Appearance: Normal appearance. She is not ill-appearing, toxic-appearing or diaphoretic.  HENT:     Mouth/Throat:     Mouth: Mucous membranes are moist.     Pharynx: Oropharynx is clear.  Eyes:     Extraocular Movements: Extraocular movements intact.     Conjunctiva/sclera: Conjunctivae normal.  Cardiovascular:     Rate and Rhythm: Normal rate and regular rhythm.     Heart sounds: No murmur heard.    No friction rub. No gallop.  Pulmonary:     Effort: Pulmonary effort is normal.     Breath sounds: Normal breath sounds.  Abdominal:     General: Bowel sounds are normal.     Palpations:  Abdomen is soft.  Musculoskeletal:        General: Normal range of motion.     Cervical back: Normal range of motion and neck supple.  Skin:    General: Skin is warm and dry.  Neurological:     General: No focal deficit present.     Mental Status: She is alert and oriented to person, place, and time.  Psychiatric:        Mood and Affect: Mood normal.        Behavior: Behavior normal.      UC Treatments / Results  Labs (all labs ordered are listed, but only abnormal results are displayed) Labs Reviewed - No data to display  EKG   Radiology No results found.  Procedures Procedures (including critical care time)  Medications Ordered in UC Medications - No data to display  Initial Impression / Assessment and Plan / UC Course  I have reviewed the triage vital signs and the nursing notes.  Pertinent labs & imaging results that were available during my care of the patient were reviewed by me and considered in my medical decision making (see chart for details).  Yeast infection -Likely due to uncontrolled diabetes.  Continue OTC antifungal cream.  Patient declined prescription cream -Diflucan  DM2 with medical noncompliance -Patient reports noncompliance with prescribed regimen due to financial constraints.  She is currently taking previously prescribed and left over Lantus as well as her husband's Humalog.  I discussed starting metformin as this is relatively inexpensive and effective.  Patient refused as she states she has an allergy to this medication and is not interested in trying again -Long discussion regarding dangers of taking nonprescribed medication -No evidence of DKA.  Patient aware of signs and symptoms to monitor for that would necessitate ER visit -Instructed to arrange follow-up with PCP as they may have more resources to help patient get medications  Reviewed expections re:  course of current medical issues. Questions answered. Outlined signs and symptoms  indicating need for more acute intervention. Pt verbalized understanding. AVS given  Final Clinical Impressions(s) / UC Diagnoses   Final diagnoses:  Vaginal yeast infection  Type 2 diabetes mellitus with other specified complication, with long-term current use of insulin [facility])     Discharge Instructions      I am treating you today for yeast infection. Take one pill when you pick them up and repeat in 3 days if you still have symptoms.  I would like you to call your primary care provider tomorrow morning and schedule an appointment to be seen.  As we discussed, I am concerned about you creating your own insulin regimen and would like you to be more closely monitored.  You will need to go to the ER should you develop any significant weakness, extreme fatigue, lethargy or vomiting.     ED Prescriptions     Medication Sig Dispense Auth. Provider   fluconazole (DIFLUCAN) 150 MG tablet Take 1 tablet (150 mg total) by mouth daily. 2 tablet Rudolpho Sevin, NP      PDMP not reviewed this encounter.   Rudolpho Sevin, NP 12/12/21 [phone]

## 2021-12-18 ENCOUNTER — Encounter (INDEPENDENT_AMBULATORY_CARE_PROVIDER_SITE_OTHER): Payer: Self-pay

## 2022-03-21 LAB — PROINSULIN/INSULIN RATIO
Insulin: 6.1 u[IU]/mL
Proinsulin: 1.9 pmol/L

## 2022-07-09 ENCOUNTER — Ambulatory Visit (HOSPITAL_COMMUNITY)
Admission: EM | Admit: 2022-07-09 | Discharge: 2022-07-09 | Disposition: A | Discharge status: Home / Self Care | Payer: 59 | Attending: Internal Medicine | Admitting: Internal Medicine

## 2022-07-09 ENCOUNTER — Encounter (HOSPITAL_COMMUNITY): Payer: Self-pay | Admitting: *Deleted

## 2022-07-09 DIAGNOSIS — B3731 Acute candidiasis of vulva and vagina: Secondary | ICD-10-CM

## 2022-07-09 DIAGNOSIS — E1165 Type 2 diabetes mellitus with hyperglycemia: Secondary | ICD-10-CM

## 2022-07-09 DIAGNOSIS — Z794 Long term (current) use of insulin: Secondary | ICD-10-CM

## 2022-07-09 LAB — POCT URINALYSIS DIPSTICK, ED / UC
Bilirubin Urine: NEGATIVE
Glucose, UA: >=1000 mg/dL — AB
Ketones, ur: 40 mg/dL — AB
Leukocytes,Ua: NEGATIVE
Nitrite: NEGATIVE
Protein, ur: NEGATIVE mg/dL
Specific Gravity, Urine: 1.01 (ref 1.005–1.030)
Urobilinogen, UA: 0.2 mg/dL (ref 0.0–1.0)
pH: 5.5 (ref 5.0–8.0)

## 2022-07-09 LAB — CBG MONITORING, ED: Glucose-Capillary: 457 mg/dL — ABNORMAL HIGH (ref 70–99)

## 2022-07-09 MED ORDER — FLUCONAZOLE 150 MG PO TABS: 150.0000 mg | ORAL_TABLET | Freq: Every day | ORAL | 0 refills | Status: DC

## 2022-07-09 NOTE — ED Provider Notes (Signed)
[place]   CJ:[id number] 07/09/22 Arrival Time: 0830  ASSESSMENT & PLAN:  1. Yeast vaginitis   2. Type 2 diabetes mellitus with hyperglycemia, with long-term current use of insulin ([facility])    -Clinical presentation is consistent with yeast vaginitis.  Will treat empirically with Diflucan.  Will send the swab to the lab for confirmation of the diagnosis.  -Regards to her type 2 diabetes, this is poorly controlled.  Her point-of-care glucose today was 457.  No signs or symptoms of DKA or HHS.  Her treatment is limited by her social situation which includes a lack of insurance coverage.  I offered to send her a prescription for insulin but she declined.  I have reached out to my colleagues at the family medicine center to see if she can be part of any medication assistance program they have down there.  They will reach out to her regarding this.   Meds ordered this encounter  Medications   fluconazole (DIFLUCAN) 150 MG tablet    Sig: Take 1 tablet (150 mg total) by mouth daily. If no improvement after 72 hours, may take second pill    Dispense:  2 tablet    Refill:  0   Discharge Instructions   None       Reviewed expectations re: course of current medical issues. Questions answered. Outlined signs and symptoms indicating need for more acute intervention. Patient verbalized understanding. After Visit Summary given.   SUBJECTIVE: Pleasant 47 year old female with past medical history of insulin-dependent type 2 diabetes comes urgent care to be evaluated for vaginal itching.  She reports vaginal irritation and itching for about a week.  No new soaps or detergents.  She tried over-the-counter Monistat and Vagisil cream without relief.  No vaginal discharge.  She has had yeast infections in the past and this feels similarly.  She does think her yeast infections are related to her diabetes.  She is an insulin-dependent type 2 diabetic but does not have any health insurance.  She  has not been able to take insulin for about a year now.  She does have family members who are diabetics and she will occasionally borrow some of their insulin but she is otherwise unmedicated.  Her point-of-care glucose today is 457.  This is fairly similar to her trends over the last 8 months.  She denies any blurry vision, headache, chest pain, shortness of breath, nausea or vomiting.  No LMP recorded. (Menstrual status: Perimenopausal). Past Surgical History:  Procedure Laterality Date   CESAREAN SECTION  x2  last one 2000   Bilateral Tubal Ligation w/ last c/s   CYSTOSCOPY/RETROGRADE/URETEROSCOPY Right 06/26/2017   Procedure: CYSTOSCOPY RIGHT RETROGRADE RIGHT CYSTOSCOPY;  Surgeon: Festus Aloe, MD;  Location: [facility];  Service: Urology;  Laterality: Right;   CYSTOSCOPY/URETEROSCOPY/HOLMIUM LASER/STENT PLACEMENT Right 07/10/2017   Procedure: CYSTOSCOPY/URETEROSCOPY/HOLMIUM LASER/STENT EXCHANGE;  Surgeon: Festus Aloe, MD;  Location: [facility];  Service: Urology;  Laterality: Right;     OBJECTIVE:  Vitals:   07/09/22 0848  BP: (!) 141/92  Pulse: 90  Resp: 16  Temp: 97.9 F (36.6 C)  TempSrc: Oral  SpO2: 95%     Physical Exam Vitals reviewed.  Constitutional:      Appearance: Normal appearance. She is not ill-appearing or toxic-appearing.  Cardiovascular:     Rate and Rhythm: Normal rate.     Heart sounds: Normal heart sounds.  Pulmonary:     Effort: Pulmonary effort is normal. No respiratory distress.  Abdominal:  General: Abdomen is flat. Bowel sounds are normal.     Palpations: Abdomen is soft.     Tenderness: There is no abdominal tenderness. There is no guarding.  Musculoskeletal:        General: Normal range of motion.  Skin:    General: Skin is warm.  Neurological:     General: No focal deficit present.  Psychiatric:        Mood and Affect: Mood normal.      Labs: Results for orders placed or performed during the hospital  encounter of 07/09/22  POC Urinalysis dipstick  Result Value Ref Range   Glucose, UA >=1000 (A) NEGATIVE mg/dL   Bilirubin Urine NEGATIVE NEGATIVE   Ketones, ur 40 (A) NEGATIVE mg/dL   Specific Gravity, Urine 1.010 1.005 - 1.030   Hgb urine dipstick TRACE (A) NEGATIVE   pH 5.5 5.0 - 8.0   Protein, ur NEGATIVE NEGATIVE mg/dL   Urobilinogen, UA 0.2 0.0 - 1.0 mg/dL   Nitrite NEGATIVE NEGATIVE   Leukocytes,Ua NEGATIVE NEGATIVE  POC CBG monitoring  Result Value Ref Range   Glucose-Capillary 457 (H) 70 - 99 mg/dL   Labs Reviewed  POCT URINALYSIS DIPSTICK, ED / UC - Abnormal; Notable for the following components:      Result Value   Glucose, UA >=1000 (*)    Ketones, ur 40 (*)    Hgb urine dipstick TRACE (*)    All other components within normal limits  CBG MONITORING, ED - Abnormal; Notable for the following components:   Glucose-Capillary 457 (*)    All other components within normal limits  CERVICOVAGINAL ANCILLARY ONLY    Imaging: No results found.   Allergies  Allergen Reactions   Imitrex [Sumatriptan] Nausea And Vomiting   Oxycodone Itching    Percocet "feel extremely itching and skin burns" - states tolerate regular oxycodone well   Percocet [Oxycodone-Acetaminophen]     itching                                               Past Medical History:  Diagnosis Date   DKA (diabetic ketoacidosis) ([place]) 03/19/2021   Dyspnea on exertion    History of acute pyelonephritis 06/26/2017   w/ sepsis   History of kidney stones    per pt 2008 and passed spontenaously   History of pseudoseizure    07-07-2017 per had first episode seizure activity that told to her caused by extreme stress in 2012, 2015 and last one 2017;  per pt was told no medication needed   Hypercholesterolemia    Hyperprolactinemia [facility]) dx 2012   endocrinologist-  dr Cruzita Lederer   Hypertension    Migraines    Pituitary microadenoma [facility])    dx per MRI 06/ 2018   Wears glasses     Social History    Socioeconomic History   Marital status: Married    Spouse name: Not on file   Number of children: Not on file   Years of education: Not on file   Highest education level: Not on file  Occupational History   Not on file  Tobacco Use   Smoking status: Every Day    Packs/day: 0.50    Years: 6.00    Total pack years: 3.00    Types: Cigarettes    Passive exposure: Current   Smokeless tobacco: Never  Vaping Use  Vaping Use: Never used  Substance and Sexual Activity   Alcohol use: Not Currently   Drug use: Never   Sexual activity: Yes    Birth control/protection: None  Other Topics Concern   Not on file  Social History Narrative   Not on file   Social Determinants of Health   Financial Resource Strain: Not on file  Food Insecurity: Not on file  Transportation Needs: Not on file  Physical Activity: Not on file  Stress: Not on file  Social Connections: Not on file  Intimate Partner Violence: Not on file    Family History  Problem Relation Age of Onset   Hypertension Other    Stroke Other    Heart failure Other    Heart attack Mother    Stroke Father       Aric Jost, Dorian Pod, MD 07/09/22 1049

## 2022-07-09 NOTE — ED Triage Notes (Addendum)
C/O significant vaginal irritation x approx 8 days. Has been using Monistat-7 and Vagisil cream without relief. Denies vaginal discharge. Pt states she has been unable to afford any Rxs since losing job over past year -- has not been taking insulin or any other Rx meds. States she checks her CBGs but "they are off the charts and not even reading". States spouse and daughter are diabetics, and very occasionally will take a dose of their insulin.

## 2022-07-10 LAB — CERVICOVAGINAL ANCILLARY ONLY
Bacterial Vaginitis (gardnerella): POSITIVE — AB
Candida Glabrata: POSITIVE — AB
Candida Vaginitis: POSITIVE — AB
Comment: NEGATIVE
Comment: NEGATIVE
Comment: NEGATIVE

## 2022-07-14 ENCOUNTER — Telehealth (HOSPITAL_COMMUNITY): Payer: Self-pay | Admitting: Emergency Medicine

## 2022-07-14 MED ORDER — METRONIDAZOLE 500 MG PO TABS: 500.0000 mg | ORAL_TABLET | Freq: Two times a day (BID) | ORAL | 0 refills | Status: DC

## 2022-11-07 ENCOUNTER — Encounter (HOSPITAL_COMMUNITY): Payer: Self-pay

## 2022-11-07 ENCOUNTER — Ambulatory Visit (HOSPITAL_COMMUNITY)
Admission: EM | Admit: 2022-11-07 | Discharge: 2022-11-07 | Disposition: A | Discharge status: Home / Self Care | Payer: Self-pay | Attending: Physician Assistant | Admitting: Physician Assistant

## 2022-11-07 DIAGNOSIS — Z794 Long term (current) use of insulin: Secondary | ICD-10-CM | POA: Insufficient documentation

## 2022-11-07 DIAGNOSIS — E1165 Type 2 diabetes mellitus with hyperglycemia: Secondary | ICD-10-CM | POA: Insufficient documentation

## 2022-11-07 DIAGNOSIS — N760 Acute vaginitis: Secondary | ICD-10-CM | POA: Insufficient documentation

## 2022-11-07 LAB — POCT URINALYSIS DIP (MANUAL ENTRY)
Blood, UA: NEGATIVE
Glucose, UA: >=1000 mg/dL — AB
Leukocytes, UA: NEGATIVE
Nitrite, UA: NEGATIVE
Spec Grav, UA: >=1.03 — AB (ref 1.010–1.025)
Urobilinogen, UA: 1 E.U./dL
pH, UA: 5 (ref 5.0–8.0)

## 2022-11-07 LAB — POCT URINE PREGNANCY: Preg Test, Ur: NEGATIVE

## 2022-11-07 LAB — POCT FASTING CBG KUC MANUAL ENTRY: POCT Glucose (KUC): 382 mg/dL — AB (ref 70–99)

## 2022-11-07 MED ORDER — INSULIN GLARGINE 100 UNIT/ML SOLOSTAR PEN: 12.0000 [IU] | PEN_INJECTOR | Freq: Every day | SUBCUTANEOUS | 0 refills | Status: DC

## 2022-11-07 MED ORDER — FLUCONAZOLE 150 MG PO TABS: 150.0000 mg | ORAL_TABLET | ORAL | 0 refills | Status: AC

## 2022-11-07 NOTE — ED Triage Notes (Signed)
Pt is here for vaginal itching and burning. Pt reports her urine has an odor.

## 2022-11-07 NOTE — Discharge Instructions (Signed)
Your blood sugar is very elevated.  I suspect this is contributing to your yeast infections.  Is very important that we get this under control.  Increase your long-acting insulin to 12 units.  Monitor your blood sugar each morning before you eat and keep a log for evaluation at your next appointment.  Follow-up with your primary care soon as possible.  If you have any trouble getting in with them within the next few weeks please return here.  Make sure that you are drinking lots of water.  Take Diflucan to treat the yeast infection.  You will take 1 dose every 3 days until your symptoms resolve or until you have completed 3 doses.  I will contact you if we need to arrange additional treatment based on your swab result.  If you have any worsening or changing symptoms including increasing discomfort or change in your discharge, nausea, vomiting, shortness of breath, confusion, weakness you need to be seen immediately.

## 2022-11-07 NOTE — ED Provider Notes (Signed)
[facility]    CSN: [id number] Arrival date & time: 11/07/22  1241      History   Chief Complaint Chief Complaint  Patient presents with   Vaginal Itching   Dysuria    HPI Tracy Johns is a 47 y.o. female.   Patient presents today with a weeklong history of vaginal irritation.  She reports associated odor and is unsure if this is from vaginal discharge or urine.  She does report some urinary frequency but this is chronic related to her diabetes.  She denies any recent medication changes or antibiotic use.  She has previously not been using any kind of harsh soaps or detergents in case this is contributing to symptoms.  She has tried over-the-counter Monistat without improvement of symptoms.  She does report that her blood sugars are not adequately controlled.  She is in the process of reestablishing with her primary care as she was without insurance but got a new job that we will have her insurance beginning 11/10/2022.  She does have long-acting insulin at home and has been using this regularly but not taking any additional over-the-counter medications.  She does not take SGLT2 inhibitor.  She has no concern for STI as she is in a monogamous marriage.    Past Medical History:  Diagnosis Date   DKA (diabetic ketoacidosis) (HCC) 03/19/2021   Dyspnea on exertion    History of acute pyelonephritis 06/26/2017   w/ sepsis   History of kidney stones    per pt 2008 and passed spontenaously   History of pseudoseizure    07-07-2017 per had first episode seizure activity that told to her caused by extreme stress in 2012, 2015 and last one 2017;  per pt was told no medication needed   Hypercholesterolemia    Hyperprolactinemia [facility]) dx 2012   endocrinologist-  dr Elvera Lennox   Hypertension    Migraines    Pituitary microadenoma [facility])    dx per MRI 06/ 2018   Wears glasses     Patient Active Problem List   Diagnosis Date Noted   Type 2 diabetes mellitus with  hyperglycemia, with long-term current use of insulin (HCC) 10/15/2021   Healthcare maintenance 10/15/2021   Hyperglycemia    Class 2 obesity due to excess calories with body mass index (BMI) of 38.0 to 38.9 in adult 03/12/2020   Elevated LDL cholesterol level 03/12/2020   Reactive airway disease 09/19/2019   Left low back pain 08/17/2019   Elevated glucose 08/17/2019   Depression with anxiety 07/04/2019   Tobacco use 07/04/2019   Abnormal thyroid function test 10/06/2017   Nephrolithiasis 06/27/2017   Hypokalemia 06/27/2017   Hyponatremia 06/27/2017   Essential hypertension 06/27/2017   Sepsis (HCC) 06/27/2017   Pyelonephritis 06/26/2017   Empty sella (HCC) 04/06/2017   Prolactinoma (HCC) 04/06/2017   High serum adrenocorticotropic hormone (ACTH) 04/06/2017   Hyperprolactinemia (HCC) 10/02/2016    Past Surgical History:  Procedure Laterality Date   CESAREAN SECTION  x2  last one 2000   Bilateral Tubal Ligation w/ last c/s   CYSTOSCOPY/RETROGRADE/URETEROSCOPY Right 06/26/2017   Procedure: CYSTOSCOPY RIGHT RETROGRADE RIGHT CYSTOSCOPY;  Surgeon: Jerilee Field, MD;  Location: [facility];  Service: Urology;  Laterality: Right;   CYSTOSCOPY/URETEROSCOPY/HOLMIUM LASER/STENT PLACEMENT Right 07/10/2017   Procedure: CYSTOSCOPY/URETEROSCOPY/HOLMIUM LASER/STENT EXCHANGE;  Surgeon: Jerilee Field, MD;  Location: [facility];  Service: Urology;  Laterality: Right;    OB History   No obstetric history on file.  Home Medications    Prior to Admission medications   Medication Sig Start Date End Date Taking? Authorizing Provider  Accu-Chek FastClix Lancets MISC 1 each by Does not apply route daily. 10/15/21  Yes Lilland, Alana, DO  albuterol (VENTOLIN HFA) 108 (90 Base) MCG/ACT inhaler INHALE 1 TO 2 PUFFS INTO THE LUNGS EVERY 6 HOURS AS NEEDED FOR WHEEZING OR SHORTNESS OF BREATH Patient taking differently: Inhale 1-2 puffs into the lungs every 6 (six) hours as needed.  10/11/19  Yes Mliss Sax, MD  aspirin-acetaminophen-caffeine (EXCEDRIN MIGRAINE) [phone] MG tablet Take by mouth every 6 (six) hours as needed for headache.   Yes [provider]  blood glucose meter kit and supplies KIT Dispense based on patient and insurance preference. Use up to four times daily as directed. 10/15/21  Yes Lilland, Alana, DO  fluconazole (DIFLUCAN) 150 MG tablet Take 1 tablet (150 mg total) by mouth every 3 (three) days for 3 doses. 11/07/22 11/14/22 Yes Gary Gabrielsen K, PA-C  Insulin Pen Needle (PEN NEEDLES 3/16") 31G X 5 MM MISC 25 Units by Does not apply route daily. 10/15/21  Yes Lilland, Alana, DO  Lancets Misc. (ACCU-CHEK FASTCLIX LANCET) KIT 1 each by Does not apply route daily. 10/15/21  Yes Lilland, Alana, DO  rosuvastatin (CRESTOR) 10 MG tablet Take 1 tablet (10 mg total) by mouth daily. 10/16/21  Yes Lilland, Alana, DO  acetaminophen (TYLENOL) 500 MG tablet Take 500 mg by mouth every 6 (six) hours as needed.    [provider]  Dulaglutide (TRULICITY) 0.75 MG/0.5ML SOPN Inject 0.75 mg into the skin once a week. Patient not taking: Reported on 11/28/2021 10/29/21   Lilland, Alana, DO  FLUoxetine (PROZAC) 40 MG capsule Take 1 capsule (40 mg total) by mouth daily. 10/15/21   Lilland, Alana, DO  hydrochlorothiazide (HYDRODIURIL) 25 MG tablet TAKE 1 TABLET(25 MG) BY MOUTH DAILY 10/15/21   Lilland, Alana, DO  insulin glargine (LANTUS) 100 UNIT/ML Solostar Pen Inject 12 Units into the skin at bedtime. Increase by 2 Units daily for fasting blood sugars >150. 11/07/22   [place]under [place] K, PA-C  telmisartan (MICARDIS) 40 MG tablet TAKE 1 TABLET(40 MG) BY MOUTH DAILY Patient taking differently: Take 40 mg by mouth daily. 08/14/21   Mliss Sax, MD    Family History Family History  Problem Relation Age of Onset   Hypertension Other    Stroke Other    Heart failure Other    Heart attack Mother    Stroke Father     Social History Social History    Tobacco Use   Smoking status: Every Day    Packs/day: 0.50    Years: 6.00    Additional pack years: 0.00    Total pack years: 3.00    Types: Cigarettes    Passive exposure: Current   Smokeless tobacco: Never  Vaping Use   Vaping Use: Never used  Substance Use Topics   Alcohol use: Not Currently   Drug use: Never     Allergies   Imitrex [sumatriptan], Oxycodone, and Percocet [oxycodone-acetaminophen]   Review of Systems Review of Systems  Constitutional:  Positive for activity change. Negative for appetite change, fatigue and fever.  Gastrointestinal:  Negative for abdominal pain, diarrhea, nausea and vomiting.  Genitourinary:  Positive for frequency, vaginal discharge and vaginal pain. Negative for dysuria, urgency and vaginal bleeding.     Physical Exam Triage Vital Signs ED Triage Vitals [11/07/22 1321]  Enc Vitals Group     BP  121/81     Pulse Rate 68     Resp 18     Temp 98.6 F (37 C)     Temp Source Oral     SpO2 98 %     Weight      Height      Head Circumference      Peak Flow      Pain Score      Pain Loc      Pain Edu?      Excl. in GC?    No data found.  Updated Vital Signs BP 121/81 (BP Location: Left Arm)   Pulse 68   Temp 98.6 F (37 C) (Oral)   Resp 18   SpO2 98%   Visual Acuity Right Eye Distance:   Left Eye Distance:   Bilateral Distance:    Right Eye Near:   Left Eye Near:    Bilateral Near:     Physical Exam Vitals reviewed.  Constitutional:      General: She is awake. She is not in acute distress.    Appearance: Normal appearance. She is well-developed. She is not ill-appearing.     Comments: Very pleasant female appears stated age in no acute distress sitting comfortably in exam room   HENT:     Head: Normocephalic and atraumatic.     Mouth/Throat:     Mouth: Mucous membranes are moist.  Cardiovascular:     Rate and Rhythm: Normal rate and regular rhythm.     Heart sounds: Normal heart sounds, S1 normal and S2  normal. No murmur heard. Pulmonary:     Effort: Pulmonary effort is normal.     Breath sounds: Normal breath sounds. No wheezing, rhonchi or rales.     Comments: clear auscultation bilaterally Abdominal:     Palpations: Abdomen is soft.     Tenderness: There is no abdominal tenderness.  Musculoskeletal:     Right lower leg: No edema.     Left lower leg: No edema.  Psychiatric:        Behavior: Behavior is cooperative.      UC Treatments / Results  Labs (all labs ordered are listed, but only abnormal results are displayed) Labs Reviewed  POCT URINALYSIS DIP (MANUAL ENTRY) - Abnormal; Notable for the following components:      Result Value   Glucose, UA >=1,000 (*)    Bilirubin, UA small (*)    Ketones, POC UA trace (5) (*)    Spec Grav, UA >=1.030 (*)    Protein Ur, POC trace (*)    All other components within normal limits  POCT FASTING CBG [facility] MANUAL ENTRY - Abnormal; Notable for the following components:   POCT Glucose ([facility]) 382 (*)    All other components within normal limits  POCT URINE PREGNANCY  CERVICOVAGINAL ANCILLARY ONLY    EKG   Radiology No results found.  Procedures Procedures (including critical care time)  Medications Ordered in UC Medications - No data to display  Initial Impression / Assessment and Plan / UC Course  I have reviewed the triage vital signs and the nursing notes.  Pertinent labs & imaging results that were available during my care of the patient were reviewed by me and considered in my medical decision making (see chart for details).     Patient is well-appearing, afebrile, nontoxic, nontachycardic.  Urine pregnancy was negative.  UA did show glucosuria with high specific gravity.  No evidence of infection.  We discussed that  this is likely contributing to her recurrent vaginitis.  Random blood sugar was elevated at 382.  She had trace ketones but otherwise no evidence of ketoacidosis.  She is currently taking long-acting insulin  10 units at night so we will increase this to 12 units.  She was encouraged to monitor her fasting blood sugar and keep a log for evaluation at follow-up appointment.  She is to follow-up with her primary care soon as possible and if she is unable to see them within a week or so she is to return here for reevaluation.  We did discuss potential utility of adding metformin but she reports having reaction to this medication in the past.  She prefers to stay on the long-acting insulin for the time being until her insurance kicks in in a few days at which point she would consider additional medications such as GLP-1 agonist.  Currently these are close preoperative.  Will treat for yeast vaginitis.  3 doses of Diflucan to be spaced 72 hours apart sent to pharmacy.  STI swab was collected and we will contact her if we need to arrange treatment for bacterial vaginosis based on her swab results.  She was encouraged to wear loosefitting cotton underwear and use hypoallergenic soaps and detergents.  Offered STI testing but she declines that she no specific concern for STI exposure.  We discussed that if she has any worsening or changing symptoms including abdominal pain, nausea, vomiting, shortness of breath, pelvic pain, fever she needs to be seen emergently.  Strict return precautions given.  Patient declined excuse note.  Final Clinical Impressions(s) / UC Diagnoses   Final diagnoses:  Acute vaginitis  Type 2 diabetes mellitus with hyperglycemia, with long-term current use of insulin [facility])     Discharge Instructions      Your blood sugar is very elevated.  I suspect this is contributing to your yeast infections.  Is very important that we get this under control.  Increase your long-acting insulin to 12 units.  Monitor your blood sugar each morning before you eat and keep a log for evaluation at your next appointment.  Follow-up with your primary care soon as possible.  If you have any trouble getting in with  them within the next few weeks please return here.  Make sure that you are drinking lots of water.  Take Diflucan to treat the yeast infection.  You will take 1 dose every 3 days until your symptoms resolve or until you have completed 3 doses.  I will contact you if we need to arrange additional treatment based on your swab result.  If you have any worsening or changing symptoms including increasing discomfort or change in your discharge, nausea, vomiting, shortness of breath, confusion, weakness you need to be seen immediately.     ED Prescriptions     Medication Sig Dispense Auth. Provider   fluconazole (DIFLUCAN) 150 MG tablet Take 1 tablet (150 mg total) by mouth every 3 (three) days for 3 doses. 3 tablet Cordell Guercio K, PA-C   insulin glargine (LANTUS) 100 UNIT/ML Solostar Pen Inject 12 Units into the skin at bedtime. Increase by 2 Units daily for fasting blood sugars >150. 15 mL Jorryn Hershberger K, PA-C      PDMP not reviewed this encounter.   Jeani Hawking, PA-C 11/07/22 1400

## 2022-11-10 LAB — CERVICOVAGINAL ANCILLARY ONLY
Bacterial Vaginitis (gardnerella): NEGATIVE
Candida Glabrata: POSITIVE — AB
Candida Vaginitis: POSITIVE — AB
Comment: NEGATIVE
Comment: NEGATIVE
Comment: NEGATIVE

## 2022-12-15 ENCOUNTER — Encounter: Payer: Self-pay | Admitting: Family Medicine

## 2022-12-18 ENCOUNTER — Ambulatory Visit: Payer: Self-pay | Admitting: Student

## 2022-12-22 NOTE — Progress Notes (Unsigned)
  SUBJECTIVE:   CHIEF COMPLAINT / HPI:   Type 2 Diabetes: Home medications include: Lantus 28u***. {Blank single:19197::"Does","Does not"} endorse compliance. Home glucose monitoring {home testing:315145}.   Most recent A1Cs:  Lab Results  Component Value Date   HGBA1C 10.1 (A) 10/15/2021   HGBA1C 12.0 (H) 03/18/2021   Last Microalbumin, LDL, Creatinine: Lab Results  Component Value Date   LDLCALC 171 (H) 10/15/2021   CREATININE 0.90 11/28/2021   Patient is not up to date on diabetic eye. Patient is not up to date on diabetic foot exam.   PERTINENT  PMH / PSH: HTN, T2DM, HLD  Patient Care Team: Evette Georges, MD as PCP - General (Family Medicine) OBJECTIVE:  There were no vitals taken for this visit. Physical Exam   ASSESSMENT/PLAN:  There are no diagnoses linked to this encounter. No follow-ups on file. Shelby Mattocks, DO 12/22/2022, 8:47 PM PGY-***, [facility] {    This will disappear when note is signed, click to select method of visit    :1}

## 2022-12-23 ENCOUNTER — Other Ambulatory Visit: Payer: Self-pay

## 2022-12-23 ENCOUNTER — Ambulatory Visit (INDEPENDENT_AMBULATORY_CARE_PROVIDER_SITE_OTHER): Payer: BC Managed Care – PPO | Admitting: Student

## 2022-12-23 VITALS — BP 133/89 | HR 67 | Ht 61.0 in | Wt 168.0 lb

## 2022-12-23 DIAGNOSIS — E7800 Pure hypercholesterolemia, unspecified: Secondary | ICD-10-CM | POA: Diagnosis not present

## 2022-12-23 DIAGNOSIS — I10 Essential (primary) hypertension: Secondary | ICD-10-CM | POA: Diagnosis not present

## 2022-12-23 DIAGNOSIS — Z794 Long term (current) use of insulin: Secondary | ICD-10-CM | POA: Diagnosis not present

## 2022-12-23 DIAGNOSIS — F418 Other specified anxiety disorders: Secondary | ICD-10-CM

## 2022-12-23 DIAGNOSIS — E785 Hyperlipidemia, unspecified: Secondary | ICD-10-CM

## 2022-12-23 DIAGNOSIS — E1169 Type 2 diabetes mellitus with other specified complication: Secondary | ICD-10-CM

## 2022-12-23 DIAGNOSIS — E1165 Type 2 diabetes mellitus with hyperglycemia: Secondary | ICD-10-CM | POA: Diagnosis not present

## 2022-12-23 LAB — POCT GLYCOSYLATED HEMOGLOBIN (HGB A1C): HbA1c, POC (controlled diabetic range): 12.2 % — AB (ref 0.0–7.0)

## 2022-12-23 LAB — GLUCOSE, POCT (MANUAL RESULT ENTRY): POC Glucose: 288 mg/dl — AB (ref 70–99)

## 2022-12-23 MED ORDER — ACCU-CHEK FASTCLIX LANCET KIT
1.0000 | PACK | Freq: Every day | 1 refills | Status: AC
Start: 2022-12-23 — End: ?

## 2022-12-23 MED ORDER — LOSARTAN POTASSIUM-HCTZ 50-12.5 MG PO TABS
1.0000 | ORAL_TABLET | Freq: Every day | ORAL | 0 refills | Status: DC
Start: 2022-12-23 — End: 2023-02-18

## 2022-12-23 MED ORDER — ACCU-CHEK FASTCLIX LANCETS MISC
1.0000 | Freq: Every day | 1 refills | Status: AC
Start: 2022-12-23 — End: ?

## 2022-12-23 MED ORDER — INSULIN GLARGINE 100 UNIT/ML SOLOSTAR PEN
15.0000 [IU] | PEN_INJECTOR | Freq: Every day | SUBCUTANEOUS | 0 refills | Status: DC
Start: 2022-12-23 — End: 2023-01-06

## 2022-12-23 MED ORDER — PEN NEEDLES 3/16" 31G X 5 MM MISC
25.0000 [IU] | Freq: Every day | 1 refills | Status: AC
Start: 2022-12-23 — End: ?

## 2022-12-23 NOTE — Patient Instructions (Addendum)
It was great to see you today! Thank you for choosing [facility] for your primary care.  Today we addressed: Diabetes: I am glad you came in to see me.  I prescribed Lantus.  Please use 15 units daily and increase by 2 units every time your fasting blood sugar in the morning is greater than 150.  Once you are controlled, continue at that dosage.  I have submitted an ophthalmology referral. Hypertension: I have started you on a blood pressure medication.  If you are able, please check your blood pressures at home and use the log of provide at the bottom of this. Hyperlipidemia: I am checking your cholesterol today.  You may need to be started on a cholesterol medication.  If you haven't already, sign up for My Chart to have easy access to your labs results, and communication with your primary care physician. We are checking some labs today. If they are abnormal, I will call you. If they are normal, I will send you a MyChart message (if it is active) or a letter in the mail. If you do not hear about your labs in the next 2 weeks, please call the office. I recommend that you always bring your medications to each appointment as this makes it easy to ensure you are on the correct medications and helps [place] not miss refills when you need them. Return in about 2 weeks (around 01/06/2023) for Diabetes follow-up with Dr. Raymondo Band. Please arrive 15 minutes before your appointment to ensure smooth check in process.  We appreciate your efforts in making this happen.  Thank you for allowing me to participate in your care, Shelby Mattocks, DO 12/23/2022, 2:54 PM PGY-3, [facility]   Blood Pressure Record Sheet To take your blood pressure, you will need a blood pressure machine. You can buy a blood pressure machine (blood pressure monitor) at your clinic, drug store, or online. When choosing one, consider: An automatic monitor that has an arm cuff. A cuff that wraps snugly around your upper  arm. You should be able to fit only one finger between your arm and the cuff. A device that stores blood pressure reading results. Do not choose a monitor that measures your blood pressure from your wrist or finger. Follow your health care provider's instructions for how to take your blood pressure. To use this form: Take your blood pressure medications every day These measurements should be taken when you have been at rest for at least 10-15 min Take at least 2 readings with each blood pressure check. This makes sure the results are correct. Wait 1-2 minutes between measurements. Write down the results in the spaces on this form. Keep in mind it should always be recorded systolic over diastolic. Both numbers are important.  Repeat this every day for 2-3 weeks, or as told by your health care provider.  Make a follow-up appointment with your health care provider to discuss the results.  Blood Pressure Log Date Medications taken? (Y/N) Blood Pressure Time of Day

## 2022-12-23 NOTE — Assessment & Plan Note (Addendum)
Recheck lipid panel today as she has been noncompliant with cholesterol medication.  Given she has diabetes, will likely need to reinitiate this although attempted to just discuss HTN and diabetes today given she has just returning to medical care after 1 year hiatus secondary to insurance difficulty.

## 2022-12-23 NOTE — Assessment & Plan Note (Addendum)
BP: 133/89 today. Poorly controlled. Goal of <130/80. Continue to work on healthy dietary habits and exercise. Follow up in 2 weeks with PCP. Medication regimen: Initiate losartan-HCTZ 50-12.5 mg daily.  Blood pressure log provided.

## 2022-12-23 NOTE — Assessment & Plan Note (Addendum)
Poorly controlled, A1c 12.2.  Symptomatic with polyuria, fatigue. - Medications: Initiate Lantus 15 units, increase by 2 units every day if fasting CBG > 150 -Return in 2 weeks follow-up with Dr. Raymondo Band -BMP and urine creatinine ratio -Consider GLP-1 or SGLT2 (she does have a history of yeast vaginitis) at next visit if control is improved

## 2022-12-23 NOTE — Assessment & Plan Note (Signed)
Consider restarting Prozac or other SSRI at next visit.

## 2022-12-24 ENCOUNTER — Telehealth: Payer: Self-pay

## 2022-12-24 NOTE — Telephone Encounter (Signed)
Staff message sent to green team regarding patient's FMLA paperwork.   Per message, provider instructed that patient be informed of form completion and to make copy for chart.   Patient called, LVM and informed that forms had been completed. Copy made and placed in batch scanning. Advised that if she needs forms faxed, she will need to come into the office to sign a release of information.   Original placed at front desk for pick up.   Veronda Prude, RN

## 2022-12-25 ENCOUNTER — Encounter: Payer: Self-pay | Admitting: Student

## 2022-12-25 ENCOUNTER — Other Ambulatory Visit: Payer: Self-pay | Admitting: Student

## 2022-12-25 ENCOUNTER — Telehealth: Payer: Self-pay | Admitting: Student

## 2022-12-25 DIAGNOSIS — E785 Hyperlipidemia, unspecified: Secondary | ICD-10-CM

## 2022-12-25 MED ORDER — ROSUVASTATIN CALCIUM 20 MG PO TABS
20.0000 mg | ORAL_TABLET | Freq: Every day | ORAL | 3 refills | Status: DC
Start: 2022-12-25 — End: 2023-01-06

## 2022-12-25 NOTE — Telephone Encounter (Signed)
Left VM that I was attempting to discuss labs with her. Advised to call back and let me know if it permissible to leave results through VM.  Recommendation: Her cholesterol levels are high. I would recommend restarting a high-intensity statin medication (and also because she has diabetes).

## 2022-12-31 ENCOUNTER — Encounter: Payer: Self-pay | Admitting: Family Medicine

## 2023-01-02 ENCOUNTER — Other Ambulatory Visit: Payer: BC Managed Care – PPO

## 2023-01-05 ENCOUNTER — Ambulatory Visit (INDEPENDENT_AMBULATORY_CARE_PROVIDER_SITE_OTHER): Payer: BC Managed Care – PPO | Admitting: Family Medicine

## 2023-01-05 ENCOUNTER — Encounter: Payer: Self-pay | Admitting: Student

## 2023-01-05 ENCOUNTER — Encounter: Payer: Self-pay | Admitting: Family Medicine

## 2023-01-05 VITALS — BP 122/82 | HR 78 | Ht 61.0 in | Wt 167.0 lb

## 2023-01-05 DIAGNOSIS — E1169 Type 2 diabetes mellitus with other specified complication: Secondary | ICD-10-CM

## 2023-01-05 DIAGNOSIS — I10 Essential (primary) hypertension: Secondary | ICD-10-CM | POA: Diagnosis not present

## 2023-01-05 DIAGNOSIS — E1165 Type 2 diabetes mellitus with hyperglycemia: Secondary | ICD-10-CM

## 2023-01-05 DIAGNOSIS — F418 Other specified anxiety disorders: Secondary | ICD-10-CM | POA: Diagnosis not present

## 2023-01-05 DIAGNOSIS — Z794 Long term (current) use of insulin: Secondary | ICD-10-CM

## 2023-01-05 DIAGNOSIS — E785 Hyperlipidemia, unspecified: Secondary | ICD-10-CM

## 2023-01-05 MED ORDER — FLUOXETINE HCL 20 MG PO TABS
20.0000 mg | ORAL_TABLET | Freq: Every day | ORAL | 3 refills | Status: DC
Start: 2023-01-05 — End: 2023-01-06

## 2023-01-05 NOTE — Assessment & Plan Note (Signed)
Uncontrolled. High PHQ-9 and GAD-7 though without active SI. Will restart prozac 20 mg daily given efficacy in the past and titrate up as needed. Also provided with [email or web address] to find a therapist given her private insurance. Offered nice parks to go to in [place] given her interest in nature. Follow up in 1 month to assess improvement in mood.

## 2023-01-05 NOTE — Progress Notes (Cosign Needed Addendum)
    SUBJECTIVE:   CHIEF COMPLAINT / HPI:   T2DM Recent A1c 12.2 with polyuria and fatigue. Started on lantus 15u at that time with up-titration based on CBGs. Now on 22u daily with fasting CBG in AM around 125.  Hypercholesterolemia Elevated lipid panel at last visit. She started taking Crestor 20 mg daily yesterday since she was able to pick it up.  HTN States she just started taking losartan-hydrochlorothiazide 50-12.5 mg yesterday with her Crestor. No side effects.  Depression and anxiety Previously on prozac. Feels she needs to go back on it and find a therapist. She has been having panic attacks more recently, though she has a history of this for multiple years. Attacks will come on anytime, anywhere without much warning. States she is more stressed recently with her housing situation, her job, and her husband's healthcare needs. In the past, she had one suicide attempt at the age of 68 and states she would never do that again given her children, grandchildren, and pets. She does sometimes have passive thoughts that she would be better off not here but has never made a plan.  PERTINENT  PMH / PSH: prolactinoma, tobacco use  OBJECTIVE:   BP 122/82   Pulse 78   Ht 5\' 1"  (1.549 m)   Wt 167 lb (75.8 kg)   SpO2 98%   BMI 31.55 kg/m   General: Alert and oriented, in NAD Skin: Warm, dry, and intact HEENT: NCAT, EOM grossly normal, midline nasal septum Cardiac: Regular rate Respiratory: Breathing and speaking comfortably on RA Extremities: Moves all extremities grossly equally Neurological: No gross focal deficit Psychiatric: Appropriate mood and affect, negative PHQ-9 #9 though overall score 19 and GAD-7 score 19  ASSESSMENT/PLAN:   Essential hypertension Controlled. Continue current management and monitor at subsequent visits. She has lab appointment for updated BMP on 8/30.  Type 2 diabetes mellitus with hyperglycemia, with long-term current use of insulin (HCC) Fasting  CBGs at goal. Continue 22u Lantus daily along with lifestyle modifications. She sees Dr. Raymondo Band tomorrow, 8/27, for discussion of CGM.  Hyperlipidemia associated with type 2 diabetes mellitus (HCC) Started Crestor 20 mg yesterday, 8/25. Continue management. Can recheck lipid panel at next visit at 1 month to assess efficacy.  Depression with anxiety Uncontrolled. High PHQ-9 and GAD-7 though without active SI. Will restart prozac 20 mg daily given efficacy in the past and titrate up as needed. Also provided with [email or web address] to find a therapist given her private insurance. Offered nice parks to go to in [place] given her interest in nature. Follow up in 1 month to assess improvement in mood.   Health maintenance Address further care gaps at next visit.  Tracy Holmes, MD [facility]

## 2023-01-05 NOTE — Patient Instructions (Signed)
I have sent in a prescription for prozac 20 mg daily. Come back in 1 month to see if we need to increase the dose.  Go to [email or web address] to find a therapist that you feel comfortable with and that takes your insurance.  Go to [employer] here in [place] for some stunning [place] views and Production designer, theatre/television/film. The exercise and the views are therapeutic.  Continue your lantus and blood pressure medications. Let me know if you would like for me to send them in to [facility] in the future.

## 2023-01-05 NOTE — Assessment & Plan Note (Addendum)
Controlled. Continue current management and monitor at subsequent visits. She has lab appointment for updated BMP on 8/30.

## 2023-01-05 NOTE — Assessment & Plan Note (Signed)
Fasting CBGs at goal. Continue 22u Lantus daily along with lifestyle modifications. She sees Dr. Raymondo Band tomorrow, 8/27, for discussion of CGM.

## 2023-01-05 NOTE — Assessment & Plan Note (Signed)
Started Crestor 20 mg yesterday, 8/25. Continue management. Can recheck lipid panel at next visit at 1 month to assess efficacy.

## 2023-01-06 ENCOUNTER — Ambulatory Visit (INDEPENDENT_AMBULATORY_CARE_PROVIDER_SITE_OTHER): Payer: BC Managed Care – PPO | Admitting: Pharmacist

## 2023-01-06 ENCOUNTER — Other Ambulatory Visit: Payer: Self-pay

## 2023-01-06 ENCOUNTER — Encounter: Payer: Self-pay | Admitting: Pharmacist

## 2023-01-06 ENCOUNTER — Other Ambulatory Visit (HOSPITAL_COMMUNITY): Payer: Self-pay

## 2023-01-06 ENCOUNTER — Encounter: Payer: Self-pay | Admitting: Family Medicine

## 2023-01-06 VITALS — BP 148/89 | HR 78 | Ht 61.0 in | Wt 164.4 lb

## 2023-01-06 DIAGNOSIS — Z794 Long term (current) use of insulin: Secondary | ICD-10-CM | POA: Diagnosis not present

## 2023-01-06 DIAGNOSIS — E1165 Type 2 diabetes mellitus with hyperglycemia: Secondary | ICD-10-CM | POA: Diagnosis not present

## 2023-01-06 DIAGNOSIS — E785 Hyperlipidemia, unspecified: Secondary | ICD-10-CM

## 2023-01-06 DIAGNOSIS — E1169 Type 2 diabetes mellitus with other specified complication: Secondary | ICD-10-CM | POA: Diagnosis not present

## 2023-01-06 MED ORDER — FLUOXETINE HCL 20 MG PO TABS
20.0000 mg | ORAL_TABLET | Freq: Every day | ORAL | 3 refills | Status: AC
  Filled 2023-01-06 – 2023-02-19 (×3): qty 30, 30d supply, fill #0

## 2023-01-06 MED ORDER — ROSUVASTATIN CALCIUM 20 MG PO TABS
20.0000 mg | ORAL_TABLET | Freq: Every day | ORAL | 3 refills | Status: AC
Start: 2023-01-06 — End: ?
  Filled 2023-01-06 – 2023-01-09 (×2): qty 30, 30d supply, fill #0

## 2023-01-06 MED ORDER — INSULIN GLARGINE 100 UNIT/ML SOLOSTAR PEN
22.0000 [IU] | PEN_INJECTOR | Freq: Every morning | SUBCUTANEOUS | Status: DC
Start: 2023-01-06 — End: 2023-01-20

## 2023-01-06 MED ORDER — FREESTYLE LIBRE 3 SENSOR MISC
11 refills | Status: AC
  Filled 2023-01-06 – 2023-01-09 (×2): qty 2, 28d supply, fill #0
  Filled 2023-02-18 – 2023-02-19 (×2): qty 2, 28d supply, fill #1

## 2023-01-06 MED ORDER — SEMAGLUTIDE(0.25 OR 0.5MG/DOS) 2 MG/3ML ~~LOC~~ SOPN
0.2500 mg | PEN_INJECTOR | SUBCUTANEOUS | 1 refills | Status: DC
Start: 2023-01-06 — End: 2023-01-20
  Filled 2023-01-06 – 2023-01-09 (×2): qty 3, 28d supply, fill #0

## 2023-01-06 NOTE — Patient Instructions (Signed)
It was nice to see you today!  Your goal blood sugar is 80-130 before eating and less than 180 after eating.  Medication Changes: START Ozempic (semaglutide) 0.25mg  once weekly.  MOVE Lantus (insulin glargine) 22 units once daily in the MORNING.  If you see numbers below 120 on your Libre3 CGM, cut back on your Lantus (insulin glargine) by 2 units.  Continue all other medication the same.   Keep up the good work with diet and exercise. Aim for a diet full of vegetables, fruit and lean meats (chicken, [place], fish). Try to limit salt intake by eating fresh or frozen vegetables (instead of canned), rinse canned vegetables prior to cooking and do not add any additional salt to meals.

## 2023-01-06 NOTE — Assessment & Plan Note (Signed)
CGM -Placed FreeStyle Libre3 Professional Continuous Glucose Monitor sensor. Placed date: 01/06/23 -Advised patient to bring sensor back to clinic if it falls off early, avoid salicylic acid and vitamin C products while wearing sensor, and continue checking blood glucose and taking medications as normal.  -Informational brochure provided. -Will return to clinic in 2 weeks to assess data and adjust medications as needed. -Sent prescription for sensors to [facility]  Diabetes since November 2022 currently uncontrolled. Patient is able to verbalize appropriate hypoglycemia management plan. Medication adherence appears good. Control is suboptimal due to need for further medication management.  -Continued basal insulin Lantus (insulin glargine) 22 units daily. Moved dose from before bed to in the morning. Patient instructed to decrease by 2 units if CGM shows sugars below 120mg /dL. -Started GLP-1 Ozempic (semaglutide) 0.25mg  once weekly.  - Consider SGLT2-I Farxiga (dapagliflozin) or Jardiance (empagliflozin) once A1c is below 10 for kidney protection and further glycemic control. -Patient educated on purpose, proper use, and potential adverse effects of Ozempic (semaglutide).  -Extensively discussed pathophysiology of diabetes, recommended lifestyle interventions, dietary effects on blood sugar control.  -Counseled on s/sx of and management of hypoglycemia.

## 2023-01-06 NOTE — Progress Notes (Signed)
S:     Chief Complaint  Patient presents with   Medication Management    CGM initiation, Diabetes management   47 y.o. female who presents for diabetes evaluation, education, management and placement of FreeStyle Libre Professional Continuous Glucose Monitor sensor. Patient arrives in good spirits and presents without any assistance.  Patient was referred and last seen by Primary Care Provider, Dr. Evette Georges, on 8/13.   PMH is significant for T2DM, HTN, HLD, depression, anxiety, tobacco use.  At last visit (8/13 with PCP Dr. Evette Georges) A1c 12.2 with polyuria and fatigue. Started on lantus (insulin glargine) 15units at that time with up-titration based on CBGs. Now on 22units daily with fasting CBG in AM around 125   Patient reports Diabetes was diagnosed in November 2022.   Family/Social History: patient has 6 grandchildren  Current diabetes medications include: Lantus (insulin glargine) 22 units once daily (patient reports taking around 11pm daily) Current hypertension medications include: losartan-hydrochlorothiazide 50-12.5mg  once daily Current hyperlipidemia medications include: rosuvastatin 20mg  (patient has not picked up yet due to cost)  Patient reports adherence to taking all medications as prescribed  Do you feel that your medications are working for you? yes Have you been experiencing any side effects to the medications prescribed? yes Do you have any problems obtaining medications due to transportation or finances? Yes, finances Insurance coverage: [employer] [facility]  Patient denies hypoglycemic events since PCP visit last week with Dr. Evette Georges.   Reported home fasting blood sugars: 230s over past 2 weeks   Patient reports nocturia (nighttime urination). Used to be about 4-5 times per night, recently has been 2-3 times per night.  Patient reports neuropathy (nerve pain) in hands and feet. - Patient reports this is uncomfortable, but not  painful. Patient reports visual changes which she started to notice in 2023. - Patient has not seen eye doctor yet, but has appointment scheduled on 9/24th at 3:30pm.  Patient reports self foot exams.    O:   Review of Systems  All other systems reviewed and are negative.   Physical Exam Vitals reviewed.  Constitutional:      Appearance: Normal appearance.  Pulmonary:     Effort: Pulmonary effort is normal.  Neurological:     Mental Status: She is alert.  Psychiatric:        Mood and Affect: Mood normal.        Behavior: Behavior normal.        Thought Content: Thought content normal.        Judgment: Judgment normal.     Lab Results  Component Value Date   HGBA1C 12.2 (A) 12/23/2022   Vitals:   01/06/23 1449 01/06/23 1450  BP: (!) 141/90 (!) 148/89  Pulse: 78   SpO2: 100%     Lipid Panel     Component Value Date/Time   CHOL 246 (H) 12/23/2022 1628   TRIG 99 12/23/2022 1628   HDL 54 12/23/2022 1628   CHOLHDL 4.6 (H) 12/23/2022 1628   CHOLHDL 10.0 03/19/2021 1404   VLDL 38 03/19/2021 1404   LDLCALC 175 (H) 12/23/2022 1628    Clinical Atherosclerotic Cardiovascular Disease (ASCVD): No  The 10-year ASCVD risk score (Arnett DK, et al., 2019) is: 28.8%   Values used to calculate the score:     Age: 74 years     Sex: Female     Is Non-Hispanic African American: Yes     Diabetic: Yes     Tobacco  smoker: Yes     Systolic Blood Pressure: 148 mmHg     Is BP treated: Yes     HDL Cholesterol: 54 mg/dL     Total Cholesterol: 246 mg/dL    A/P:  CGM -Placed FreeStyle Libre3 Professional Continuous Glucose Monitor sensor. Placed date: 01/06/23 -Advised patient to bring sensor back to clinic if it falls off early, avoid salicylic acid and vitamin C products while wearing sensor, and continue checking blood glucose and taking medications as normal.  -Informational brochure provided. -Will return to clinic in 2 weeks to assess data and adjust medications as  needed. -Sent prescription for sensors to [facility]  Diabetes since November 2022 currently uncontrolled. Patient is able to verbalize appropriate hypoglycemia management plan. Medication adherence appears good. Control is suboptimal due to need for further medication management.  -Continued basal insulin Lantus (insulin glargine) 22 units daily. Moved dose from before bed to in the morning. Patient instructed to decrease by 2 units if CGM shows sugars below 120mg /dL. -Started GLP-1 Ozempic (semaglutide) 0.25mg  once weekly.  - Consider SGLT2-I Farxiga (dapagliflozin) or Jardiance (empagliflozin) once A1c is below 10 for kidney protection and further glycemic control. -Patient educated on purpose, proper use, and potential adverse effects of Ozempic (semaglutide).  -Extensively discussed pathophysiology of diabetes, recommended lifestyle interventions, dietary effects on blood sugar control.  -Counseled on s/sx of and management of hypoglycemia.  -Next A1c anticipated 3 months (November 2024).   Hyperlipidemia: ASCVD risk - primary prevention in patient with diabetes. Last LDL is 175 not at goal of <82 mg/dL. high intensity statin indicated.  -Continued rosuvastatin 20 mg once daily. Sent new prescription to [facility] where medication is on $5 list ($5/month).   Hypertension: Longstanding (since 2017) currently above goal of <130/80 mmHg. Medication adherence optimal. -Continued losartan-hydrochlorothiazide 50-12.5 mg. Repeat blood pressure at follow-up visit. Consider increasing dose of losartan-hydrochlorothiazide 100-12.5mg  or 100-25mg  if blood pressure remains elevated.  Depression/anxiety: Patient prescribed fluoxetine 20mg  once daily at PCP visit yesterday 01/05/23. Sent new prescription for fluoxetine 20mg  once daily to [facility] where it is on the $5 list ($5/month) since patient expressed financial concerns with current pharmacy  ([facility]).  Written patient instructions provided. Patient verbalized understanding of treatment plan.  Total time in face to face counseling 29 minutes.    Tobacco Use: Assess at follow up visit in 2 weeks  Follow-up:  Pharmacist 2 weeks 01/19/13 PCP clinic visit in to be determined Patient seen with Rickey Primus, PharmD Candidate and Andee Poles, PharmD Candidate.

## 2023-01-07 NOTE — Progress Notes (Signed)
Reviewed and agree with Dr Koval's plan.   

## 2023-01-09 ENCOUNTER — Other Ambulatory Visit (HOSPITAL_COMMUNITY): Payer: Self-pay

## 2023-01-09 ENCOUNTER — Other Ambulatory Visit: Payer: BC Managed Care – PPO

## 2023-01-09 ENCOUNTER — Telehealth: Payer: Self-pay

## 2023-01-09 ENCOUNTER — Other Ambulatory Visit: Payer: Self-pay

## 2023-01-09 NOTE — Telephone Encounter (Signed)
Pharmacy Patient Advocate Encounter   Received notification from Patient Advice Request messages that prior authorization for [facility] is required/requested.   Insurance verification completed.   The patient is insured through [employer] .   Per test claim: PA required; PA submitted to [facility] via CoverMyMeds Key/confirmation #/EOC [id number]. Status is pending

## 2023-01-13 ENCOUNTER — Other Ambulatory Visit (HOSPITAL_COMMUNITY): Payer: Self-pay

## 2023-01-13 NOTE — Telephone Encounter (Signed)
Pharmacy Patient Advocate Encounter  Received notification from [facility] that Prior Authorization for [facility] has been APPROVED from 01/09/23 to 01/09/24

## 2023-01-20 ENCOUNTER — Ambulatory Visit (INDEPENDENT_AMBULATORY_CARE_PROVIDER_SITE_OTHER): Payer: BC Managed Care – PPO | Admitting: Pharmacist

## 2023-01-20 ENCOUNTER — Other Ambulatory Visit (HOSPITAL_COMMUNITY): Payer: Self-pay

## 2023-01-20 ENCOUNTER — Encounter: Payer: Self-pay | Admitting: Pharmacist

## 2023-01-20 VITALS — BP 136/81 | HR 86 | Ht 61.5 in | Wt 166.2 lb

## 2023-01-20 DIAGNOSIS — Z794 Long term (current) use of insulin: Secondary | ICD-10-CM

## 2023-01-20 DIAGNOSIS — E1165 Type 2 diabetes mellitus with hyperglycemia: Secondary | ICD-10-CM | POA: Diagnosis not present

## 2023-01-20 DIAGNOSIS — I10 Essential (primary) hypertension: Secondary | ICD-10-CM | POA: Diagnosis not present

## 2023-01-20 MED ORDER — METFORMIN HCL ER 500 MG PO TB24: 500.0000 mg | ORAL_TABLET | Freq: Two times a day (BID) | ORAL | Status: AC

## 2023-01-20 MED ORDER — INSULIN GLARGINE 100 UNIT/ML SOLOSTAR PEN
30.0000 [IU] | PEN_INJECTOR | Freq: Every morning | SUBCUTANEOUS | Status: DC
Start: 2023-01-20 — End: 2023-08-31

## 2023-01-20 NOTE — Assessment & Plan Note (Signed)
Diabetes since 2022 currently uncontrolled, but improved with today's GMI of 9.1%. Patient is able to verbalize appropriate hypoglycemia management plan. Medication adherence good with medications she can afford. Control is suboptimal due to cost barrier of Ozempic - patient has not been able to pick it up from the pharmacy due to high deductible. - Increased Lantus (insulin glargine) from 28 to 30 units once daily. - Discontinued Ozempic (semaglutide) 0.25 mg once weekly. - Restarted metformin XR 500 mg twice daily. Instructed patient to take one tablet once daily and if tolerated, increased to two tablets daily. -Patient educated on purpose, proper use, and potential adverse effects.  -Extensively discussed pathophysiology of diabetes, recommended lifestyle interventions, dietary effects on blood sugar control.  -Counseled on s/sx of and management of hypoglycemia.

## 2023-01-20 NOTE — Patient Instructions (Signed)
It was nice to see you today!  Your goal blood sugar is 80-130 before eating and less than 180 after eating.  Medication Changes:  Start 1 tablet of metformin XR 500 mg once daily. If you can tolerate it well, increase to 1 tablet twice daily.  Increase Lantus (insulin glargine) to 30 units into the skin in the morning.  Please pick up the Losartan-hydrochlorothiazide from [facility].  Continue all other medication the same.   Keep up the good work with diet and exercise. Aim for a diet full of vegetables, fruit and lean meats (chicken, [place], fish). Try to limit salt intake by eating fresh or frozen vegetables (instead of canned), rinse canned vegetables prior to cooking and do not add any additional salt to meals.

## 2023-01-20 NOTE — Progress Notes (Signed)
S:     Chief Complaint  Patient presents with   Medication Management    Diabetes   47 y.o. female who presents for diabetes evaluation, education, and management. Patient arrives in good spirits and presents without any assistance.   Patient was referred and last seen by Primary Care Provider, Dr. Phineas Real, on 01/05/23.   PMH is significant for T2DM, HTN, HLD, depression, anxiety, and tobacco use.  At last visit, continued Lantus (insulin glargine) 22 units daily and patient instructed to decrease by 2 units if CGM shows sugars below 120mg /dL. Started GLP-1 Ozempic (semaglutide) 0.25mg  once weekly.   Patient reports Diabetes was diagnosed in November 2022.   Family/Social History: patient has 6 grandchildren   Current diabetes medications include: Lantus (insulin glargine) 22 units once daily (patient takes differently reports taking 28 units once daily), Did not start Ozempic (semaglutide) 0.25 mg due to cost.  (We determined she has > $2000 remaining on her deductible for the year.) Current hypertension medications include: Did not start losartan-hydrochlorothiazide 50-12.5 mg daily - plans to pick up. Current hyperlipidemia medications include: rosuvastatin 20 mg once daily  Patient reports adherence to taking all medications as prescribed.   Do you feel that your medications are working for you? yes Have you been experiencing any side effects to the medications prescribed? no Do you have any problems obtaining medications due to transportation or finances? Yes - patient could not pick up Ozempic (semaglutide) from pharmacy due to cost (high deductible) Insurance coverage: BC[employer]  Patient denies hypoglycemic events.  Patient denies nocturia (nighttime urination).  Patient reports neuropathy (nerve pain) - patient reports tingling in fingertips, but much improved. Patient denies visual changes. Patient reports self foot exams.   At home BP readings: ~ 161/101 mm Hg  O:    Review of Systems  All other systems reviewed and are negative.   Physical Exam Constitutional:      Appearance: Normal appearance.  Pulmonary:     Effort: Pulmonary effort is normal.  Neurological:     Mental Status: She is alert.  Psychiatric:        Mood and Affect: Mood normal.        Behavior: Behavior normal.        Thought Content: Thought content normal.        Judgment: Judgment normal.    7 day average blood glucose: 241 mg/dL  Libre3 CGM Download today on 01/20/23 % Time CGM is active: 96% Average Glucose: 241 mg/dL Glucose Management Indicator: 9.1%  Glucose Variability: 31.8% (goal <36%) Time in Goal:  - Time in range 70-180: 25% - Time above range: 75% - Time below range: 0% Observed patterns:   Lab Results  Component Value Date   HGBA1C 12.2 (A) 12/23/2022   Vitals:   01/20/23 1350  BP: (!) 143/90  Pulse: 87  SpO2: 100%    Lipid Panel     Component Value Date/Time   CHOL 246 (H) 12/23/2022 1628   TRIG 99 12/23/2022 1628   HDL 54 12/23/2022 1628   CHOLHDL 4.6 (H) 12/23/2022 1628   CHOLHDL 10.0 03/19/2021 1404   VLDL 38 03/19/2021 1404   LDLCALC 175 (H) 12/23/2022 1628    Clinical Atherosclerotic Cardiovascular Disease (ASCVD): No  The 10-year ASCVD risk score (Arnett DK, et al., 2019) is: 25.3%   Values used to calculate the score:     Age: 89 years     Sex: Female     Is Non-Hispanic African  American: Yes     Diabetic: Yes     Tobacco smoker: Yes     Systolic Blood Pressure: 143 mmHg     Is BP treated: Yes     HDL Cholesterol: 54 mg/dL     Total Cholesterol: 246 mg/dL   A/P: Diabetes since 1610 currently uncontrolled, but improved with today's GMI of 9.1%. Patient is able to verbalize appropriate hypoglycemia management plan. Medication adherence good with medications she can afford. Control is suboptimal due to cost barrier of Ozempic - patient has not been able to pick it up from the pharmacy due to high deductible. -  Increased Lantus (insulin glargine) from 28 to 30 units once daily. - Discontinued Ozempic (semaglutide) 0.25 mg once weekly. - Restarted metformin XR 500 mg twice daily. Instructed patient to take one tablet once daily and if tolerated, increased to two tablets daily. -Patient educated on purpose, proper use, and potential adverse effects.  -Extensively discussed pathophysiology of diabetes, recommended lifestyle interventions, dietary effects on blood sugar control.  -Counseled on s/sx of and management of hypoglycemia.   ASCVD risk - primary prevention in patient with diabetes. Last LDL is 175 not at goal of <96 mg/dL.  -Continued rosuvastatin 20 mg once daily.    -Repeat lipid evaluation next blood draw.  Hypertension longstanding currently uncontrolled with office readings of 143/90 and 136/81 mmHg. Blood pressure goal of <130/80 mmHg. Blood pressure control is suboptimal due to patient forgetting to pick up Hyzaar (losartan-hydrochlorothiazide) from pharmacy. -Plan to finally start Hyzaar (losartan-hydrochlorothiazide) 50-12.5 mg once daily as patient plans to pick up medication later today.   Written patient instructions provided. Patient verbalized understanding of treatment plan.  Total time in face to face counseling 37 minutes.    Follow-up:  Pharmacist PRN PCP clinic visit with Dr. Phineas Real on 02/10/23 Patient seen with Alesia Banda, PharmD Candidate and Andee Poles, PharmD Candidate.

## 2023-01-20 NOTE — Assessment & Plan Note (Signed)
Hypertension longstanding currently uncontrolled with office readings of 143/90 and 136/81 mmHg. Blood pressure goal of <130/80 mmHg. Blood pressure control is suboptimal due to patient forgetting to pick up Hyzaar (losartan-hydrochlorothiazide) from pharmacy. -Plan to finally start Hyzaar (losartan-hydrochlorothiazide) 50-12.5 mg once daily as patient plans to pick up medication later today.

## 2023-01-23 NOTE — Progress Notes (Signed)
Reviewed and agree with Dr Koval's plan.   

## 2023-02-10 ENCOUNTER — Ambulatory Visit: Payer: BC Managed Care – PPO | Admitting: Family Medicine

## 2023-02-18 ENCOUNTER — Other Ambulatory Visit: Payer: Self-pay | Admitting: Student

## 2023-02-18 DIAGNOSIS — I10 Essential (primary) hypertension: Secondary | ICD-10-CM

## 2023-02-19 ENCOUNTER — Other Ambulatory Visit: Payer: Self-pay

## 2023-02-19 MED ORDER — LOSARTAN POTASSIUM-HCTZ 50-12.5 MG PO TABS
1.0000 | ORAL_TABLET | Freq: Every day | ORAL | 3 refills | Status: DC
Start: 2023-02-19 — End: 2024-04-08
  Filled 2023-02-19: qty 30, 30d supply, fill #0

## 2023-02-20 ENCOUNTER — Other Ambulatory Visit: Payer: Self-pay

## 2023-02-24 ENCOUNTER — Other Ambulatory Visit: Payer: Self-pay

## 2023-03-10 ENCOUNTER — Ambulatory Visit: Payer: BC Managed Care – PPO | Admitting: Family Medicine

## 2023-06-18 ENCOUNTER — Other Ambulatory Visit: Payer: Self-pay

## 2023-06-18 ENCOUNTER — Other Ambulatory Visit (HOSPITAL_COMMUNITY): Payer: Self-pay

## 2023-08-30 ENCOUNTER — Other Ambulatory Visit: Payer: Self-pay | Admitting: Student

## 2023-08-30 DIAGNOSIS — E1165 Type 2 diabetes mellitus with hyperglycemia: Secondary | ICD-10-CM

## 2023-09-17 ENCOUNTER — Encounter: Payer: Self-pay | Admitting: Family Medicine

## 2024-02-28 ENCOUNTER — Encounter (HOSPITAL_BASED_OUTPATIENT_CLINIC_OR_DEPARTMENT_OTHER): Payer: Self-pay

## 2024-02-28 ENCOUNTER — Emergency Department (HOSPITAL_BASED_OUTPATIENT_CLINIC_OR_DEPARTMENT_OTHER): Admission: EM | Admit: 2024-02-28 | Discharge: 2024-02-28 | Disposition: A | Discharge status: Home / Self Care

## 2024-02-28 ENCOUNTER — Other Ambulatory Visit: Payer: Self-pay

## 2024-02-28 DIAGNOSIS — Z5900 Homelessness unspecified: Secondary | ICD-10-CM | POA: Insufficient documentation

## 2024-02-28 DIAGNOSIS — W57XXXA Bitten or stung by nonvenomous insect and other nonvenomous arthropods, initial encounter: Secondary | ICD-10-CM | POA: Insufficient documentation

## 2024-02-28 DIAGNOSIS — S40262A Insect bite (nonvenomous) of left shoulder, initial encounter: Secondary | ICD-10-CM | POA: Diagnosis not present

## 2024-02-28 DIAGNOSIS — Z794 Long term (current) use of insulin: Secondary | ICD-10-CM | POA: Diagnosis not present

## 2024-02-28 DIAGNOSIS — S60561A Insect bite (nonvenomous) of right hand, initial encounter: Secondary | ICD-10-CM | POA: Insufficient documentation

## 2024-02-28 DIAGNOSIS — S50862A Insect bite (nonvenomous) of left forearm, initial encounter: Secondary | ICD-10-CM | POA: Insufficient documentation

## 2024-02-28 DIAGNOSIS — S40862A Insect bite (nonvenomous) of left upper arm, initial encounter: Secondary | ICD-10-CM | POA: Diagnosis not present

## 2024-02-28 DIAGNOSIS — L089 Local infection of the skin and subcutaneous tissue, unspecified: Secondary | ICD-10-CM | POA: Diagnosis not present

## 2024-02-28 MED ORDER — PERMETHRIN 5 % EX CREA: TOPICAL_CREAM | CUTANEOUS | 0 refills | Status: AC

## 2024-02-28 MED ORDER — DIPHENHYDRAMINE HCL 25 MG PO CAPS
50.0000 mg | ORAL_CAPSULE | Freq: Once | ORAL | Status: AC
  Administered 2024-02-28: 50 mg via ORAL
  Filled 2024-02-28: qty 2

## 2024-02-28 NOTE — ED Triage Notes (Signed)
 Pt to ED with c/o insect bites of unknown etiology all over her body. Pt states she is homeless and living in her car. States her husband also lives and sleeps in the car and he doesn't have any bites. Several small red raised bumps noted, Rates pain 6/10. VSS, NADN.

## 2024-02-28 NOTE — ED Notes (Signed)
 Patient denies pain and is resting comfortably.

## 2024-02-28 NOTE — ED Provider Notes (Signed)
 [place] EMERGENCY DEPARTMENT AT [facility] Provider Note   CSN: [id number] Arrival date & time: 02/28/24  0241     Patient presents with: Insect Bite   Tracy Johns is a 48 y.o. female.   48 year old female presents for evaluation of abdominal bug bites.  States they have been there for a few days and are itchy.  States she is using hydrocortisone cream is not helping.  States she is homeless and lives in her car.  States her husband was with her bites.  She denies any other symptoms or concerns at this time.        Prior to Admission medications   Medication Sig Start Date End Date Taking? Authorizing Provider  permethrin (ELIMITE) 5 % cream Apply to affected area once 02/28/24  Yes Watt Geiler L, DO  Accu-Chek FastClix Lancets MISC 1 each by Does not apply route daily. Patient not taking: Reported on 01/20/2023 12/23/22   Orlando Pond, DO  acetaminophen  (TYLENOL ) 500 MG tablet Take 500 mg by mouth every 6 (six) hours as needed.    [provider]  albuterol  (VENTOLIN  HFA) 108 (90 Base) MCG/ACT inhaler INHALE 1 TO 2 PUFFS INTO THE LUNGS EVERY 6 HOURS AS NEEDED FOR WHEEZING OR SHORTNESS OF BREATH Patient taking differently: Inhale 1-2 puffs into the lungs every 6 (six) hours as needed. 10/11/19   Berneta Elsie Sayre, MD  blood glucose meter kit and supplies KIT Dispense based on patient and insurance preference. Use up to four times daily as directed. Patient not taking: Reported on 01/20/2023 10/15/21   Lilland, Alana, DO  Continuous Glucose Sensor (FREESTYLE LIBRE 3 SENSOR) MISC Place 1 sensor on the skin every 14 days. Use to check glucose continuously 01/06/23   McDiarmid, Krystal BIRCH, MD  FLUoxetine  (PROZAC ) 20 MG tablet Take 1 tablet (20 mg total) by mouth daily. 01/06/23   McDiarmid, Krystal BIRCH, MD  ibuprofen  (ADVIL ) 200 MG tablet Take 200 mg by mouth every 6 (six) hours as needed for mild pain.    [provider]  insulin  glargine (LANTUS   SOLOSTAR) 100 UNIT/ML Solostar Pen INJECT 15 UNITS UNDER THE SKIN EVERY NIGHT AT BEDTIME. INCREASE BY 2 UNITS DAILY FOR FASTING BLOOD SUGARS>150 08/31/23   Tharon Lung, MD  Insulin  Pen Needle (PEN NEEDLES 3/16) 31G X 5 MM MISC 25 Units by Does not apply route daily. 12/23/22   Orlando Pond, DO  Lancets Misc. (ACCU-CHEK FASTCLIX LANCET) KIT 1 each by Does not apply route daily. Patient not taking: Reported on 01/20/2023 12/23/22   Orlando Pond, DO  losartan -hydrochlorothiazide  (HYZAAR ) 50-12.5 MG tablet Take 1 tablet by mouth daily. 02/19/23   Tharon Lung, MD  rosuvastatin  (CRESTOR ) 20 MG tablet Take 1 tablet (20 mg total) by mouth daily. 01/06/23   McDiarmid, Krystal BIRCH, MD    Allergies: Imitrex  [sumatriptan ], Oxycodone , and Percocet [oxycodone -acetaminophen ]    Review of Systems  Constitutional:  Negative for chills and fever.  HENT:  Negative for ear pain and sore throat.   Eyes:  Negative for pain and visual disturbance.  Respiratory:  Negative for cough and shortness of breath.   Cardiovascular:  Negative for chest pain and palpitations.  Gastrointestinal:  Negative for abdominal pain and vomiting.  Genitourinary:  Negative for dysuria and hematuria.  Musculoskeletal:  Negative for arthralgias and back pain.  Skin:  Positive for rash. Negative for color change.  Neurological:  Negative for seizures and syncope.  All other systems reviewed and are negative.   Updated Vital  Signs BP (!) 167/95 (BP Location: Right Arm)   Pulse 90   Temp 98.3 F (36.8 C) (Oral)   Resp 16   Ht 5' 1 (1.549 m)   Wt 72.6 kg   SpO2 97%   BMI 30.23 kg/m   Physical Exam Vitals and nursing note reviewed.  Constitutional:      General: She is not in acute distress.    Appearance: Normal appearance. She is well-developed. She is not ill-appearing.  HENT:     Head: Normocephalic and atraumatic.  Eyes:     Conjunctiva/sclera: Conjunctivae normal.  Cardiovascular:     Rate and Rhythm: Normal rate and  regular rhythm.     Heart sounds: No murmur heard. Pulmonary:     Effort: Pulmonary effort is normal. No respiratory distress.     Breath sounds: Normal breath sounds.  Abdominal:     Palpations: Abdomen is soft.     Tenderness: There is no abdominal tenderness.  Musculoskeletal:        General: No swelling.     Cervical back: Neck supple.  Skin:    General: Skin is warm and dry.     Capillary Refill: Capillary refill takes less than 2 seconds.     Comments: Small areas of bug bites over hands on the right, left shoulder, left forearm  Neurological:     Mental Status: She is alert.  Psychiatric:        Mood and Affect: Mood normal.     (all labs ordered are listed, but only abnormal results are displayed) Labs Reviewed - No data to display  EKG: None  Radiology: No results found.   Procedures   Medications Ordered in the ED  diphenhydrAMINE  (BENADRYL ) capsule 50 mg (50 mg Oral Given 02/28/24 0314)                                    Medical Decision Making Social determinants of health: Patient is homeless  Patient here with blood lice, could be scabies.  Will give her Benadryl .  Advise Benadryl  as needed.  Last dose of hydrocortisone I will give prescription for permethrin cream.  Advise close follow-up with primary care and otherwise return for new or worsening symptoms.  She was comfortably discharged home.  Problems Addressed: Bug bite, initial encounter: self-limited or minor problem  Amount and/or Complexity of Data Reviewed External Data Reviewed: notes.    Details: Prior outpatient records reviewed and patient follows up regularly for diabetes  Risk OTC drugs. Prescription drug management. Diagnosis or treatment significantly limited by social determinants of health.    Final diagnoses:  Bug bite, initial encounter    ED Discharge Orders          Ordered    permethrin (ELIMITE) 5 % cream        02/28/24 0309               Ransome Helwig,  Duwaine L, DO 02/28/24 0345

## 2024-02-28 NOTE — Discharge Instructions (Signed)
 Use your permethrin as prescribed.  Take Benadryl  as needed for itching.  You can also use any other over-the-counter anti-itch cream.

## 2024-03-03 ENCOUNTER — Emergency Department (HOSPITAL_BASED_OUTPATIENT_CLINIC_OR_DEPARTMENT_OTHER)
Admission: EM | Admit: 2024-03-03 | Discharge: 2024-03-03 | Disposition: A | Discharge status: Home / Self Care | Attending: Emergency Medicine | Admitting: Emergency Medicine

## 2024-03-03 ENCOUNTER — Encounter (HOSPITAL_BASED_OUTPATIENT_CLINIC_OR_DEPARTMENT_OTHER): Payer: Self-pay | Admitting: Emergency Medicine

## 2024-03-03 ENCOUNTER — Other Ambulatory Visit: Payer: Self-pay

## 2024-03-03 DIAGNOSIS — R21 Rash and other nonspecific skin eruption: Secondary | ICD-10-CM | POA: Diagnosis not present

## 2024-03-03 MED ORDER — HYDROXYZINE HCL 25 MG PO TABS: 25.0000 mg | ORAL_TABLET | Freq: Four times a day (QID) | ORAL | 0 refills | Status: AC

## 2024-03-03 MED ORDER — DEXAMETHASONE 4 MG PO TABS
10.0000 mg | ORAL_TABLET | Freq: Once | ORAL | Status: AC
  Administered 2024-03-03: 10 mg via ORAL
  Filled 2024-03-03: qty 3

## 2024-03-03 MED ORDER — PERMETHRIN 5 % EX CREA: TOPICAL_CREAM | CUTANEOUS | 0 refills | Status: AC

## 2024-03-03 MED ORDER — HYDROXYZINE HCL 25 MG PO TABS
25.0000 mg | ORAL_TABLET | Freq: Once | ORAL | Status: AC
  Administered 2024-03-03: 25 mg via ORAL
  Filled 2024-03-03: qty 1

## 2024-03-03 MED ORDER — HYDROCORTISONE 2.5 % EX LOTN: TOPICAL_LOTION | Freq: Two times a day (BID) | CUTANEOUS | 0 refills | Status: AC

## 2024-03-03 NOTE — ED Provider Notes (Signed)
 [place] EMERGENCY DEPARTMENT AT [facility] Provider Note   CSN: [id number] Arrival date & time: 03/03/24  1755     Patient presents with: Rash   Tracy Johns is a 48 y.o. female here for evaluation of continued rash.  Seen similar 4 days ago.  Patient states she is staying at her daughter's house when she noted some bites and pruritus to her right trunk, bilateral forearms, left thigh.  Symptoms started after she slept on her daughter's couch.  She typically resides in her car.  No new lotions, perfumes or detergents.  She has not seen anything bite her.  Family numbers have not seen anything bite her either.  She thought she had bedbugs however she looked and did not see anything.  Has not seen any insects.  She has not been able to decontaminate the area where she is staying.  No IVDU, sensation of throat closing, vomiting   HPI     Prior to Admission medications   Medication Sig Start Date End Date Taking? Authorizing Provider  hydrocortisone 2.5 % lotion Apply topically 2 (two) times daily. 03/03/24  Yes Dimitrios Balestrieri A, PA-C  hydrOXYzine  (ATARAX ) 25 MG tablet Take 1 tablet (25 mg total) by mouth every 6 (six) hours. 03/03/24  Yes Tashawn Greff A, PA-C  permethrin (ELIMITE) 5 % cream Apply to affected area once 03/03/24  Yes Naylah Cork A, PA-C  Accu-Chek FastClix Lancets MISC 1 each by Does not apply route daily. Patient not taking: Reported on 01/20/2023 12/23/22   Orlando Pond, DO  acetaminophen  (TYLENOL ) 500 MG tablet Take 500 mg by mouth every 6 (six) hours as needed.    [provider]  albuterol  (VENTOLIN  HFA) 108 (90 Base) MCG/ACT inhaler INHALE 1 TO 2 PUFFS INTO THE LUNGS EVERY 6 HOURS AS NEEDED FOR WHEEZING OR SHORTNESS OF BREATH Patient taking differently: Inhale 1-2 puffs into the lungs every 6 (six) hours as needed. 10/11/19   Berneta Elsie Sayre, MD  blood glucose meter kit and supplies KIT Dispense based on patient and  insurance preference. Use up to four times daily as directed. Patient not taking: Reported on 01/20/2023 10/15/21   Lilland, Alana, DO  Continuous Glucose Sensor (FREESTYLE LIBRE 3 SENSOR) MISC Place 1 sensor on the skin every 14 days. Use to check glucose continuously 01/06/23   McDiarmid, Krystal BIRCH, MD  FLUoxetine  (PROZAC ) 20 MG tablet Take 1 tablet (20 mg total) by mouth daily. 01/06/23   McDiarmid, Krystal BIRCH, MD  ibuprofen  (ADVIL ) 200 MG tablet Take 200 mg by mouth every 6 (six) hours as needed for mild pain.    [provider]  insulin  glargine (LANTUS  SOLOSTAR) 100 UNIT/ML Solostar Pen INJECT 15 UNITS UNDER THE SKIN EVERY NIGHT AT BEDTIME. INCREASE BY 2 UNITS DAILY FOR FASTING BLOOD SUGARS>150 08/31/23   Tharon Lung, MD  Insulin  Pen Needle (PEN NEEDLES 3/16) 31G X 5 MM MISC 25 Units by Does not apply route daily. 12/23/22   Orlando Pond, DO  Lancets Misc. (ACCU-CHEK FASTCLIX LANCET) KIT 1 each by Does not apply route daily. Patient not taking: Reported on 01/20/2023 12/23/22   Orlando Pond, DO  losartan -hydrochlorothiazide  (HYZAAR ) 50-12.5 MG tablet Take 1 tablet by mouth daily. 02/19/23   Tharon Lung, MD  permethrin (ELIMITE) 5 % cream Apply to affected area once 02/28/24   Kammerer, Megan L, DO  rosuvastatin  (CRESTOR ) 20 MG tablet Take 1 tablet (20 mg total) by mouth daily. 01/06/23   McDiarmid, Krystal BIRCH, MD  Allergies: Imitrex  [sumatriptan ], Oxycodone , and Percocet [oxycodone -acetaminophen ]    Review of Systems  Constitutional: Negative.   HENT: Negative.    Respiratory: Negative.    Cardiovascular: Negative.   Gastrointestinal: Negative.   Genitourinary: Negative.   Musculoskeletal: Negative.   Skin:  Positive for rash.  Neurological: Negative.   All other systems reviewed and are negative.   Updated Vital Signs BP (!) 171/99 (BP Location: Right Arm)   Pulse 89   Temp 99.1 F (37.3 C) (Oral)   Resp 15   Ht 5' 1 (1.549 m)   Wt 72.6 kg   LMP 02/11/2024 (Approximate)    SpO2 99%   BMI 30.24 kg/m   Physical Exam Vitals and nursing note reviewed.  Constitutional:      General: She is not in acute distress.    Appearance: She is well-developed. She is not ill-appearing.  HENT:     Head: Atraumatic.  Eyes:     Pupils: Pupils are equal, round, and reactive to light.  Cardiovascular:     Rate and Rhythm: Normal rate.  Pulmonary:     Effort: No respiratory distress.  Abdominal:     General: There is no distension.  Musculoskeletal:        General: Normal range of motion.     Cervical back: Normal range of motion.  Skin:    General: Skin is warm and dry.     Comments: Small areas of pinpoint what appeared to be insect bites to left forearm, right posterior trunk, right shoulder, right thigh.  No fluctuance, induration.  Mild surrounding erythema.  No target lesions, bulla, desquamated skin.  No vesicles, purpura, petechiae.  No rash to palms, soles, mucous membrane  Neurological:     General: No focal deficit present.     Mental Status: She is alert.  Psychiatric:        Mood and Affect: Mood normal.     (all labs ordered are listed, but only abnormal results are displayed) Labs Reviewed - No data to display  EKG: None  Radiology: No results found.   Procedures   Medications Ordered in the ED  dexamethasone  (DECADRON ) tablet 10 mg (10 mg Oral Given 03/03/24 2110)  hydrOXYzine  (ATARAX ) tablet 25 mg (25 mg Oral Given 03/03/24 2110)    Here for evaluation of rash.  Patient noted started about 2 weeks ago was seen here about 5 days ago given permethrin cream initially had some improvement however still having some pruritus.  She has not been able to eradicate the area where she lives she has been staying on her daughter's couch and sleeping in her car.  She denies any history of IVDU.  Here she does appear clinically to it looks like tiny bug bites?    Patient denies any difficulty breathing or swallowing.  Pt has a patent airway without  stridor and is handling secretions without difficulty; no angioedema. No blisters, no pustules, no warmth, no draining sinus tracts, no superficial abscesses, no bullous impetigo, no vesicles, no desquamation, no target lesions with dusky purpura or a central bulla. Not tender to touch. No concern for superimposed infection. No concern for SJS, TEN, TSS, tick borne illness, syphilis or other life-threatening condition.  Discussed taking Pepcid , continue permethrin, hydroxyzine  as needed for itching as well as trying to clean the area where she is staying to ensure no other insect bites.  Will have her follow-up outpatient, return for new or worsening symptoms.  The patient has been appropriately medically  screened and/or stabilized in the ED. I have low suspicion for any other emergent medical condition which would require further screening, evaluation or treatment in the ED or require inpatient management.  Patient is hemodynamically stable and in no acute distress.  Patient able to ambulate in department prior to ED.  Evaluation does not show acute pathology that would require ongoing or additional emergent interventions while in the emergency department or further inpatient treatment.  I have discussed the diagnosis with the patient and answered all questions.  Pain is been managed while in the emergency department and patient has no further complaints prior to discharge.  Patient is comfortable with plan discussed in room and is stable for discharge at this time.  I have discussed strict return precautions for returning to the emergency department.  Patient was encouraged to follow-up with PCP/specialist refer to at discharge.                                   Medical Decision Making Amount and/or Complexity of Data Reviewed External Data Reviewed: labs, radiology and notes.  Risk OTC drugs. Prescription drug management. Decision regarding hospitalization. Diagnosis or treatment significantly  limited by social determinants of health.        Final diagnoses:  Rash    ED Discharge Orders          Ordered    permethrin (ELIMITE) 5 % cream        03/03/24 2127    hydrOXYzine  (ATARAX ) 25 MG tablet  Every 6 hours        03/03/24 2127    hydrocortisone 2.5 % lotion  2 times daily        03/03/24 2127               Shiori Adcox A, PA-C 03/03/24 2133    Ruthe Cornet, DO 03/03/24 2257

## 2024-03-03 NOTE — ED Triage Notes (Signed)
 Pt via pov from home with rash/insect bites all over her body. She was see for the same 10/19, prescribed a cream, and has had no relief. She has had these bites for 2 weeks.   Pt a&o x 4; nad noted.

## 2024-03-03 NOTE — Discharge Instructions (Addendum)
 It was a pleasure taking care of you here today.  I written you for a few medications.  Take as prescribed.  As we discussed you likely need to use a insect killer to your car in case this is the source.  Make sure to follow-up outpatient, return for any worsening symptoms

## 2024-03-21 ENCOUNTER — Other Ambulatory Visit: Payer: Self-pay

## 2024-03-21 ENCOUNTER — Encounter (HOSPITAL_BASED_OUTPATIENT_CLINIC_OR_DEPARTMENT_OTHER): Payer: Self-pay

## 2024-03-21 ENCOUNTER — Emergency Department (HOSPITAL_BASED_OUTPATIENT_CLINIC_OR_DEPARTMENT_OTHER)
Admission: EM | Admit: 2024-03-21 | Discharge: 2024-03-21 | Disposition: A | Discharge status: Home / Self Care | Attending: Emergency Medicine | Admitting: Emergency Medicine

## 2024-03-21 DIAGNOSIS — R197 Diarrhea, unspecified: Secondary | ICD-10-CM | POA: Diagnosis not present

## 2024-03-21 DIAGNOSIS — I10 Essential (primary) hypertension: Secondary | ICD-10-CM | POA: Diagnosis not present

## 2024-03-21 DIAGNOSIS — R1013 Epigastric pain: Secondary | ICD-10-CM | POA: Diagnosis not present

## 2024-03-21 DIAGNOSIS — R110 Nausea: Secondary | ICD-10-CM

## 2024-03-21 DIAGNOSIS — R112 Nausea with vomiting, unspecified: Secondary | ICD-10-CM | POA: Diagnosis not present

## 2024-03-21 LAB — HEPATIC FUNCTION PANEL
ALT: 18 U/L (ref 0–44)
AST: 17 U/L (ref 15–41)
Albumin: 4.6 g/dL (ref 3.5–5.0)
Alkaline Phosphatase: 113 U/L (ref 38–126)
Bilirubin, Direct: 0.1 mg/dL (ref 0.0–0.2)
Indirect Bilirubin: 0.3 mg/dL (ref 0.3–0.9)
Total Bilirubin: 0.4 mg/dL (ref 0.0–1.2)
Total Protein: 8.2 g/dL — ABNORMAL HIGH (ref 6.5–8.1)

## 2024-03-21 LAB — CBC WITH DIFFERENTIAL/PLATELET
Abs Immature Granulocytes: 0.02 K/uL (ref 0.00–0.07)
Basophils Absolute: 0 K/uL (ref 0.0–0.1)
Basophils Relative: 1 %
Eosinophils Absolute: 0 K/uL (ref 0.0–0.5)
Eosinophils Relative: 0 %
HCT: 36.8 % (ref 36.0–46.0)
Hemoglobin: 12 g/dL (ref 12.0–15.0)
Immature Granulocytes: 0 %
Lymphocytes Relative: 35 %
Lymphs Abs: 2.8 K/uL (ref 0.7–4.0)
MCH: 31 pg (ref 26.0–34.0)
MCHC: 32.6 g/dL (ref 30.0–36.0)
MCV: 95.1 fL (ref 80.0–100.0)
Monocytes Absolute: 0.7 K/uL (ref 0.1–1.0)
Monocytes Relative: 9 %
Neutro Abs: 4.4 K/uL (ref 1.7–7.7)
Neutrophils Relative %: 55 %
Platelets: 424 K/uL — ABNORMAL HIGH (ref 150–400)
RBC: 3.87 MIL/uL (ref 3.87–5.11)
RDW: 12.6 % (ref 11.5–15.5)
WBC: 7.9 K/uL (ref 4.0–10.5)
nRBC: 0 % (ref 0.0–0.2)

## 2024-03-21 LAB — LIPASE, BLOOD: Lipase: 18 U/L (ref 11–51)

## 2024-03-21 LAB — URINALYSIS, ROUTINE W REFLEX MICROSCOPIC
Bilirubin Urine: NEGATIVE
Glucose, UA: NEGATIVE mg/dL
Hgb urine dipstick: NEGATIVE
Ketones, ur: 40 mg/dL — AB
Leukocytes,Ua: NEGATIVE
Nitrite: NEGATIVE
Protein, ur: 30 mg/dL — AB
Specific Gravity, Urine: 1.034 — ABNORMAL HIGH (ref 1.005–1.030)
pH: 5.5 (ref 5.0–8.0)

## 2024-03-21 LAB — BASIC METABOLIC PANEL WITH GFR
Anion gap: 12 (ref 5–15)
BUN: 9 mg/dL (ref 6–20)
CO2: 26 mmol/L (ref 22–32)
Calcium: 10.1 mg/dL (ref 8.9–10.3)
Chloride: 99 mmol/L (ref 98–111)
Creatinine, Ser: 0.73 mg/dL (ref 0.44–1.00)
GFR, Estimated: >60 mL/min (ref >60)
Glucose, Bld: 88 mg/dL (ref 70–99)
Potassium: 3.7 mmol/L (ref 3.5–5.1)
Sodium: 138 mmol/L (ref 135–145)

## 2024-03-21 LAB — RESP PANEL BY RT-PCR (RSV, FLU A&B, COVID)  RVPGX2
Influenza A by PCR: NEGATIVE
Influenza B by PCR: NEGATIVE
Resp Syncytial Virus by PCR: NEGATIVE
SARS Coronavirus 2 by RT PCR: NEGATIVE

## 2024-03-21 MED ORDER — ALUM & MAG HYDROXIDE-SIMETH 200-200-20 MG/5ML PO SUSP
30.0000 mL | Freq: Once | ORAL | Status: AC
  Administered 2024-03-21: 30 mL via ORAL
  Filled 2024-03-21: qty 30

## 2024-03-21 MED ORDER — ONDANSETRON HCL 4 MG PO TABS: 4.0000 mg | ORAL_TABLET | Freq: Four times a day (QID) | ORAL | 0 refills | Status: AC

## 2024-03-21 MED ORDER — ONDANSETRON 4 MG PO TBDP
4.0000 mg | ORAL_TABLET | Freq: Once | ORAL | Status: AC
  Administered 2024-03-21: 4 mg via ORAL
  Filled 2024-03-21: qty 1

## 2024-03-21 MED ORDER — PANTOPRAZOLE SODIUM 20 MG PO TBEC
20.0000 mg | DELAYED_RELEASE_TABLET | Freq: Every day | ORAL | 0 refills | Status: AC
Start: 2024-03-21 — End: ?

## 2024-03-21 MED ORDER — FAMOTIDINE 20 MG PO TABS: 20.0000 mg | ORAL_TABLET | Freq: Two times a day (BID) | ORAL | 0 refills | Status: AC

## 2024-03-21 MED ORDER — PANTOPRAZOLE SODIUM 40 MG PO TBEC
40.0000 mg | DELAYED_RELEASE_TABLET | Freq: Once | ORAL | Status: AC
  Administered 2024-03-21: 40 mg via ORAL
  Filled 2024-03-21: qty 1

## 2024-03-21 MED ORDER — FAMOTIDINE 20 MG PO TABS
20.0000 mg | ORAL_TABLET | Freq: Once | ORAL | Status: AC
  Administered 2024-03-21: 20 mg via ORAL
  Filled 2024-03-21: qty 1

## 2024-03-21 NOTE — Discharge Instructions (Signed)
 You were seen today for nausea and epigastric discomfort as well as diarrhea.  Your lab work and imaging today were very reassuring that low suspicion for any emergent cause recent today.  Recommend you continue to follow-up with your PCP for further monitoring and management.  Your blood pressure  today was elevated, would recommend to continue to take blood pressure at home and when going to PCP provide log.  I am sending in some nausea medication as well as your Pepcid  and Protonix .  Return to the ER for any new or worsening symptoms.

## 2024-03-21 NOTE — ED Provider Notes (Signed)
 [place] EMERGENCY DEPARTMENT AT [facility] Provider Note   CSN: [id number] Arrival date & time: 03/21/24  8073     Patient presents with: Nausea and Emesis   Tracy Johns is a 48 y.o. female.    Emesis Associated symptoms: abdominal pain   Patient is a 48 year old female to the ED today for concerns for epigastric pain, nausea x 3 days as well as watery diarrhea last night.  Notes that she has not been able to eat or drink very much last couple of days secondary to lack of appetite and egg taste in her mouth.  Has been able to tolerate fluids.    Taking Nexium and Pepcid  with relief.  Previous medical history of HTN, nephrolithiasis, pyelonephritis, pseudoseizures.  Currently has no to the area and does not have PCP at this time.  Endorses headache, similar to her previous migraines, pulsatile, right-sided with light and sound sensitivity.  Denies fever, blurry vision, vertigo, tinnitus, otalgia, dysphagia, odynophagia, cough, congestion, chest pain, shortness breath, vomiting, melena, hematochezia, dysuria, vaginal discharge, vaginal bleeding, lower leg swelling, rashes.  Prior to Admission medications   Medication Sig Start Date End Date Taking? Authorizing Provider  famotidine  (PEPCID ) 20 MG tablet Take 1 tablet (20 mg total) by mouth 2 (two) times daily. 03/21/24  Yes Rahmir Beever S, PA-C  ondansetron  (ZOFRAN ) 4 MG tablet Take 1 tablet (4 mg total) by mouth every 6 (six) hours. 03/21/24  Yes Beola Terrall RAMAN, PA-C  pantoprazole  (PROTONIX ) 20 MG tablet Take 1 tablet (20 mg total) by mouth daily. 03/21/24  Yes Beola Terrall RAMAN, PA-C  Accu-Chek FastClix Lancets MISC 1 each by Does not apply route daily. Patient not taking: Reported on 01/20/2023 12/23/22   Orlando Pond, DO  acetaminophen  (TYLENOL ) 500 MG tablet Take 500 mg by mouth every 6 (six) hours as needed.    [provider]  albuterol  (VENTOLIN  HFA) 108 (90 Base) MCG/ACT inhaler INHALE 1  TO 2 PUFFS INTO THE LUNGS EVERY 6 HOURS AS NEEDED FOR WHEEZING OR SHORTNESS OF BREATH Patient taking differently: Inhale 1-2 puffs into the lungs every 6 (six) hours as needed. 10/11/19   Berneta Elsie Sayre, MD  blood glucose meter kit and supplies KIT Dispense based on patient and insurance preference. Use up to four times daily as directed. Patient not taking: Reported on 01/20/2023 10/15/21   Lilland, Alana, DO  Continuous Glucose Sensor (FREESTYLE LIBRE 3 SENSOR) MISC Place 1 sensor on the skin every 14 days. Use to check glucose continuously 01/06/23   McDiarmid, Krystal BIRCH, MD  FLUoxetine  (PROZAC ) 20 MG tablet Take 1 tablet (20 mg total) by mouth daily. 01/06/23   McDiarmid, Krystal BIRCH, MD  hydrocortisone 2.5 % lotion Apply topically 2 (two) times daily. 03/03/24   Henderly, Britni A, PA-C  hydrOXYzine  (ATARAX ) 25 MG tablet Take 1 tablet (25 mg total) by mouth every 6 (six) hours. 03/03/24   Henderly, Britni A, PA-C  ibuprofen  (ADVIL ) 200 MG tablet Take 200 mg by mouth every 6 (six) hours as needed for mild pain.    [provider]  insulin  glargine (LANTUS  SOLOSTAR) 100 UNIT/ML Solostar Pen INJECT 15 UNITS UNDER THE SKIN EVERY NIGHT AT BEDTIME. INCREASE BY 2 UNITS DAILY FOR FASTING BLOOD SUGARS>150 08/31/23   Tharon Lung, MD  Insulin  Pen Needle (PEN NEEDLES 3/16) 31G X 5 MM MISC 25 Units by Does not apply route daily. 12/23/22   Orlando Pond, DO  Lancets Misc. (ACCU-CHEK FASTCLIX LANCET) KIT 1 each by Does  not apply route daily. Patient not taking: Reported on 01/20/2023 12/23/22   Orlando Pond, DO  losartan -hydrochlorothiazide  (HYZAAR ) 50-12.5 MG tablet Take 1 tablet by mouth daily. 02/19/23   Tharon Lung, MD  permethrin (ELIMITE) 5 % cream Apply to affected area once 02/28/24   Kammerer, Megan L, DO  permethrin (ELIMITE) 5 % cream Apply to affected area once 03/03/24   Henderly, Britni A, PA-C  rosuvastatin  (CRESTOR ) 20 MG tablet Take 1 tablet (20 mg total) by mouth daily. 01/06/23    McDiarmid, Krystal BIRCH, MD    Allergies: Imitrex  [sumatriptan ], Oxycodone , and Percocet [oxycodone -acetaminophen ]    Review of Systems  Gastrointestinal:  Positive for abdominal pain and nausea.  All other systems reviewed and are negative.   Updated Vital Signs BP (!) 171/87   Pulse 74   Temp 98.4 F (36.9 C) (Oral)   Resp 14   Ht 5' 1 (1.549 m)   Wt 72.6 kg   LMP 02/11/2024 (Approximate)   SpO2 99%   BMI 30.23 kg/m   Physical Exam Vitals and nursing note reviewed.  Constitutional:      General: She is not in acute distress.    Appearance: Normal appearance. She is not ill-appearing or diaphoretic.  HENT:     Head: Normocephalic and atraumatic.     Mouth/Throat:     Mouth: Mucous membranes are moist.     Pharynx: Oropharynx is clear. No oropharyngeal exudate or posterior oropharyngeal erythema.  Eyes:     General: No scleral icterus.       Right eye: No discharge.        Left eye: No discharge.     Extraocular Movements: Extraocular movements intact.     Conjunctiva/sclera: Conjunctivae normal.     Pupils: Pupils are equal, round, and reactive to light.  Cardiovascular:     Rate and Rhythm: Normal rate and regular rhythm.     Pulses: Normal pulses.     Heart sounds: Normal heart sounds. No murmur heard.    No friction rub. No gallop.  Pulmonary:     Effort: Pulmonary effort is normal. No respiratory distress.     Breath sounds: No stridor. No wheezing, rhonchi or rales.  Chest:     Chest wall: No tenderness.  Abdominal:     General: Abdomen is flat. There is no distension.     Palpations: Abdomen is soft.     Tenderness: There is abdominal tenderness (Mild epigastric tenderness to palpation.). There is no right CVA tenderness, left CVA tenderness, guarding or rebound.  Musculoskeletal:        General: No swelling, deformity or signs of injury.     Cervical back: Normal range of motion. No rigidity.     Right lower leg: No edema.     Left lower leg: No edema.   Skin:    General: Skin is warm and dry.     Findings: No bruising, erythema or lesion.  Neurological:     General: No focal deficit present.     Mental Status: She is alert and oriented to person, place, and time. Mental status is at baseline.     Cranial Nerves: No cranial nerve deficit.     Sensory: No sensory deficit.     Motor: No weakness.     Coordination: Coordination normal.  Psychiatric:        Mood and Affect: Mood normal.     (all labs ordered are listed, but only abnormal results are displayed) Labs Reviewed  CBC WITH DIFFERENTIAL/PLATELET - Abnormal; Notable for the following components:      Result Value   Platelets 424 (*)    All other components within normal limits  HEPATIC FUNCTION PANEL - Abnormal; Notable for the following components:   Total Protein 8.2 (*)    All other components within normal limits  URINALYSIS, ROUTINE W REFLEX MICROSCOPIC - Abnormal; Notable for the following components:   Specific Gravity, Urine 1.034 (*)    Ketones, ur 40 (*)    Protein, ur 30 (*)    Bacteria, UA RARE (*)    All other components within normal limits  RESP PANEL BY RT-PCR (RSV, FLU A&B, COVID)  RVPGX2  BASIC METABOLIC PANEL WITH GFR  LIPASE, BLOOD    EKG: None  Radiology: No results found.   Procedures   Medications Ordered in the ED  famotidine  (PEPCID ) tablet 20 mg (20 mg Oral Given 03/21/24 2244)  pantoprazole  (PROTONIX ) EC tablet 40 mg (40 mg Oral Given 03/21/24 2244)  alum & mag hydroxide-simeth (MAALOX/MYLANTA) 200-200-20 MG/5ML suspension 30 mL (30 mLs Oral Given 03/21/24 2244)  ondansetron  (ZOFRAN -ODT) disintegrating tablet 4 mg (4 mg Oral Given 03/21/24 2248)      Medical Decision Making Amount and/or Complexity of Data Reviewed Labs: ordered.  Risk OTC drugs. Prescription drug management.   This patient is a 48 year old female who presents to the ED for concern of 2-day history of epigastric discomfort with diarrhea yesterday and  nausea with decreased appetite, noting that she had minimal eating and fluid intake for the last 3 days.  Reports that she tasted eggs in her burps as well as had some stomach cramping.  On physical exam, patient is in no acute distress, afebrile, alert and orient x 4, speaking in full sentences, nontachypneic, nontachycardic.  LCTAB, RRR, no murmur.  No lower leg edema.  No CVA tenderness.  Patient does have some mild epigastric tenderness to palpation.  Exam is otherwise unremarkable with patient well-appearing.  Patient tolerating p.o. and fluid challenge.  Patient is well-appearing and says that her symptoms are now abated with nausea medication and Pepcid  and Protonix .  Will have her continue follow-up with PCP to establish care.  As well as return to the ER for any new or worsening symptoms.  Patient vital signs have remained stable throughout the course of patient's time in the ED. Low suspicion for any other emergent pathology at this time. I believe this patient is safe to be discharged. Provided strict return to ER precautions. Patient expressed agreement and understanding of plan. All questions were answered.  Differential diagnoses prior to evaluation: The emergent differential diagnosis includes, but is not limited to,  PUD, gastritis, pancreatitis, gastroparesis, malignancy, biliary disease, ACS, pericarditis, pneumonia, intestinal ischemia, esophageal rupture, hepatitis  . This is not an exhaustive differential.   Past Medical History / Co-morbidities / Social History: HTN, migraines, hyperprolactinemia, pseudoseizures, nephrolithiasis, DKA, HTN  Additional history: Chart reviewed. Pertinent results include:   Last seen in the emergency department on 03/03/2024 for rash.  Lab Tests/Imaging studies: I personally interpreted labs/imaging and the pertinent results include:   CBC notes a mildly elevated platelets of 424 otherwise unremarkable BMP unremarkable Hepatic function  panel unremarkable UA notes ketones and elevated gravity likely secondary to dehydration Lipase unremarkable Rester panel unremarkable   Medications: I ordered medication including Pepcid , Protonix , Maalox, Zofran .  I have reviewed the patients home medicines and have made adjustments as needed.  Critical Interventions: None  Social Determinants of Health:  Does not have a PCP as she had recently moved to the area again  Disposition: After consideration of the diagnostic results and the patients response to treatment, I feel that the patient would benefit from discharge and treatment as above.   emergency department workup does not suggest an emergent condition requiring admission or immediate intervention beyond what has been performed at this time. The plan is: Follow-up and establish care with PCP, symptomatic management at home, return to ED for new or worsening symptoms. The patient is safe for discharge and has been instructed to return immediately for worsening symptoms, change in symptoms or any other concerns.   Final diagnoses:  Nausea  Epigastric pain  Diarrhea, unspecified type    ED Discharge Orders          Ordered    famotidine  (PEPCID ) 20 MG tablet  2 times daily        03/21/24 2310    pantoprazole  (PROTONIX ) 20 MG tablet  Daily        03/21/24 2310    ondansetron  (ZOFRAN ) 4 MG tablet  Every 6 hours        03/21/24 2310               Beola Terrall RAMAN, PA-C 03/22/24 0002    Yolande Lamar BROCKS, MD 03/23/24 1050

## 2024-03-21 NOTE — ED Notes (Signed)
 Pt given crackers and ginger ale and tolerated well. Provider aware

## 2024-03-21 NOTE — ED Notes (Signed)
 Lab called to add hepatic fx and lipase

## 2024-03-21 NOTE — ED Triage Notes (Addendum)
 Pt states these symptoms started Thursday N/V/D, no vomiting or diarrhea today but feels that it is coming back Hx of GERD and has taken meds nexium and pepcid  +HA Has been off meds for 6 months Just now received MCD and need to find a PCP

## 2024-04-08 ENCOUNTER — Encounter (HOSPITAL_BASED_OUTPATIENT_CLINIC_OR_DEPARTMENT_OTHER): Payer: Self-pay | Admitting: Emergency Medicine

## 2024-04-08 ENCOUNTER — Other Ambulatory Visit: Payer: Self-pay

## 2024-04-08 ENCOUNTER — Emergency Department (HOSPITAL_BASED_OUTPATIENT_CLINIC_OR_DEPARTMENT_OTHER)
Admission: EM | Admit: 2024-04-08 | Discharge: 2024-04-08 | Disposition: A | Discharge status: Home / Self Care | Attending: Emergency Medicine | Admitting: Emergency Medicine

## 2024-04-08 DIAGNOSIS — R519 Headache, unspecified: Secondary | ICD-10-CM

## 2024-04-08 DIAGNOSIS — Z79899 Other long term (current) drug therapy: Secondary | ICD-10-CM | POA: Diagnosis not present

## 2024-04-08 DIAGNOSIS — I10 Essential (primary) hypertension: Secondary | ICD-10-CM | POA: Insufficient documentation

## 2024-04-08 LAB — PREGNANCY, URINE: Preg Test, Ur: NEGATIVE

## 2024-04-08 LAB — CBC
HCT: 37.9 % (ref 36.0–46.0)
Hemoglobin: 12.4 g/dL (ref 12.0–15.0)
MCH: 30.8 pg (ref 26.0–34.0)
MCHC: 32.7 g/dL (ref 30.0–36.0)
MCV: 94 fL (ref 80.0–100.0)
Platelets: 421 K/uL — ABNORMAL HIGH (ref 150–400)
RBC: 4.03 MIL/uL (ref 3.87–5.11)
RDW: 12 % (ref 11.5–15.5)
WBC: 8.3 K/uL (ref 4.0–10.5)
nRBC: 0 % (ref 0.0–0.2)

## 2024-04-08 LAB — TROPONIN T, HIGH SENSITIVITY
Troponin T High Sensitivity: <15 ng/L (ref 0–19)
Troponin T High Sensitivity: <15 ng/L (ref 0–19)

## 2024-04-08 LAB — BASIC METABOLIC PANEL WITH GFR
Anion gap: 12 (ref 5–15)
BUN: 9 mg/dL (ref 6–20)
CO2: 27 mmol/L (ref 22–32)
Calcium: 10.1 mg/dL (ref 8.9–10.3)
Chloride: 99 mmol/L (ref 98–111)
Creatinine, Ser: 0.82 mg/dL (ref 0.44–1.00)
GFR, Estimated: >60 mL/min (ref >60)
Glucose, Bld: 186 mg/dL — ABNORMAL HIGH (ref 70–99)
Potassium: 3.7 mmol/L (ref 3.5–5.1)
Sodium: 138 mmol/L (ref 135–145)

## 2024-04-08 LAB — CBG MONITORING, ED: Glucose-Capillary: 173 mg/dL — ABNORMAL HIGH (ref 70–99)

## 2024-04-08 MED ORDER — LOSARTAN POTASSIUM-HCTZ 50-12.5 MG PO TABS: 1.0000 | ORAL_TABLET | Freq: Every day | ORAL | 2 refills | Status: AC

## 2024-04-08 MED ORDER — KETOROLAC TROMETHAMINE 15 MG/ML IJ SOLN
15.0000 mg | Freq: Once | INTRAMUSCULAR | Status: AC
  Administered 2024-04-08: 15 mg via INTRAVENOUS
  Filled 2024-04-08: qty 1

## 2024-04-08 MED ORDER — LACTATED RINGERS IV BOLUS
1000.0000 mL | Freq: Once | INTRAVENOUS | Status: AC
  Administered 2024-04-08: 1000 mL via INTRAVENOUS

## 2024-04-08 MED ORDER — METOCLOPRAMIDE HCL 5 MG/ML IJ SOLN
10.0000 mg | Freq: Once | INTRAMUSCULAR | Status: AC
  Administered 2024-04-08: 10 mg via INTRAVENOUS
  Filled 2024-04-08: qty 2

## 2024-04-08 MED ORDER — DIPHENHYDRAMINE HCL 50 MG/ML IJ SOLN
25.0000 mg | Freq: Once | INTRAMUSCULAR | Status: AC
  Administered 2024-04-08: 25 mg via INTRAVENOUS
  Filled 2024-04-08: qty 1

## 2024-04-08 NOTE — Discharge Instructions (Addendum)
 We are represcribing you your blood pressure medicine that you used to be on.  You need to follow-up urgently with an outpatient primary care provider for both your blood pressure as well as general medical care.  If you develop new or worsening headache, chest pain, shortness of breath, or any other new/concerning symptoms then return to the ER.

## 2024-04-08 NOTE — ED Triage Notes (Signed)
 Pt via pov from home with hypertension, chills, and shakes, presumably from hypoglycemia. Pt has been seen for the same and is trying to get established with a pcp. She has had a headache since this morning. She reports that she has not been able to get HTN meds in a long time due to no PCP. Pt a&o x 4; nad noted.

## 2024-04-08 NOTE — ED Provider Notes (Signed)
 [facility] Provider Note   CSN: [id number] Arrival date & time: 04/08/24  1645     Patient presents with: Hypertension   Tracy Johns is a 48 y.o. female.   HPI 48 year old female presents with a chief complaint of headache and hypertension.  She has a history of hypertension and is supposed to be on meds but due to recently moving from [place]  back to [place]  and due to insurance issues she has not been on meds for several months.  She has not been on diabetic meds either.  She has been having on and off headaches that she attributes to high blood pressure over the last month.  She has a history of migraines but these are a little different.  Typically is left sided forehead near her eye and then becomes diffuse.  Blood pressures are often elevated.  This occurred today when she first woke up she noticed a mild to moderate left-sided headache.  Has progressively worsened, especially after taking a nap this afternoon.  No thunderclap component.  Headache is now an 8-9 out of 10.  Mostly frontal.  No vision changes, fever, neck stiffness, vomiting, weakness or numbness in her extremities.  No chest pain but she feels all over weak and has been feeling that way all day.  Blood pressures have been in the 160/100 range when she has checked it at home.  She also felt shaky today and thought it might be a low blood sugar problem but did not have a way to check her blood sugar and so she ate a banana instead.  Prior to Admission medications   Medication Sig Start Date End Date Taking? Authorizing Provider  Accu-Chek FastClix Lancets MISC 1 each by Does not apply route daily. Patient not taking: Reported on 01/20/2023 12/23/22   Orlando Pond, DO  acetaminophen  (TYLENOL ) 500 MG tablet Take 500 mg by mouth every 6 (six) hours as needed.    [provider]  albuterol  (VENTOLIN  HFA) 108 (90 Base) MCG/ACT inhaler INHALE 1 TO 2 PUFFS  INTO THE LUNGS EVERY 6 HOURS AS NEEDED FOR WHEEZING OR SHORTNESS OF BREATH Patient taking differently: Inhale 1-2 puffs into the lungs every 6 (six) hours as needed. 10/11/19   Berneta Elsie Sayre, MD  blood glucose meter kit and supplies KIT Dispense based on patient and insurance preference. Use up to four times daily as directed. Patient not taking: Reported on 01/20/2023 10/15/21   Lilland, Alana, DO  Continuous Glucose Sensor (FREESTYLE LIBRE 3 SENSOR) MISC Place 1 sensor on the skin every 14 days. Use to check glucose continuously 01/06/23   McDiarmid, Krystal BIRCH, MD  famotidine  (PEPCID ) 20 MG tablet Take 1 tablet (20 mg total) by mouth 2 (two) times daily. 03/21/24   Bauer, Collin S, PA-C  FLUoxetine  (PROZAC ) 20 MG tablet Take 1 tablet (20 mg total) by mouth daily. 01/06/23   McDiarmid, Krystal BIRCH, MD  hydrocortisone  2.5 % lotion Apply topically 2 (two) times daily. 03/03/24   Henderly, Britni A, PA-C  hydrOXYzine  (ATARAX ) 25 MG tablet Take 1 tablet (25 mg total) by mouth every 6 (six) hours. 03/03/24   Henderly, Britni A, PA-C  ibuprofen  (ADVIL ) 200 MG tablet Take 200 mg by mouth every 6 (six) hours as needed for mild pain.    [provider]  insulin  glargine (LANTUS  SOLOSTAR) 100 UNIT/ML Solostar Pen INJECT 15 UNITS UNDER THE SKIN EVERY NIGHT AT BEDTIME. INCREASE BY 2 UNITS DAILY FOR  FASTING BLOOD SUGARS>150 08/31/23   Tharon Lung, MD  Insulin  Pen Needle (PEN NEEDLES 3/16) 31G X 5 MM MISC 25 Units by Does not apply route daily. 12/23/22   Orlando Pond, DO  Lancets Misc. (ACCU-CHEK FASTCLIX LANCET) KIT 1 each by Does not apply route daily. Patient not taking: Reported on 01/20/2023 12/23/22   Orlando Pond, DO  losartan -hydrochlorothiazide  (HYZAAR ) 50-12.5 MG tablet Take 1 tablet by mouth daily. 04/08/24   Freddi Hamilton, MD  ondansetron  (ZOFRAN ) 4 MG tablet Take 1 tablet (4 mg total) by mouth every 6 (six) hours. 03/21/24   Bauer, Collin S, PA-C  pantoprazole  (PROTONIX ) 20 MG tablet Take 1  tablet (20 mg total) by mouth daily. 03/21/24   Bauer, Collin S, PA-C  permethrin  (ELIMITE ) 5 % cream Apply to affected area once 02/28/24   Kammerer, Megan L, DO  permethrin  (ELIMITE ) 5 % cream Apply to affected area once 03/03/24   Henderly, Britni A, PA-C  rosuvastatin  (CRESTOR ) 20 MG tablet Take 1 tablet (20 mg total) by mouth daily. 01/06/23   McDiarmid, Krystal BIRCH, MD    Allergies: Imitrex  [sumatriptan ], Oxycodone , and Percocet [oxycodone -acetaminophen ]    Review of Systems  Constitutional:  Negative for fever.  Eyes:  Negative for visual disturbance.  Respiratory:  Negative for shortness of breath.   Cardiovascular:  Negative for chest pain.  Gastrointestinal:  Negative for vomiting.  Musculoskeletal:  Negative for neck stiffness.  Neurological:  Positive for headaches. Negative for weakness and numbness.    Updated Vital Signs BP (!) 172/92   Pulse 63   Temp (!) 97.5 F (36.4 C)   Resp 18   Ht 5' 1 (1.549 m)   Wt 72.6 kg   SpO2 97%   BMI 30.24 kg/m   Physical Exam Vitals and nursing note reviewed.  Constitutional:      General: She is not in acute distress.    Appearance: She is well-developed. She is not ill-appearing or diaphoretic.  HENT:     Head: Normocephalic and atraumatic.  Eyes:     Extraocular Movements: Extraocular movements intact.     Pupils: Pupils are equal, round, and reactive to light.  Cardiovascular:     Rate and Rhythm: Normal rate and regular rhythm.     Heart sounds: Normal heart sounds.  Pulmonary:     Effort: Pulmonary effort is normal.     Breath sounds: Normal breath sounds.  Abdominal:     General: There is no distension.     Palpations: Abdomen is soft.     Tenderness: There is no abdominal tenderness.  Musculoskeletal:     Cervical back: No rigidity.  Skin:    General: Skin is warm and dry.  Neurological:     Mental Status: She is alert.     Comments: CN 3-12 grossly intact. 5/5 strength in all 4 extremities. Grossly normal  sensation. Normal finger to nose.      (all labs ordered are listed, but only abnormal results are displayed) Labs Reviewed  BASIC METABOLIC PANEL WITH GFR - Abnormal; Notable for the following components:      Result Value   Glucose, Bld 186 (*)    All other components within normal limits  CBC - Abnormal; Notable for the following components:   Platelets 421 (*)    All other components within normal limits  CBG MONITORING, ED - Abnormal; Notable for the following components:   Glucose-Capillary 173 (*)    All other components within normal limits  PREGNANCY, URINE  TROPONIN T, HIGH SENSITIVITY  TROPONIN T, HIGH SENSITIVITY    EKG: EKG Interpretation Date/Time:  Friday April 08 2024 19:42:31 EST Ventricular Rate:  71 PR Interval:  132 QRS Duration:  88 QT Interval:  395 QTC Calculation: 430 R Axis:   88  Text Interpretation: Sinus rhythm Low voltage, precordial leads no acute ST/T changes similar to July 2023 Confirmed by Freddi Hamilton [phone]) on 04/08/2024 7:53:15 PM  Radiology: No results found.   Procedures   Medications Ordered in the ED  metoCLOPramide  (REGLAN ) injection 10 mg (10 mg Intravenous Given 04/08/24 1936)  ketorolac  (TORADOL ) 15 MG/ML injection 15 mg (15 mg Intravenous Given 04/08/24 1935)  diphenhydrAMINE  (BENADRYL ) injection 25 mg (25 mg Intravenous Given 04/08/24 1934)  lactated ringers  bolus 1,000 mL (0 mLs Intravenous Stopped 04/08/24 2106)                                    Medical Decision Making Amount and/or Complexity of Data Reviewed External Data Reviewed: notes. Labs: ordered.    Details: Normal troponin x 2, mild hyperglycemia ECG/medicine tests: ordered and independent interpretation performed.    Details: No ischemia  Risk Prescription drug management.   Patient presents with headache.  Has had this headache on and off for a while and she attributes this to her blood pressure.  No red flags on history or exam.  She was  given Reglan , Benadryl , Toradol , fluids and now her headache is gone.  Highly doubt acute CNS emergency such as mass, bleeding, infection.  No meningismus.  No chest pain though felt all over vaguely weak and troponins obtained in triage are negative.  No ischemia on ECG.  Will restart her home meds which appears to be a combination pill (Hyzaar ).  Otherwise we will have her follow-up with the PCP.  She is mildly hyperglycemic, she states she cannot tolerate metformin  and she will need to follow-up with PCP for further treatment for this.  Otherwise appears stable for discharge home with return precautions.     Final diagnoses:  Frontal headache  Uncontrolled hypertension    ED Discharge Orders          Ordered    losartan -hydrochlorothiazide  (HYZAAR ) 50-12.5 MG tablet  Daily        04/08/24 2054               Freddi Hamilton, MD 04/08/24 2307
